# Patient Record
Sex: Male | Born: 1935 | Race: White | Hispanic: No | Marital: Single | Smoking: Former smoker
Health system: Southern US, Community
[De-identification: ages and names within clinical notes are randomized; demographics above are authoritative.]

## PROBLEM LIST (undated history)

## (undated) DIAGNOSIS — E785 Hyperlipidemia, unspecified: Secondary | ICD-10-CM

## (undated) DIAGNOSIS — I509 Heart failure, unspecified: Secondary | ICD-10-CM

## (undated) DIAGNOSIS — E669 Obesity, unspecified: Secondary | ICD-10-CM

## (undated) DIAGNOSIS — I10 Essential (primary) hypertension: Secondary | ICD-10-CM

## (undated) DIAGNOSIS — E079 Disorder of thyroid, unspecified: Secondary | ICD-10-CM

## (undated) DIAGNOSIS — K828 Other specified diseases of gallbladder: Secondary | ICD-10-CM

## (undated) DIAGNOSIS — I2510 Atherosclerotic heart disease of native coronary artery without angina pectoris: Secondary | ICD-10-CM

## (undated) DIAGNOSIS — K219 Gastro-esophageal reflux disease without esophagitis: Secondary | ICD-10-CM

## (undated) DIAGNOSIS — R911 Solitary pulmonary nodule: Secondary | ICD-10-CM

## (undated) HISTORY — DX: Atherosclerotic heart disease of native coronary artery without angina pectoris: I25.10

## (undated) HISTORY — DX: Gastro-esophageal reflux disease without esophagitis: K21.9

## (undated) HISTORY — DX: Disorder of thyroid, unspecified: E07.9

## (undated) HISTORY — DX: Other specified diseases of gallbladder: K82.8

## (undated) HISTORY — DX: Obesity, unspecified: E66.9

## (undated) HISTORY — DX: Heart failure, unspecified: I50.9

## (undated) HISTORY — DX: Solitary pulmonary nodule: R91.1

## (undated) HISTORY — DX: Hyperlipidemia, unspecified: E78.5

## (undated) HISTORY — DX: Essential (primary) hypertension: I10

---

## 1998-06-05 ENCOUNTER — Encounter: Payer: Self-pay | Admitting: Cardiology

## 1998-06-05 ENCOUNTER — Inpatient Hospital Stay (HOSPITAL_COMMUNITY): Admission: AD | Admit: 1998-06-05 | Discharge: 1998-06-14 | Payer: Self-pay | Admitting: Cardiology

## 1998-06-07 ENCOUNTER — Encounter: Payer: Self-pay | Admitting: Thoracic Surgery (Cardiothoracic Vascular Surgery)

## 1998-06-08 ENCOUNTER — Encounter: Payer: Self-pay | Admitting: Thoracic Surgery (Cardiothoracic Vascular Surgery)

## 1998-06-12 ENCOUNTER — Encounter: Payer: Self-pay | Admitting: Thoracic Surgery (Cardiothoracic Vascular Surgery)

## 1998-07-02 ENCOUNTER — Inpatient Hospital Stay (HOSPITAL_COMMUNITY): Admission: EM | Admit: 1998-07-02 | Discharge: 1998-07-05 | Payer: Self-pay | Admitting: Emergency Medicine

## 1998-07-02 ENCOUNTER — Encounter: Payer: Self-pay | Admitting: Emergency Medicine

## 1998-07-04 ENCOUNTER — Encounter: Payer: Self-pay | Admitting: Cardiology

## 2001-04-29 ENCOUNTER — Encounter: Payer: Self-pay | Admitting: Emergency Medicine

## 2001-04-29 ENCOUNTER — Emergency Department (HOSPITAL_COMMUNITY): Admission: EM | Admit: 2001-04-29 | Discharge: 2001-04-30 | Payer: Self-pay | Admitting: Emergency Medicine

## 2002-11-29 ENCOUNTER — Encounter: Payer: Self-pay | Admitting: Emergency Medicine

## 2002-11-29 ENCOUNTER — Inpatient Hospital Stay (HOSPITAL_COMMUNITY): Admission: EM | Admit: 2002-11-29 | Discharge: 2002-12-05 | Payer: Self-pay | Admitting: Emergency Medicine

## 2002-11-29 ENCOUNTER — Encounter: Payer: Self-pay | Admitting: Cardiology

## 2003-08-12 ENCOUNTER — Emergency Department (HOSPITAL_COMMUNITY): Admission: AD | Admit: 2003-08-12 | Discharge: 2003-08-13 | Payer: Self-pay | Admitting: Emergency Medicine

## 2004-06-26 ENCOUNTER — Inpatient Hospital Stay (HOSPITAL_BASED_OUTPATIENT_CLINIC_OR_DEPARTMENT_OTHER): Admission: RE | Admit: 2004-06-26 | Discharge: 2004-06-26 | Payer: Self-pay | Admitting: *Deleted

## 2004-09-22 ENCOUNTER — Ambulatory Visit: Payer: Self-pay | Admitting: Cardiology

## 2005-03-23 ENCOUNTER — Ambulatory Visit: Payer: Self-pay | Admitting: Cardiology

## 2005-04-02 ENCOUNTER — Ambulatory Visit: Payer: Self-pay

## 2006-04-30 ENCOUNTER — Ambulatory Visit: Payer: Self-pay | Admitting: Cardiology

## 2006-12-15 ENCOUNTER — Emergency Department (HOSPITAL_COMMUNITY): Admission: EM | Admit: 2006-12-15 | Discharge: 2006-12-16 | Payer: Self-pay | Admitting: Emergency Medicine

## 2007-01-05 ENCOUNTER — Ambulatory Visit: Payer: Self-pay | Admitting: Cardiology

## 2007-01-13 ENCOUNTER — Ambulatory Visit: Payer: Self-pay | Admitting: Critical Care Medicine

## 2007-01-17 ENCOUNTER — Ambulatory Visit: Payer: Self-pay | Admitting: Cardiology

## 2007-01-20 ENCOUNTER — Encounter: Payer: Self-pay | Admitting: Cardiology

## 2007-01-20 ENCOUNTER — Ambulatory Visit: Payer: Self-pay

## 2007-01-31 ENCOUNTER — Ambulatory Visit: Payer: Self-pay | Admitting: Gastroenterology

## 2007-02-01 ENCOUNTER — Ambulatory Visit: Payer: Self-pay | Admitting: Cardiology

## 2007-04-26 ENCOUNTER — Ambulatory Visit: Payer: Self-pay | Admitting: Cardiology

## 2007-09-05 DIAGNOSIS — I2510 Atherosclerotic heart disease of native coronary artery without angina pectoris: Secondary | ICD-10-CM

## 2007-09-05 HISTORY — PX: CORONARY ARTERY BYPASS GRAFT: SHX141

## 2007-09-05 HISTORY — DX: Atherosclerotic heart disease of native coronary artery without angina pectoris: I25.10

## 2007-09-08 ENCOUNTER — Ambulatory Visit: Payer: Self-pay | Admitting: Cardiovascular Disease

## 2007-09-08 LAB — CONVERTED CEMR LAB
Basophils Absolute: 0 10*3/uL (ref 0.0–0.1)
Eosinophils Absolute: 0.2 10*3/uL (ref 0.0–0.6)
Eosinophils Relative: 1.2 % (ref 0.0–5.0)
GFR calc non Af Amer: 79 mL/min
Glucose, Bld: 109 mg/dL — ABNORMAL HIGH (ref 70–99)
HCT: 43.2 % (ref 39.0–52.0)
Hemoglobin: 15 g/dL (ref 13.0–17.0)
Lymphocytes Relative: 12.7 % (ref 12.0–46.0)
MCHC: 34.8 g/dL (ref 30.0–36.0)
MCV: 89.3 fL (ref 78.0–100.0)
Monocytes Absolute: 0.5 10*3/uL (ref 0.2–0.7)
Neutrophils Relative %: 82.2 % — ABNORMAL HIGH (ref 43.0–77.0)
Potassium: 4.2 meq/L (ref 3.5–5.1)
Sodium: 137 meq/L (ref 135–145)
WBC: 12.7 10*3/uL — ABNORMAL HIGH (ref 4.5–10.5)
aPTT: 41.1 s — ABNORMAL HIGH (ref 21.7–29.8)

## 2007-09-09 ENCOUNTER — Inpatient Hospital Stay (HOSPITAL_COMMUNITY): Admission: RE | Admit: 2007-09-09 | Discharge: 2007-09-12 | Payer: Self-pay | Admitting: Cardiology

## 2007-09-09 ENCOUNTER — Ambulatory Visit: Payer: Self-pay | Admitting: Cardiology

## 2007-09-09 ENCOUNTER — Inpatient Hospital Stay (HOSPITAL_BASED_OUTPATIENT_CLINIC_OR_DEPARTMENT_OTHER): Admission: RE | Admit: 2007-09-09 | Discharge: 2007-09-09 | Payer: Self-pay | Admitting: Cardiology

## 2007-09-21 ENCOUNTER — Ambulatory Visit: Payer: Self-pay

## 2007-10-24 ENCOUNTER — Ambulatory Visit: Payer: Self-pay | Admitting: Cardiology

## 2007-10-24 LAB — CONVERTED CEMR LAB
BUN: 33 mg/dL — ABNORMAL HIGH (ref 6–23)
Basophils Absolute: 0 10*3/uL (ref 0.0–0.1)
Basophils Relative: 0.3 % (ref 0.0–1.0)
CO2: 23 meq/L (ref 19–32)
Calcium: 9.8 mg/dL (ref 8.4–10.5)
GFR calc Af Amer: 95 mL/min
Lymphocytes Relative: 14 % (ref 12.0–46.0)
MCHC: 35 g/dL (ref 30.0–36.0)
Monocytes Absolute: 0.5 10*3/uL (ref 0.2–0.7)
Monocytes Relative: 4.6 % (ref 3.0–11.0)
Neutro Abs: 8.6 10*3/uL — ABNORMAL HIGH (ref 1.4–7.7)
Platelets: 254 10*3/uL (ref 150–400)
Potassium: 4.8 meq/L (ref 3.5–5.1)
RDW: 12.7 % (ref 11.5–14.6)

## 2007-11-02 ENCOUNTER — Encounter: Payer: Self-pay | Admitting: Cardiology

## 2007-11-02 ENCOUNTER — Ambulatory Visit: Payer: Self-pay

## 2008-01-12 ENCOUNTER — Ambulatory Visit: Payer: Self-pay | Admitting: Cardiology

## 2008-05-04 ENCOUNTER — Ambulatory Visit: Payer: Self-pay | Admitting: Cardiology

## 2008-06-27 ENCOUNTER — Ambulatory Visit: Payer: Self-pay | Admitting: Cardiology

## 2008-06-27 LAB — CONVERTED CEMR LAB
ALT: 14 units/L (ref 0–53)
AST: 15 units/L (ref 0–37)
BUN: 28 mg/dL — ABNORMAL HIGH (ref 6–23)
Basophils Absolute: 0.1 10*3/uL (ref 0.0–0.1)
Basophils Relative: 0.6 % (ref 0.0–3.0)
CO2: 25 meq/L (ref 19–32)
Calcium: 9.9 mg/dL (ref 8.4–10.5)
Chloride: 108 meq/L (ref 96–112)
Creatinine, Ser: 1 mg/dL (ref 0.4–1.5)
Eosinophils Relative: 3.4 % (ref 0.0–5.0)
Glucose, Bld: 111 mg/dL — ABNORMAL HIGH (ref 70–99)
Hemoglobin: 15.3 g/dL (ref 13.0–17.0)
Lymphocytes Relative: 15.5 % (ref 12.0–46.0)
MCHC: 34.4 g/dL (ref 30.0–36.0)
Monocytes Relative: 4 % (ref 3.0–12.0)
Neutro Abs: 7.7 10*3/uL (ref 1.4–7.7)
Neutrophils Relative %: 76.5 % (ref 43.0–77.0)
RBC: 4.8 M/uL (ref 4.22–5.81)
TSH: 2.08 microintl units/mL (ref 0.35–5.50)
Total Bilirubin: 1 mg/dL (ref 0.3–1.2)
Total Protein: 7.6 g/dL (ref 6.0–8.3)
WBC: 10.1 10*3/uL (ref 4.5–10.5)

## 2008-07-06 ENCOUNTER — Ambulatory Visit: Payer: Self-pay | Admitting: Cardiology

## 2009-03-05 DIAGNOSIS — E785 Hyperlipidemia, unspecified: Secondary | ICD-10-CM

## 2009-03-05 DIAGNOSIS — I25810 Atherosclerosis of coronary artery bypass graft(s) without angina pectoris: Secondary | ICD-10-CM | POA: Insufficient documentation

## 2009-03-05 DIAGNOSIS — I5022 Chronic systolic (congestive) heart failure: Secondary | ICD-10-CM | POA: Insufficient documentation

## 2009-03-05 DIAGNOSIS — I10 Essential (primary) hypertension: Secondary | ICD-10-CM

## 2009-03-05 DIAGNOSIS — E669 Obesity, unspecified: Secondary | ICD-10-CM | POA: Insufficient documentation

## 2009-03-05 DIAGNOSIS — I2589 Other forms of chronic ischemic heart disease: Secondary | ICD-10-CM

## 2009-03-06 ENCOUNTER — Encounter: Payer: Self-pay | Admitting: Cardiology

## 2009-03-06 ENCOUNTER — Ambulatory Visit: Payer: Self-pay

## 2009-03-06 ENCOUNTER — Ambulatory Visit: Payer: Self-pay | Admitting: Cardiology

## 2009-03-07 ENCOUNTER — Encounter: Payer: Self-pay | Admitting: Cardiology

## 2009-03-08 ENCOUNTER — Telehealth: Payer: Self-pay | Admitting: Cardiology

## 2009-03-11 ENCOUNTER — Telehealth: Payer: Self-pay | Admitting: Cardiology

## 2009-03-12 ENCOUNTER — Ambulatory Visit: Payer: Self-pay | Admitting: Cardiology

## 2009-03-12 DIAGNOSIS — E875 Hyperkalemia: Secondary | ICD-10-CM

## 2009-03-12 LAB — CONVERTED CEMR LAB
CO2: 27 meq/L (ref 19–32)
Calcium: 9.8 mg/dL (ref 8.4–10.5)
Creatinine, Ser: 1.2 mg/dL (ref 0.4–1.5)
GFR calc non Af Amer: 63.19 mL/min (ref >60)
Sodium: 142 meq/L (ref 135–145)

## 2009-05-21 ENCOUNTER — Telehealth (INDEPENDENT_AMBULATORY_CARE_PROVIDER_SITE_OTHER): Payer: Self-pay | Admitting: *Deleted

## 2009-05-29 ENCOUNTER — Ambulatory Visit: Payer: Self-pay | Admitting: Internal Medicine

## 2009-05-29 ENCOUNTER — Inpatient Hospital Stay (HOSPITAL_COMMUNITY): Admission: EM | Admit: 2009-05-29 | Discharge: 2009-06-05 | Payer: Self-pay | Admitting: Emergency Medicine

## 2009-05-30 ENCOUNTER — Ambulatory Visit: Payer: Self-pay | Admitting: Cardiovascular Disease

## 2009-05-30 ENCOUNTER — Encounter: Payer: Self-pay | Admitting: Cardiology

## 2009-05-31 ENCOUNTER — Other Ambulatory Visit: Payer: Self-pay | Admitting: Cardiology

## 2009-06-01 ENCOUNTER — Other Ambulatory Visit: Payer: Self-pay | Admitting: Cardiology

## 2009-06-02 ENCOUNTER — Other Ambulatory Visit: Payer: Self-pay | Admitting: Cardiology

## 2009-06-03 ENCOUNTER — Other Ambulatory Visit: Payer: Self-pay | Admitting: Cardiology

## 2009-06-03 ENCOUNTER — Encounter: Payer: Self-pay | Admitting: Internal Medicine

## 2009-06-04 ENCOUNTER — Other Ambulatory Visit: Payer: Self-pay | Admitting: Cardiology

## 2009-06-05 ENCOUNTER — Other Ambulatory Visit: Payer: Self-pay | Admitting: Cardiology

## 2009-06-05 HISTORY — PX: CARDIAC CATHETERIZATION: SHX172

## 2009-06-12 ENCOUNTER — Telehealth: Payer: Self-pay | Admitting: Cardiology

## 2009-06-21 ENCOUNTER — Ambulatory Visit: Payer: Self-pay | Admitting: Cardiology

## 2009-06-21 ENCOUNTER — Encounter (INDEPENDENT_AMBULATORY_CARE_PROVIDER_SITE_OTHER): Payer: Self-pay | Admitting: *Deleted

## 2009-07-10 ENCOUNTER — Encounter: Payer: Self-pay | Admitting: Internal Medicine

## 2009-07-12 ENCOUNTER — Ambulatory Visit: Payer: Self-pay | Admitting: Internal Medicine

## 2009-07-12 DIAGNOSIS — I498 Other specified cardiac arrhythmias: Secondary | ICD-10-CM | POA: Insufficient documentation

## 2009-09-09 ENCOUNTER — Ambulatory Visit: Payer: Self-pay | Admitting: Cardiology

## 2010-01-14 ENCOUNTER — Encounter: Payer: Self-pay | Admitting: Cardiology

## 2010-01-20 ENCOUNTER — Ambulatory Visit: Payer: Self-pay | Admitting: Cardiology

## 2010-05-08 ENCOUNTER — Ambulatory Visit: Payer: Self-pay | Admitting: Cardiology

## 2010-09-08 ENCOUNTER — Encounter: Payer: Self-pay | Admitting: Cardiology

## 2010-09-08 ENCOUNTER — Ambulatory Visit: Payer: Self-pay | Admitting: Cardiology

## 2010-09-17 ENCOUNTER — Telehealth (INDEPENDENT_AMBULATORY_CARE_PROVIDER_SITE_OTHER): Payer: Self-pay | Admitting: *Deleted

## 2010-09-18 ENCOUNTER — Encounter: Payer: Self-pay | Admitting: Cardiovascular Disease

## 2010-09-18 ENCOUNTER — Ambulatory Visit: Payer: Self-pay

## 2010-09-18 ENCOUNTER — Encounter (HOSPITAL_COMMUNITY): Admission: RE | Admit: 2010-09-18 | Payer: Self-pay | Source: Home / Self Care | Admitting: Cardiology

## 2010-09-18 ENCOUNTER — Encounter: Payer: Self-pay | Admitting: Cardiology

## 2010-09-18 ENCOUNTER — Ambulatory Visit (HOSPITAL_COMMUNITY)
Admission: RE | Admit: 2010-09-18 | Discharge: 2010-09-18 | Payer: Self-pay | Source: Home / Self Care | Attending: Cardiology | Admitting: Cardiology

## 2010-09-18 ENCOUNTER — Encounter (HOSPITAL_COMMUNITY)
Admission: RE | Admit: 2010-09-18 | Discharge: 2010-11-04 | Payer: Self-pay | Source: Home / Self Care | Attending: Cardiology | Admitting: Cardiology

## 2010-09-18 ENCOUNTER — Ambulatory Visit: Payer: Self-pay | Admitting: Cardiology

## 2010-09-19 LAB — CONVERTED CEMR LAB
Basophils Absolute: 0.1 10*3/uL (ref 0.0–0.1)
Basophils Relative: 0.5 % (ref 0.0–3.0)
Calcium: 10.4 mg/dL (ref 8.4–10.5)
Eosinophils Absolute: 0.8 10*3/uL — ABNORMAL HIGH (ref 0.0–0.7)
GFR calc non Af Amer: 54.01 mL/min — ABNORMAL LOW (ref >60.00)
Glucose, Bld: 96 mg/dL (ref 70–99)
Lymphocytes Relative: 20.4 % (ref 12.0–46.0)
MCHC: 34.2 g/dL (ref 30.0–36.0)
Neutrophils Relative %: 65.8 % (ref 43.0–77.0)
RBC: 4.76 M/uL (ref 4.22–5.81)
RDW: 14 % (ref 11.5–14.6)
Sodium: 139 meq/L (ref 135–145)

## 2010-09-23 ENCOUNTER — Encounter: Payer: Self-pay | Admitting: Cardiology

## 2010-09-23 ENCOUNTER — Encounter (INDEPENDENT_AMBULATORY_CARE_PROVIDER_SITE_OTHER): Payer: Self-pay | Admitting: *Deleted

## 2010-09-23 ENCOUNTER — Telehealth: Payer: Self-pay | Admitting: Cardiology

## 2010-09-23 DIAGNOSIS — R9430 Abnormal result of cardiovascular function study, unspecified: Secondary | ICD-10-CM | POA: Insufficient documentation

## 2010-09-24 ENCOUNTER — Ambulatory Visit: Payer: Self-pay | Admitting: Cardiology

## 2010-09-24 ENCOUNTER — Ambulatory Visit (HOSPITAL_COMMUNITY)
Admission: RE | Admit: 2010-09-24 | Discharge: 2010-09-24 | Payer: Self-pay | Source: Home / Self Care | Attending: Cardiology | Admitting: Cardiology

## 2010-09-24 LAB — CONVERTED CEMR LAB
Basophils Relative: 0.5 % (ref 0.0–3.0)
Chloride: 101 meq/L (ref 96–112)
Eosinophils Relative: 6.6 % — ABNORMAL HIGH (ref 0.0–5.0)
HCT: 40.8 % (ref 39.0–52.0)
Hemoglobin: 13.9 g/dL (ref 13.0–17.0)
Lymphs Abs: 2 10*3/uL (ref 0.7–4.0)
MCV: 89.5 fL (ref 78.0–100.0)
Monocytes Absolute: 0.4 10*3/uL (ref 0.1–1.0)
Potassium: 4.7 meq/L (ref 3.5–5.1)
RBC: 4.56 M/uL (ref 4.22–5.81)
Sodium: 137 meq/L (ref 135–145)
WBC: 8.6 10*3/uL (ref 4.5–10.5)

## 2010-09-26 ENCOUNTER — Ambulatory Visit
Admission: RE | Admit: 2010-09-26 | Payer: Self-pay | Source: Home / Self Care | Attending: Cardiology | Admitting: Cardiology

## 2010-09-30 ENCOUNTER — Encounter: Payer: Self-pay | Admitting: Cardiology

## 2010-09-30 ENCOUNTER — Encounter (INDEPENDENT_AMBULATORY_CARE_PROVIDER_SITE_OTHER): Payer: Self-pay | Admitting: *Deleted

## 2010-09-30 ENCOUNTER — Ambulatory Visit: Payer: Self-pay | Admitting: Cardiology

## 2010-10-26 ENCOUNTER — Encounter: Payer: Self-pay | Admitting: Critical Care Medicine

## 2010-10-28 ENCOUNTER — Ambulatory Visit: Admission: RE | Admit: 2010-10-28 | Discharge: 2010-10-28 | Payer: Self-pay | Source: Home / Self Care

## 2010-10-28 DIAGNOSIS — I447 Left bundle-branch block, unspecified: Secondary | ICD-10-CM | POA: Insufficient documentation

## 2010-11-02 LAB — CONVERTED CEMR LAB
BUN: 58 mg/dL — ABNORMAL HIGH (ref 6–23)
BUN: 61 mg/dL — ABNORMAL HIGH (ref 6–23)
Basophils Absolute: 0.1 10*3/uL (ref 0.0–0.1)
Basophils Relative: 0.8 % (ref 0.0–3.0)
CO2: 27 meq/L (ref 19–32)
Chloride: 102 meq/L (ref 96–112)
Creatinine, Ser: 1.5 mg/dL (ref 0.4–1.5)
Creatinine, Ser: 1.6 mg/dL — ABNORMAL HIGH (ref 0.4–1.5)
Eosinophils Absolute: 1 10*3/uL — ABNORMAL HIGH (ref 0.0–0.7)
Eosinophils Relative: 9.6 % — ABNORMAL HIGH (ref 0.0–5.0)
GFR calc non Af Amer: 45.31 mL/min (ref >60)
Glucose, Bld: 95 mg/dL (ref 70–99)
HCT: 40 % (ref 39.0–52.0)
HCT: 41.9 % (ref 39.0–52.0)
Hemoglobin: 13.5 g/dL (ref 13.0–17.0)
Lymphs Abs: 1.9 10*3/uL (ref 0.7–4.0)
MCHC: 33.7 g/dL (ref 30.0–36.0)
MCV: 92.3 fL (ref 78.0–100.0)
Monocytes Absolute: 0.6 10*3/uL (ref 0.1–1.0)
Monocytes Relative: 5.3 % (ref 3.0–12.0)
Monocytes Relative: 6.4 % (ref 3.0–12.0)
Neutro Abs: 7.2 10*3/uL (ref 1.4–7.7)
Neutrophils Relative %: 58.4 % (ref 43.0–77.0)
Platelets: 197 10*3/uL (ref 150.0–400.0)
Potassium: 4.7 meq/L (ref 3.5–5.1)
Potassium: 4.9 meq/L (ref 3.5–5.1)
RBC: 4.67 M/uL (ref 4.22–5.81)
RDW: 13.6 % (ref 11.5–14.6)
Sodium: 138 meq/L (ref 135–145)
WBC: 7.4 10*3/uL (ref 4.5–10.5)

## 2010-11-04 NOTE — Assessment & Plan Note (Signed)
Summary: f20m   Visit Type:  Follow-up Primary Provider:  Butler,MD  CC:  no complaints.  History of Present Illness: The patient is 76 years old and return for management of CAD and CHF he had remote bypass surgery and in 2008 had stenting of the saphenous vein graft to the right coronary artery. His ejection fraction improved following revascularization but in August of 2010 he developed recurrent CHF and his ejection fraction was back down to 20%. At that time he had 3 of 4 grafts patent.  He has been doing well recently over the last several months. He said no chest pain shortness of breath or fluid accumulation.  He did in the care that he had an episode of gout. His other problems include hyperlipidemia, GERD, and previous intolerance to beta blockers to slow heart rate.  Current Medications (verified): 1)  Furosemide 40 Mg Tabs (Furosemide) .... Take One Tablet By Mouth Daily. 2)  Adult Aspirin Low Strength 81 Mg Tbdp (Aspirin) .... Take One Tablet Once Daily 3)  Pravastatin Sodium 40 Mg Tabs (Pravastatin Sodium) .... Take One Half Tablet At Bedtime 4)  Plavix 75 Mg Tabs (Clopidogrel Bisulfate) .... Take One Tablet Once Daily 5)  Levothyroxine Sodium 112 Mcg Tabs (Levothyroxine Sodium) .... Take One Tablet Daily 6)  Lisinopril 20 Mg Tabs (Lisinopril) .... Take One Tablet Once Daily 7)  Gemfibrozil 600 Mg Tabs (Gemfibrozil) .... Take One Tablet Once Daily 8)  Omeprazole 20 Mg Cpdr (Omeprazole) .... Take One Tablet By Mouth Once Daily. 9)  Tylenol Extra Strength 500 Mg Tabs (Acetaminophen) .... As Needed 10)  Carvedilol 3.125 Mg Tabs (Carvedilol) .... Take One Tablet By Mouth Twice A Day 11)  Nitrostat 0.4 Mg Subl (Nitroglycerin) .Marland Kitchen.. 1 Tablet Under Tongue At Onset of Chest Pain; You May Repeat Every 5 Minutes For Up To 3 Doses.  Allergies (verified): 1)  ! Codeine 2)  ! Celebrex 3)  ! * Ivp Dye  Past History:  Past Medical History: Reviewed history from 06/20/2009 and no  changes required.  PAST MEDICAL HISTORY:    1. GERD 2. Coronary artery disease status post remote coronary bypass surgery     and status post placement of a drug-eluting stent to the saphenous     vein graft to the right coronary artery in December 2008 with 3/4 grafts patent at cath 06/2009. 3. Systolic heart failure Adx 06/2009 4. Ejection fraction of 20% by cath 06/2009 5. Hypertension. 6. Hyperlipidemia. 7. Gastroesophageal reflux disease. 8. Intolerance to beta-blockers due to bradycardia.   7. Hypothyroidism.   8. Obesity.   9. Right lung nodule.   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   Vital Signs:  Patient profile:   76 year old male Height:      69 inches Weight:      203 pounds BMI:     30.09 Pulse rate:   49 / minute BP sitting:   124 / 59  (left arm) Cuff size:   regular  Vitals Entered By: Burnett Kanaris, CNA (January 20, 2010 8:45 AM)  Physical Exam  Additional Exam:  Gen. Well-nourished, in no distress   Neck: No JVD, thyroid not enlarged, no carotid bruits Lungs: No tachypnea, clear without rales, rhonchi or wheezes Cardiovascular: Rhythm regular, PMI not displaced,  heart sounds  normal, no murmurs or gallops, no peripheral edema,  pulses normal in all 4 extremities. Abdomen: BS normal, abdomen soft and non-tender without masses or organomegaly, no hepatosplenomegaly. MS: No deformities, no cyanosis or clubbing   Neuro:  No focal sns   Skin:  no lesions    Impression & Recommendations:  Problem # 1:  CAD, AUTOLOGOUS BYPASS GRAFT (ICD-414.02) He has had previous bypass surgery and previous PCI as described in the history of present illness. He has had no chest pain this problem appears stable.  His heart rate was 48 today we will decrease his carvedilol from 3.125 mg b.i.d. to q.d.  His updated medication list for this problem includes:     Adult Aspirin Low Strength 81 Mg Tbdp (Aspirin) .Marland Kitchen... Take one tablet once daily    Plavix 75 Mg Tabs (Clopidogrel bisulfate) .Marland Kitchen... Take one tablet once daily    Lisinopril 20 Mg Tabs (Lisinopril) .Marland Kitchen... Take one tablet once daily    Carvedilol 3.125 Mg Tabs (Carvedilol) .Marland Kitchen... Take one tablet by mouth once daily    Nitrostat 0.4 Mg Subl (Nitroglycerin) .Marland Kitchen... 1 tablet under tongue at onset of chest pain; you may repeat every 5 minutes for up to 3 doses.  His updated medication list for this problem includes:    Adult Aspirin Low Strength 81 Mg Tbdp (Aspirin) .Marland Kitchen... Take one tablet once daily    Plavix 75 Mg Tabs (Clopidogrel bisulfate) .Marland Kitchen... Take one tablet once daily    Lisinopril 20 Mg Tabs (Lisinopril) .Marland Kitchen... Take one tablet once daily    Carvedilol 3.125 Mg Tabs (Carvedilol) .Marland Kitchen... Take one tablet by mouth twice a day    Nitrostat 0.4 Mg Subl (Nitroglycerin) .Marland Kitchen... 1 tablet under tongue at onset of chest pain; you may repeat every 5 minutes for up to 3 doses.  Orders: EKG w/ Interpretation (93000)  Problem # 2:  SYSTOLIC HEART FAILURE, CHRONIC (ICD-428.22) He has a history of systolic CHF with an ejection fraction of 20%. He appears euvolemic today. We will continue current therapy. His updated medication list for this problem includes:    Furosemide 40 Mg Tabs (Furosemide) .Marland Kitchen... Take one tablet by mouth daily.    Adult Aspirin Low Strength 81 Mg Tbdp (Aspirin) .Marland Kitchen... Take one tablet once daily    Plavix 75 Mg Tabs (Clopidogrel bisulfate) .Marland Kitchen... Take one tablet once daily    Lisinopril 20 Mg Tabs (Lisinopril) .Marland Kitchen... Take one tablet once daily    Carvedilol 3.125 Mg Tabs (Carvedilol) .Marland Kitchen... Take one tablet by mouth once daily    Nitrostat 0.4 Mg Subl (Nitroglycerin) .Marland Kitchen... 1 tablet under tongue at onset of chest pain; you may repeat every 5 minutes for up to 3 doses.  Problem # 3:  HYPERLIPIDEMIA (ICD-272.4) He had recent laboratory studies with Dr. Charm Barges and we will obtain those results. His  updated medication list for this problem includes:    Pravastatin Sodium 40 Mg Tabs (Pravastatin sodium) .Marland Kitchen... Take one half tablet at bedtime    Gemfibrozil 600 Mg Tabs (Gemfibrozil) .Marland Kitchen... Take one tablet once daily  His updated medication list for this problem includes:    Pravastatin Sodium 40 Mg Tabs (Pravastatin sodium) .Marland Kitchen... Take one half tablet at bedtime    Gemfibrozil 600 Mg Tabs (Gemfibrozil) .Marland Kitchen... Take one tablet once daily  Patient Instructions: 1)  Your physician recommends that you schedule a follow-up appointment in: 4 months. 2)  Your physician has recommended you make the following change in your medication: 1) decrease carvedilol to 3.125mg  once daily

## 2010-11-04 NOTE — Assessment & Plan Note (Signed)
Summary: James [name]   Visit Type:  Follow-up Primary Basil Buffin:  Butler,MD  CC:  none.  History of Present Illness: The patient is 76 years old and return for management of CAD and CHF he had remote bypass surgery and in 2008 had stenting of the saphenous vein graft to the right coronary artery. His ejection fraction improved following revascularization but in August of 2010 he developed recurrent CHF and his ejection fraction was back down to 20%. At that time he had 3 of 4 grafts patent.  He has been doing well recently over the last several months. He said no chest pain shortness of breath or fluid accumulation.  He did in the care that he had an episode of gout. His other problems include hyperlipidemia, GERD, and previous intolerance to beta blockers to slow heart rate.  we cut his Coreg to 3.125 mg b.i.d. last visit because of a slow heart rate.  He lives with one of his daughters in [place]. Some other family members see Dr. Andee Lineman and they would like to see him in followup next year.  Current Medications (verified): 1)  Furosemide 40 Mg Tabs (Furosemide) .... Take One Tablet By Mouth Daily. 2)  Adult Aspirin Low Strength 81 Mg Tbdp (Aspirin) .... Take One Tablet Once Daily 3)  Pravastatin Sodium 40 Mg Tabs (Pravastatin Sodium) .... Take One Half Tablet At Bedtime 4)  Plavix 75 Mg Tabs (Clopidogrel Bisulfate) .... Take One Tablet Once Daily 5)  Levothyroxine Sodium 112 Mcg Tabs (Levothyroxine Sodium) .... Take One Tablet Daily 6)  Lisinopril 20 Mg Tabs (Lisinopril) .... Take One Tablet Once Daily 7)  Gemfibrozil 600 Mg Tabs (Gemfibrozil) .... Take One Tablet Once Daily 8)  Omeprazole 20 Mg Cpdr (Omeprazole) .... Take One Tablet By Mouth Once Daily. 9)  Tylenol Extra Strength 500 Mg Tabs (Acetaminophen) .... As Needed (Arthritis) 10)  Carvedilol 3.125 Mg Tabs (Carvedilol) .... Take 1/2 Tablet By Mouth Once Daily 11)  Nitrostat 0.4 Mg Subl (Nitroglycerin) .Marland Kitchen.. 1 Tablet Under Tongue At  Onset of Chest Pain; You May Repeat Every 5 Minutes For Up To 3 Doses.  Allergies (verified): 1)  ! Codeine 2)  ! Celebrex 3)  ! * Ivp Dye  Past History:  Past Medical History: Reviewed history from 06/20/2009 and no changes required.  PAST MEDICAL HISTORY:    1. GERD 2. Coronary artery disease status post remote coronary bypass surgery     and status post placement of a drug-eluting stent to the saphenous     vein graft to the right coronary artery in December 2008 with 3/4 grafts patent at cath 06/2009. 3. Systolic heart failure Adx 06/2009 4. Ejection fraction of 20% by cath 06/2009 5. Hypertension. 6. Hyperlipidemia. 7. Gastroesophageal reflux disease. 8. Intolerance to beta-blockers due to bradycardia.   7. Hypothyroidism.   8. Obesity.   9. Right lung nodule.   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   Vital Signs:  Patient profile:   76 year old male Height:      69 inches Weight:      196 pounds BMI:     29.05 Pulse rate:   48 / minute BP sitting:   122 / 61  (left arm)  Vitals Entered By: Burnett Kanaris, CNA (May 08, 2010 8:47 AM)  Physical Exam  Additional Exam:  Gen. Well-nourished, in  no distress   Neck: No JVD, thyroid not enlarged, no carotid bruits Lungs: No tachypnea, clear without rales, rhonchi or wheezes Cardiovascular: Rhythm regular, PMI not displaced,  heart sounds  normal, no murmurs or gallops, no peripheral edema, pulses normal in all 4 extremities. Abdomen: BS normal, abdomen soft and non-tender without masses or organomegaly, no hepatosplenomegaly. MS: No deformities, no cyanosis or clubbing   Neuro:  No focal sns   Skin:  no lesions    Impression & Recommendations:  Problem # 1:  SYSTOLIC HEART FAILURE, CHRONIC (ICD-428.22) He appears euvolemic today. He has had no increased shortness of breath. This prompted stable. His  updated medication list for this problem includes:    Furosemide 40 Mg Tabs (Furosemide) .Marland Kitchen... Take one tablet by mouth daily.    Adult Aspirin Low Strength 81 Mg Tbdp (Aspirin) .Marland Kitchen... Take one tablet once daily    Plavix 75 Mg Tabs (Clopidogrel bisulfate) .Marland Kitchen... Take one tablet once daily    Lisinopril 20 Mg Tabs (Lisinopril) .Marland Kitchen... Take one tablet once daily    Carvedilol 3.125 Mg Tabs (Carvedilol) .Marland Kitchen... Take 1/2 tablet by mouth once daily    Nitrostat 0.4 Mg Subl (Nitroglycerin) .Marland Kitchen... 1 tablet under tongue at onset of chest pain; you may repeat every 5 minutes for up to 3 doses.  Orders: EKG w/ Interpretation (93000) TLB-BMP (Basic Metabolic Panel-BMET) (80048-METABOL)  Problem # 2:  CAD, AUTOLOGOUS BYPASS GRAFT (ICD-414.02) He has had previous bypass surgery and previous PCI as described in the history of present illness. He's had no recent chest pain is from her stable. His updated medication list for this problem includes:    Adult Aspirin Low Strength 81 Mg Tbdp (Aspirin) .Marland Kitchen... Take one tablet once daily    Plavix 75 Mg Tabs (Clopidogrel bisulfate) .Marland Kitchen... Take one tablet once daily    Lisinopril 20 Mg Tabs (Lisinopril) .Marland Kitchen... Take one tablet once daily    Carvedilol 3.125 Mg Tabs (Carvedilol) .Marland Kitchen... Take 1/2 tablet by mouth once daily    Nitrostat 0.4 Mg Subl (Nitroglycerin) .Marland Kitchen... 1 tablet under tongue at onset of chest pain; you may repeat every 5 minutes for up to 3 doses.  Orders: EKG w/ Interpretation (93000) TLB-BMP (Basic Metabolic Panel-BMET) (80048-METABOL)  Problem # 3:  BRADYCARDIA (ICD-427.89) His heart rate is still slow but he said no symptoms related to this. We will continue his current dose of His updated medication list for this problem includes:    Adult Aspirin Low Strength 81 Mg Tbdp (Aspirin) .Marland Kitchen... Take one tablet once daily    Plavix 75 Mg Tabs (Clopidogrel bisulfate) .Marland Kitchen... Take one tablet once daily    Lisinopril 20 Mg Tabs (Lisinopril) .Marland Kitchen... Take one tablet once  daily    Carvedilol 3.125 Mg Tabs (Carvedilol) .Marland Kitchen... Take 1/2 tablet by mouth once daily    Nitrostat 0.4 Mg Subl (Nitroglycerin) .Marland Kitchen... 1 tablet under tongue at onset of chest pain; you may repeat every 5 minutes for up to 3 doses.  Patient Instructions: 1)  Your physician recommends that you schedule a follow-up appointment in: 4 months. 2)  Your physician recommends that you have lab work today: bmet (428.22;414.01) 3)  Your physician recommends that you continue on your current medications as directed. Please refer to the Current Medication list given to you today.

## 2010-11-04 NOTE — Assessment & Plan Note (Signed)
Summary: 4 month rov   Visit Type:  Follow-up Primary Provider:  Butler,MD  CC:  4 month ROV.  History of Present Illness: The patient is 76 years old and return for management of CAD and CHF he had remote bypass surgery and in 2008 had stenting of the saphenous vein graft to the right coronary artery. His ejection fraction improved following revascularization but in August of 2010 he developed recurrent CHF and his ejection fraction was back down to 20%. At that time he had 3 of 4 grafts patent.  He had been doing well since his last visit until about 2 weeks ago when he had a profound weak spell. His daughter checked his blood pressure and it was low and she checked an oxygen saturation and it was 88%. He related this to beginning colchicine for gout. He has a history of every time a new medicine is started he had some slurred reaction. He's had similar such reactions in the past but none recently.  There has been some discrepancy in his ejection fraction. In August 2000 having catheterization it was 20%.  Prior to that in June of 2010 he had an ejection fraction here in the office that was 40-45%.  He lives with one of his daughters in [place]. Some other family members see Dr. Andee Lineman and they would like to see him in followup next year.  Current Medications (verified): 1)  Furosemide 40 Mg Tabs (Furosemide) .... Take One Tablet By Mouth Daily. 2)  Adult Aspirin Low Strength 81 Mg Tbdp (Aspirin) .... Take One Tablet Once Daily 3)  Pravastatin Sodium 40 Mg Tabs (Pravastatin Sodium) .... Take One Tablet At Bedtime 4)  Plavix 75 Mg Tabs (Clopidogrel Bisulfate) .... Take One Tablet Once Daily 5)  Levothyroxine Sodium 112 Mcg Tabs (Levothyroxine Sodium) .... Take One Tablet Daily 6)  Lisinopril 40 Mg Tabs (Lisinopril) .... Take One Half Tablet By Mouth Daily 7)  Gemfibrozil 600 Mg Tabs (Gemfibrozil) .... Take One Tablet Once Daily 8)  Omeprazole 20 Mg Cpdr (Omeprazole) .... Take One Tablet  By Mouth Once Daily. 9)  Tylenol Extra Strength 500 Mg Tabs (Acetaminophen) .... As Needed (Arthritis) 10)  Carvedilol 6.25 Mg Tabs (Carvedilol) .... Take One Half Tablet By Mouth Twice A Day 11)  Nitrostat 0.4 Mg Subl (Nitroglycerin) .Marland Kitchen.. 1 Tablet Under Tongue At Onset of Chest Pain; You May Repeat Every 5 Minutes For Up To 3 Doses. 12)  Colchicine-Probenecid 0.5-500 Mg Tabs (Colchicine-Probenecid) .... Take One Half Tablet By Mouth Two Times A Day  Allergies: 1)  ! Codeine 2)  ! Celebrex 3)  ! * Ivp Dye  Past History:  Family History: Last updated: March 31, 2009 His mother died, possibly of a stroke, at age 94.  Father died at age 83 of unknown causes.   He thinks his maternal grandfather died of a stroke in his 72s or 77s.  102 year old sister with hyperlipidemia.      Social History: Last updated: 03-31-2009 Single -lives with sister Tobacco Use - Former.  Alcohol Use - no Regular Exercise - no Drug Use - no  Risk Factors: Exercise: no (Mar 31, 2009)  Risk Factors: Smoking Status: quit (2009/03/31)  Past Medical History: Reviewed history from 06/20/2009 and no changes required.  PAST MEDICAL HISTORY:    1. GERD 2. Coronary artery disease status post remote coronary bypass surgery     and status post placement of a drug-eluting stent to the saphenous     vein graft to the right coronary artery  in December 2008 with 3/4 grafts patent at cath 06/2009. 3. Systolic heart failure Adx 06/2009 4. Ejection fraction of 20% by cath 06/2009 5. Hypertension. 6. Hyperlipidemia. 7. Gastroesophageal reflux disease. 8. Intolerance to beta-blockers due to bradycardia.   7. Hypothyroidism.   8. Obesity.   9. Right lung nodule.   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   Vital Signs:  Patient profile:   76 year old male Height:      68 inches Weight:      200  pounds BMI:     30.52 Pulse rate:   48 / minute Pulse rhythm:   regular BP sitting:   120 / 60  (left arm) Cuff size:   regular  Vitals Entered By: Stanton Kidney, EMT-P (September 08, 2010 9:25 AM)  Physical Exam  Additional Exam:  Gen. Well-nourished, in no distress   Neck: No JVD, thyroid not enlarged, no carotid bruits Lungs: No tachypnea, clear without rales, rhonchi or wheezes Cardiovascular: Rhythm regular, PMI not displaced,  heart sounds  normal, no murmurs or gallops, no peripheral edema, pulses normal in all 4 extremities. Abdomen: BS normal, abdomen soft and non-tender without masses or organomegaly, no hepatosplenomegaly. MS: No deformities, no cyanosis or clubbing   Neuro:  No focal sns   Skin:  no lesions    Impression & Recommendations:  Problem # 1:  CAD, AUTOLOGOUS BYPASS GRAFT (ICD-414.02)  He has had remote bypass surgery and prior PCI procedures. His last catheterization in August of 2010 showed 3 of 4 grafts patent and ejection fraction of 20% with elevated filling pressures. He had a recent episode of profound weakness hypotension and hypoxia. Am concerned this could have been related to myocardial ischemia. We will plan further evaluation with a Myoview scan and an echocardiogram as well as laboratory studies today. The following medications were removed from the medication list:    Carvedilol 6.25 Mg Tabs (Carvedilol) .Marland Kitchen... Take one half tablet by mouth twice a day His updated medication list for this problem includes:    Adult Aspirin Low Strength 81 Mg Tbdp (Aspirin) .Marland Kitchen... Take one tablet once daily    Plavix 75 Mg Tabs (Clopidogrel bisulfate) .Marland Kitchen... Take one tablet once daily    Lisinopril 40 Mg Tabs (Lisinopril) .Marland Kitchen... Take one half tablet by mouth daily    Nitrostat 0.4 Mg Subl (Nitroglycerin) .Marland Kitchen... 1 tablet under tongue at onset of chest pain; you may repeat every 5 minutes for up to 3 doses.  His updated medication list for this problem includes:    Adult  Aspirin Low Strength 81 Mg Tbdp (Aspirin) .Marland Kitchen... Take one tablet once daily    Plavix 75 Mg Tabs (Clopidogrel bisulfate) .Marland Kitchen... Take one tablet once daily    Lisinopril 40 Mg Tabs (Lisinopril) .Marland Kitchen... Take one half tablet by mouth daily    Carvedilol 6.25 Mg Tabs (Carvedilol) .Marland Kitchen... Take one half tablet by mouth twice a day    Nitrostat 0.4 Mg Subl (Nitroglycerin) .Marland Kitchen... 1 tablet under tongue at onset of chest pain; you may repeat every 5 minutes for up to 3 doses.  Orders: EKG w/ Interpretation (93000) Echocardiogram (Echo) Nuclear Stress Test (Nuc Stress Test)  Problem # 2:  SYSTOLIC HEART FAILURE, CHRONIC (ICD-428.22)  He appears euvolemic today. It is probably stable. His pulses are low so will have to stop the carvedilol.  The following medications were removed from the medication list:    Carvedilol 6.25 Mg Tabs (Carvedilol) .Marland Kitchen... Take one half tablet by mouth twice a day His updated medication list for this problem includes:    Furosemide 40 Mg Tabs (Furosemide) .Marland Kitchen... Take one tablet by mouth daily.    Adult Aspirin Low Strength 81 Mg Tbdp (Aspirin) .Marland Kitchen... Take one tablet once daily    Plavix 75 Mg Tabs (Clopidogrel bisulfate) .Marland Kitchen... Take one tablet once daily    Lisinopril 40 Mg Tabs (Lisinopril) .Marland Kitchen... Take one half tablet by mouth daily    Nitrostat 0.4 Mg Subl (Nitroglycerin) .Marland Kitchen... 1 tablet under tongue at onset of chest pain; you may repeat every 5 minutes for up to 3 doses.  Orders: Echocardiogram (Echo) Nuclear Stress Test (Nuc Stress Test)  Problem # 3:  HYPERLIPIDEMIA (ICD-272.4) Stable His updated medication list for this problem includes:    Pravastatin Sodium 40 Mg Tabs (Pravastatin sodium) .Marland Kitchen... Take one tablet at bedtime    Gemfibrozil 600 Mg Tabs (Gemfibrozil) .Marland Kitchen... Take one tablet once daily  His updated medication list for this problem includes:    Pravastatin Sodium 40 Mg Tabs (Pravastatin sodium) .Marland Kitchen... Take one tablet at bedtime    Gemfibrozil 600 Mg Tabs  (Gemfibrozil) .Marland Kitchen... Take one tablet once daily  Patient Instructions: 1)  Labwork today: bmet/cbc/bnp (428.22;414.01). 2)  Stop Coreg (Carvedilol). 3)  Your physician has requested that you have an echocardiogram.  Echocardiography is a painless test that uses sound waves to create images of your heart. It provides your doctor with information about the size and shape of your heart and how well your heart's chambers and valves are working.  This procedure takes approximately one hour. There are no restrictions for this procedure. 4)  Your physician has requested that you have a Lexiscan myoview.  For further information please visit [email or web address]  Please follow instruction sheet, as given. 5)  Your physician recommends that you schedule a follow-up appointment in: 3 months with Dr. Lewayne Bunting in our [place] office.

## 2010-11-06 NOTE — Assessment & Plan Note (Signed)
Summary: eph   Visit Type:  Follow-up Primary Provider:  Butler,MD   History of Present Illness: The patient is 76 years old and return for management of CAD and CHF after his recent catheterization. He had remote bypass surgery and in 2008 had stenting of the saphenous vein graft to the right coronary artery. His ejection fraction improved following revascularization but in August of 2010 he developed recurrent CHF and his ejection fraction was back down to 20%. At that time he had 3 of 4 grafts patent.  He had been doing well since his last visit until about 2 weeks ago when he had a profound weak spell. His daughter checked his blood pressure and it was low and she checked an oxygen saturation and it was 88%. He related this to beginning colchicine for gout. He has a history of every time a new medicine is started he had some slurred reaction. He's had similar such reactions in the past but none recently.  We brought him to the hospital for catheterization and found 3 of 4 grafts to be open and an ejection fraction of 30% and a pulmonary wedge pressure of 9.  There has been some discrepancy in his ejection fraction. In August 2010 having catheterization it was 20%.  in December of 2010 he had an ejection fraction here in the office that was 40-45%.  his recent Myoview ejection fraction was 30% and a catheterization last week it was 30%.  He lives with one of his daughters in [place]. Some other family members see Dr. Andee Lineman and they would like to see him in followup next year.  Current Medications (verified): 1)  Furosemide 40 Mg Tabs (Furosemide) .... Take One Tablet By Mouth Daily. 2)  Adult Aspirin Low Strength 81 Mg Tbdp (Aspirin) .... Take One Tablet Once Daily 3)  Pravastatin Sodium 40 Mg Tabs (Pravastatin Sodium) .... Take One Tablet At Bedtime 4)  Plavix 75 Mg Tabs (Clopidogrel Bisulfate) .... Take One Tablet Once Daily 5)  Levothyroxine Sodium 112 Mcg Tabs (Levothyroxine Sodium)  .... Take One Tablet Daily 6)  Lisinopril 40 Mg Tabs (Lisinopril) .... Take 1/4  Tablet By Mouth Daily 7)  Gemfibrozil 600 Mg Tabs (Gemfibrozil) .... Take One Tablet Once Daily 8)  Omeprazole 20 Mg Cpdr (Omeprazole) .... Take One Tablet By Mouth Once Daily. 9)  Tylenol Extra Strength 500 Mg Tabs (Acetaminophen) .... As Needed (Arthritis) 10)  Nitrostat 0.4 Mg Subl (Nitroglycerin) .Marland Kitchen.. 1 Tablet Under Tongue At Onset of Chest Pain; You May Repeat Every 5 Minutes For Up To 3 Doses. 11)  Colchicine-Probenecid 0.5-500 Mg Tabs (Colchicine-Probenecid) .... Take One Half Tablet By Mouth Two Times A Day  Allergies: 1)  ! Codeine 2)  ! Celebrex 3)  ! * Ivp Dye  Past History:  Past Medical History: Reviewed history from 06/20/2009 and no changes required.  PAST MEDICAL HISTORY:    1. GERD 2. Coronary artery disease status post remote coronary bypass surgery     and status post placement of a drug-eluting stent to the saphenous     vein graft to the right coronary artery in December 2008 with 3/4 grafts patent at cath 06/2009. 3. Systolic heart failure Adx 06/2009 4. Ejection fraction of 20% by cath 06/2009 5. Hypertension. 6. Hyperlipidemia. 7. Gastroesophageal reflux disease. 8. Intolerance to beta-blockers due to bradycardia.   7. Hypothyroidism.   8. Obesity.   9. Right lung nodule.   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   Vital Signs:  Patient profile:   76 year old male Height:      68 inches Weight:      201 pounds BMI:     30.67 Pulse rate:   55 / minute BP sitting:   110 / 60  (left arm)  Vitals Entered By: Laurance Flatten CMA (September 30, 2010 4:31 PM)  Physical Exam  Additional Exam:  Gen. Well-nourished, in no distress   Neck: No JVD, thyroid not enlarged, no carotid bruits Lungs: No tachypnea, clear without rales, rhonchi or wheezes Cardiovascular:  Rhythm regular, PMI not displaced,  heart sounds  normal, no murmurs or gallops, no peripheral edema, pulses normal in all 4 extremities. Abdomen: BS normal, abdomen soft and non-tender without masses or organomegaly, no hepatosplenomegaly. MS: No deformities, no cyanosis or clubbing   Neuro:  No focal sns   Skin:  no lesions  The right groin site was well healed   Impression & Recommendations:  Problem # 1:  CAD, AUTOLOGOUS BYPASS GRAFT (ICD-414.02) He has had previous bypass surgery and previous PCI procedures. His anatomy at recent catheterization was stable and he said no chest pain. This problem appears stable. His updated medication list for this problem includes:    Adult Aspirin Low Strength 81 Mg Tbdp (Aspirin) .Marland Kitchen... Take one tablet once daily    Plavix 75 Mg Tabs (Clopidogrel bisulfate) .Marland Kitchen... Take one tablet once daily    Lisinopril 40 Mg Tabs (Lisinopril) .Marland Kitchen... Take 1/4  tablet by mouth daily    Nitrostat 0.4 Mg Subl (Nitroglycerin) .Marland Kitchen... 1 tablet under tongue at onset of chest pain; you may repeat every 5 minutes for up to 3 doses.  Problem # 2:  SYSTOLIC HEART FAILURE, CHRONIC (ICD-428.22)  He has an ischemic cardiomyopathy with an ejection fraction of 30% and chronic systolic CHF. He is usually making this appears compensated. He has had episodic weak spells. I think it is possible this could be due to ventricular arrhythmias. Regardless, he has an ejection fraction of 30% and he has a wide QRS of 146 ms. I think he may be a candidate for an ICD and biventricular pacemaker. We'll arrange for EP consultation. His updated medication list for this problem includes:    Furosemide 40 Mg Tabs (Furosemide) .Marland Kitchen... Take one tablet by mouth daily.    Adult Aspirin Low Strength 81 Mg Tbdp (Aspirin) .Marland Kitchen... Take one tablet once daily    Plavix 75 Mg Tabs (Clopidogrel bisulfate) .Marland Kitchen... Take one tablet once daily    Lisinopril 40 Mg Tabs (Lisinopril) .Marland Kitchen... Take 1/4  tablet by mouth daily     Nitrostat 0.4 Mg Subl (Nitroglycerin) .Marland Kitchen... 1 tablet under tongue at onset of chest pain; you may repeat every 5 minutes for up to 3 doses.  Orders: EP Referral (Cardiology EP Ref )  Problem # 3:  HYPERLIPIDEMIA (ICD-272.4) This is managed with the medications below. His updated medication list for this problem includes:    Pravastatin Sodium 40 Mg Tabs (Pravastatin sodium) .Marland Kitchen... Take one tablet at bedtime    Gemfibrozil 600 Mg Tabs (Gemfibrozil) .Marland Kitchen... Take one tablet once daily  Problem # 4:  ESSENTIAL HYPERTENSION, BENIGN (ICD-401.1) This is under good control with current medications. His updated medication list for this problem includes:    Furosemide 40 Mg Tabs (Furosemide) .Marland Kitchen... Take one tablet by mouth daily.    Adult  Aspirin Low Strength 81 Mg Tbdp (Aspirin) .Marland Kitchen... Take one tablet once daily    Lisinopril 40 Mg Tabs (Lisinopril) .Marland Kitchen... Take 1/4  tablet by mouth daily  Patient Instructions: 1)  We will refer you to Dr. Graciela Husbands for evaluation of a defibrillator. 2)  Your physician recommends that you schedule a follow-up appointment in: 3 months with Dr. Andee Lineman in our [place] office. 3)  Your physician recommends that you continue on your current medications as directed. Please refer to the Current Medication list given to you today.

## 2010-11-06 NOTE — Letter (Signed)
Summary: Pre-Cath Orders  Pre-Cath Orders   Imported By: Marylou Mccoy 10/14/2010 10:22:55  _____________________________________________________________________  External Attachment:    Type:   Image     Comment:   External Document

## 2010-11-06 NOTE — Letter (Signed)
Summary: Cardiac Catheterization Instructions- Main Lab  [facility], Main Office  [address]   [place], [place] [address]   Phone: [phone]  Fax: [phone]     09/30/2010 MRN: [id number]  James [name] [address] [place], [place]  [address]  Dear James [name],   You are scheduled for Cardiac Catheterization on  Tues. 10/07/10             with Dr. Excell Seltzer.  Please arrive at the [facility] at 8:30am      a.m. on the day of your procedure.  1. DIET     _x___ Nothing to eat or drink after midnight except your medications with a sip of water.  2. Come to the [place] office on   (09/30/10)          for lab work.  The lab at [facility] is open from 8:30 a.m. to 1:30 p.m. and 2:30 p.m. to 5:00 p.m.  The lab at [facility] is open from 7:30 a.m. to 5:30 p.m.  You do not have to be fasting.  3. MAKE SURE YOU TAKE YOUR ASPIRIN.  4. _____ DO NOT TAKE these medications before your procedure:         ________________________________________________________________________________      __x__ YOU MAY TAKE ALL of your remaining medications with a small amount of water.      ____ START NEW medications:     ________________________________________________________________________________      ____ Eilene Ghazi instructions:     ________________________________________________________________________________  5. Plan for one night stay - bring personal belongings (i.e. toothpaste, toothbrush, etc.)  6. Bring a current list of your medications and current insurance cards.  7. Must have a responsible person to drive you home.   8. Someone must be with you for the first 24 hours after you arrive home.  9. Please wear clothes that are easy to get on and off and wear slip-on shoes.  *Special note: Every effort is made to have your procedure done on time.  Occasionally there are emergencies that present themselves at the  hospital that may cause delays.  Please be patient if a delay does occur.  If you have any questions after you get home, please call the office at the number listed above.  Sherri Rad, RN, BSN  Appended Document: Cardiac Catheterization Instructions- Main Lab This was transcribed into this pt's chart in error. Cath instructions were for another pt.

## 2010-11-06 NOTE — Progress Notes (Signed)
Summary: cath instructions  Phone Note Outgoing Call   Call placed by: Sherri Rad, RN, BSN,  September 23, 2010 11:22 AM Call placed to: Patient Summary of Call: I left a message for the pt to call regarding his cath instructions.  Sherri Rad, RN, BSN  September 23, 2010 11:23 AM  I spoke with the pt. He will come tomorrow for labwork and pick up his cath instructions. He will have his chest x-ray done at [facility] on 12/21. I have left an order in the envelope with his pt. instructions. All of these have been placed at the front desk. Initial call taken by: Sherri Rad, RN, BSN,  September 23, 2010 1:16 PM  New Problems: NONSPECIFIC ABNORMAL UNSPEC CV FUNCTION STUDY (ICD-794.30)   New Problems: NONSPECIFIC ABNORMAL UNSPEC CV FUNCTION STUDY (ICD-794.30)  Appended Document: cath instructions    Clinical Lists Changes  Medications: Added new medication of PREDNISONE 20 MG TABS (PREDNISONE) take 3 tablets at 6:30pm the night prior to your procedure and take 3 tablets on arrival to the hospital. - Signed Rx of PREDNISONE 20 MG TABS (PREDNISONE) take 3 tablets at 6:30pm the night prior to your procedure and take 3 tablets on arrival to the hospital.;  #6 x 0;  Signed;  Entered by: Sherri Rad, RN, BSN;  Authorized by: Lenoria Farrier, MD, [facility];  Method used: Faxed to [name], [phone], [place], [place], [place]  [address], Ph: [phone] or [phone], Fax: [phone]    Prescriptions: PREDNISONE 20 MG TABS (PREDNISONE) take 3 tablets at 6:30pm the night prior to your procedure and take 3 tablets on arrival to the hospital.  #6 x 0   Entered by:   Sherri Rad, RN, BSN   Authorized by:   Lenoria Farrier, MD, [facility]   Signed by:   Sherri Rad, RN, BSN on 09/23/2010   Method used:   Faxed to ...       Hospital doctor (retail)       [address]       [place], [place]  [address]       Ph:  [phone] or [phone]       Fax: [phone]   RxID:   [id number]

## 2010-11-06 NOTE — Letter (Signed)
Summary: Cardiac Catheterization Instructions- JV Lab  [facility], Main Office  [address]   [place], [place] [address]   Phone: [phone]  Fax: [phone]     09/23/2010 MRN: [id number]  ROCK SOBOL [address] [place], [place]  [address]  Dear Mr. SUTTER, AHLGREN are scheduled for a Cardiac Catheterization on Friday 09/26/10 with Dr. Juanda Chance.  Please arrive to the 1st floor of the Heart and Vascular Center at [facility] at 9:30 am on the day of your procedure. Please do not arrive before 6:30 a.m. Call the Heart and Vascular Center at [phone] if you are unable to make your appointmnet. The Code to get into the parking garage under the building is 0003. Take the elevators to the 1st floor. You must have someone to drive you home. Someone must be with you for the first 24 hours after you arrive home. Please wear clothes that are easy to get on and off and wear slip-on shoes. Do not eat or drink after midnight except water with your medications that morning. Bring all your medications and current insurance cards with you.  _x__ DO NOT take these medications before your procedure: - hold furosemide the morning of your procedure.   _x__ Make sure you take your aspirin.  _x__ You may take ALL of your other medications with water that morning. ________________________________________________________________________________________________________________________________    x__ Eilene Ghazi instructions:  1) take prednisone 20mg  three tablets at 6:30pm the night prior to your procedure & take prednisone 20mg  three tablets on arrival to the hospital. 2) take pepcid 20mg  at 6:30pm the night prior to your procedure. 3) take benadryl 25mg  at 6:30pm the night prior to your procedure.   The usual length of stay after your procedure is 2 to 3 hours. This can vary.  If you have any questions, please call the office at the number listed above.   Sherri Rad, RN, BSN

## 2010-11-06 NOTE — Assessment & Plan Note (Signed)
Summary: Cardiology Nuclear Testing  Nuclear Med Background Indications for Stress Test: Evaluation for Ischemia, Graft Patency, Stent Patency   History: CABG, Echo, Heart Catheterization, Myocardial Infarction, Myocardial Perfusion Study, Stents  History Comments: '99 CABG; '04 [id number] apical infarct with P.I. ischemia in the infero-apical wall, EF=37%;  '08 Stent-SVG>RCA; '09 Echo:EF=45-50%, mild MR  Symptoms: DOE, Fatigue  Symptoms Comments: c/o profound weakness with hypotensive response and O2 Sat of 88% after starting new Rx   Nuclear Pre-Procedure Cardiac Risk Factors: History of Smoking, Hypertension, Lipids Caffeine/Decaff Intake: 6:00 pm  NPO After: 11:30 PM Lungs: Clear.  O2 Sat 97% on RA. IV 0.9% NS with Angio Cath: 20g     IV Site: R Antecubital IV Started by: Irean Hong, RN Chest Size (in) 46     Height (in): 68 Weight (lb): 196 BMI: 29.91  Nuclear Med Study 1 or 2 day study:  1 day     Stress Test Type:  Adenosine Reading MD:  Kristeen Miss, MD     Referring MD:  Charlies Constable, MD Resting Radionuclide:  Technetium 49m Tetrofosmin     Resting Radionuclide Dose:  10.9 mCi  Stress Radionuclide:  Technetium 37m Tetrofosmin     Stress Radionuclide Dose:  33.0 mCi   Stress Protocol  Dose of Adenosine:  49.9 mg    Stress Test Technologist:  Rea [employer], CMA-N     Nuclear Technologist:  Domenic Polite, CNMT  Rest Procedure  Myocardial perfusion imaging was performed at rest 45 minutes following the intravenous administration of Technetium 47m Tetrofosmin.  Stress Procedure  The patient received IV adenosine at 140 mcg/kg/min for 4 minutes. There were no significant changes with infusion. Technetium 64m Tetrofosmin was injected at the 2 minute mark and quantitative spect images were obtained after a 45 minute delay.  QPS Raw Data Images:  Normal; no motion artifact; normal heart/lung ratio. Stress Images:  There is a severe, large apical defect and a  moderate-severe inferior wall defect Rest Images:  There is a severe, large apical defect and a moderate-severe inferior wall defect Subtraction (SDS):  There is a large fixed apical and inferior scar. Transient Ischemic Dilatation:  1.03  (Normal <1.22)  Lung/Heart Ratio:  .40  (Normal <0.45)  Quantitative Gated Spect Images QGS EDV:  208 ml QGS ESV:  146 ml QGS EF:  30 % QGS cine images:  The LV is severely dilated.  The LV function is moderately-severly depressed.  There is apical akinesis and inferior hypokinesis.  Findings High risk nuclear study      Overall Impression  Exercise Capacity: Adenosine with no exercise. BP Response: The patient had hypotension at the end of the adenosine infusion. Clinical Symptoms: No chest pain ECG Impression: No significant ST segment change suggestive of ischemia. Overall Impression: No evidence of ischemia.  There is evidence of a previous apical MI and a possible inferior wall MI. Overall Impression Comments: Abnormal adenosine myoview.  There is no evidence of ischemia.  There is evidence of a previous apical MI and a probable inferior wall MI   Appended Document: Cardiology Nuclear Testing Dr. Juanda Chance called and spoke with the pt. He wants to set the pt up for a JV cath for next week. I will call the pt and his family tomorrow to schedule this.

## 2010-11-06 NOTE — Progress Notes (Signed)
Summary: Nuclear pre procedure  Phone Note Outgoing Call Call back at [facility] Phone [phone]   Call placed by: Rea College, CMA,  September 17, 2010 2:38 PM Call placed to: Patient Summary of Call: Reviewed information on Myoview Information Sheet (see scanned document for further details).  Spoke with patient.      Nuclear Med Background Indications for Stress Test: Evaluation for Ischemia, Graft Patency, Stent Patency   History: CABG, Echo, Heart Catheterization, Myocardial Infarction, Myocardial Perfusion Study, Stents  History Comments: '99 CABG; '04 [id number]:FAOZH apical infarct with P.I. ischemia in the infero-apical wall, EF=37%;  '08 Stent-SVG>RCA; '09 Echo:EF=45-50%, mild MR  Symptoms: Fatigue  Symptoms Comments: c/o profound weakness with hypotensive response and O2 Sat of 88% after starting new Rx   Nuclear Pre-Procedure Cardiac Risk Factors: History of Smoking, Hypertension, Lipids Height (in): 68

## 2010-11-06 NOTE — Assessment & Plan Note (Signed)
Summary: eval for defibrillator/sl   Visit Type:  eval for def Primary Provider:  Butler,MD  CC:  no complaints.  History of Present Illness: James [name] is 76 years old and is seen at the request of Dr. Juanda Chance for consideration of ICD/CRT implantation.  He has a history of coronary artery diseasea and is status post remote bypass surgery and in 2008 had stenting of the saphenous vein graft to the right coronary artery. His ejection fraction improved following revascularization but in August of 2010 he developed recurrent CHF and his ejection fraction was back down to 20%. At that time he had 3 of 4 grafts patent. he was recathed about a month ago after an episode of weakness hypotension and low oxygen saturation. Again he was found that three out of four grafts are patent his ejection fraction was 30% his wedge pressure was 9   the patient's lifestyle is very limited. He walks around his house goes out to get the paper twice a day. He is otherwise not active. He feel like he is limited primarily by his knees. He denies chest pain or shortness of breath    He lives with one of his daughters in [place]. Some other family members see Dr. Andee Lineman and they would like to see him in followup next year.  Problems Prior to Update: 1)  Nonspecific Abnormal Unspec Cv Function Study  (ICD-794.30) 2)  Bradycardia  (ICD-427.89) 3)  Hyperkalemia  (ICD-276.7) 4)  Cad, Autologous Bypass Graft  (ICD-414.02) 5)  Systolic Heart Failure, Chronic  (ICD-428.22) 6)  Obesity  (ICD-278.00) 7)  Cardiomyopathy, Ischemic  (ICD-414.8) 8)  Essential Hypertension, Benign  (ICD-401.1) 9)  Hyperlipidemia  (ICD-272.4)  Current Medications (verified): 1)  Furosemide 40 Mg Tabs (Furosemide) .... Take One Tablet By Mouth Daily. 2)  Adult Aspirin Low Strength 81 Mg Tbdp (Aspirin) .... Take One Tablet Once Daily 3)  Pravastatin Sodium 40 Mg Tabs (Pravastatin Sodium) .... Take One Tablet At Bedtime 4)  Plavix 75 Mg  Tabs (Clopidogrel Bisulfate) .... Take One Tablet Once Daily 5)  Levothyroxine Sodium 112 Mcg Tabs (Levothyroxine Sodium) .... Take One Tablet Daily 6)  Lisinopril 40 Mg Tabs (Lisinopril) .... Take 1/4  Tablet By Mouth Daily 7)  Gemfibrozil 600 Mg Tabs (Gemfibrozil) .... Take One Tablet Once Daily 8)  Omeprazole 20 Mg Cpdr (Omeprazole) .... Take One Tablet By Mouth Once Daily. 9)  Tylenol Extra Strength 500 Mg Tabs (Acetaminophen) .... As Needed (Arthritis) 10)  Nitrostat 0.4 Mg Subl (Nitroglycerin) .Marland Kitchen.. 1 Tablet Under Tongue At Onset of Chest Pain; You May Repeat Every 5 Minutes For Up To 3 Doses. 11)  Colchicine-Probenecid 0.5-500 Mg Tabs (Colchicine-Probenecid) .... Take One Half Tablet By Mouth Two Times A Day  Allergies (verified): 1)  ! Codeine 2)  ! Celebrex 3)  ! * Ivp Dye  Past History:  Past Medical History: Last updated: 06/20/2009  PAST MEDICAL HISTORY:    1. GERD 2. Coronary artery disease status post remote coronary bypass surgery     and status post placement of a drug-eluting stent to the saphenous     vein graft to the right coronary artery in December 2008 with 3/4 grafts patent at cath 06/2009. 3. Systolic heart failure Adx 06/2009 4. Ejection fraction of 20% by cath 06/2009 5. Hypertension. 6. Hyperlipidemia. 7. Gastroesophageal reflux disease. 8. Intolerance to beta-blockers due to bradycardia.   7. Hypothyroidism.   8. Obesity.   9. Right lung nodule.   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  Father died at age 46 of unknown causes.   He thinks his maternal grandfather died of a stroke in his 17s or 74s.  16 year old sister with hyperlipidemia.      Social History: Last updated: 03-26-09 Single -lives with sister Tobacco Use - Former.  Alcohol Use - no Regular Exercise - no Drug Use - no  Risk  Factors: Exercise: no (Mar 26, 2009)  Risk Factors: Smoking Status: quit (03-26-09)  Past Surgical History: bypass surgery  Review of Systems       full review of systems was negative apart from a history of present illness and past medical history.   Vital Signs:  Patient profile:   76 year old male Height:      68 inches Weight:      204.25 pounds BMI:     31.17 Pulse rate:   56 / minute BP sitting:   124 / 68  (left arm) Cuff size:   regular  Vitals Entered By: Caralee Ates CMA (October 28, 2010 2:45 PM)  Physical Exam  General:  Alert and oriented in no acute distress. HEENT  demonstrates a malformed right ear her heels has a skin tag on his chin  Neck veins were flat; carotids brisk and full without bruits. No lymphadenopathy. Back without kyphosis. Lungs clear. Heart sounds regular without murmurs or gallops. PMI nondisplaced. Abdomen soft with active bowel sounds without midline pulsation or hepatomegaly. Femoral pulses and distal pulses intact. Extremities were without clubbing cyanosis or edemaSkin warm and dry. Neurological exam grossly normal affect is unusual   Impression & Recommendations:  Problem # 1:  CARDIOMYOPATHY, ISCHEMIC (ICD-414.8) the patient has ischemic myopathy with an ejection fraction of 30% despite optimal medical therapy. He is bradycardic and this limits the use of beta blockers. He is an appropriate candidate for consideration of ICD implantation. we discussed the potential benefits as well as potential risks including but not limited to infection lead dislodgment inappropriate shocks. We also discussed the mitigating effects of age although I don't think that that is preclusive. In addition we discussed the presence of his left bundle branch block and the fact that with his limitations I would pursue CRT implantation which might help prevent the development of recurrent congestive heart. JHe would like to consider this. We will get back to him in  next couple of weeks. His updated medication list for this problem includes:    Furosemide 40 Mg Tabs (Furosemide) .Marland Kitchen... Take one tablet by mouth daily.    Adult Aspirin Low Strength 81 Mg Tbdp (Aspirin) .Marland Kitchen... Take one tablet once daily    Plavix 75 Mg Tabs (Clopidogrel bisulfate) .Marland Kitchen... Take one tablet once daily    Lisinopril 40 Mg Tabs (Lisinopril) .Marland Kitchen... Take 1/4  tablet by mouth daily    Nitrostat 0.4 Mg Subl (Nitroglycerin) .Marland Kitchen... 1 tablet under tongue at onset of chest pain; you may repeat every 5 minutes for up to 3 doses.  Problem # 2:  BRADYCARDIA (ICD-427.89) he had that we will pursue device implantation dual-chamber device would be appropriate given his bradycardia which allows the free of use beta blockers    Adult Aspirin Low Strength 81 Mg Tbdp (Aspirin) .Marland Kitchen... Take one tablet once daily    Plavix 75 Mg Tabs (Clopidogrel bisulfate) .Marland Kitchen... Take one tablet once daily    Lisinopril 40 Mg Tabs (Lisinopril) .Marland KitchenMarland KitchenMarland KitchenMarland Kitchen  Take 1/4  tablet by mouth daily    Nitrostat 0.4 Mg Subl (Nitroglycerin) .Marland Kitchen... 1 tablet under tongue at onset of chest pain; you may repeat every 5 minutes for up to 3 doses.  Problem # 3:  CAD, AUTOLOGOUS BYPASS GRAFT (ICD-414.02) as above His updated medication list for this problem includes:    Adult Aspirin Low Strength 81 Mg Tbdp (Aspirin) .Marland Kitchen... Take one tablet once daily    Plavix 75 Mg Tabs (Clopidogrel bisulfate) .Marland Kitchen... Take one tablet once daily    Lisinopril 40 Mg Tabs (Lisinopril) .Marland Kitchen... Take 1/4  tablet by mouth daily    Nitrostat 0.4 Mg Subl (Nitroglycerin) .Marland Kitchen... 1 tablet under tongue at onset of chest pain; you may repeat every 5 minutes for up to 3 doses.  Problem # 4:  LBBB (ICD-426.3) see above His updated medication list for this problem includes:    Adult Aspirin Low Strength 81 Mg Tbdp (Aspirin) .Marland Kitchen... Take one tablet once daily    Plavix 75 Mg Tabs (Clopidogrel bisulfate) .Marland Kitchen... Take one tablet once daily    Lisinopril 40 Mg Tabs (Lisinopril) .Marland Kitchen... Take 1/4   tablet by mouth daily    Nitrostat 0.4 Mg Subl (Nitroglycerin) .Marland Kitchen... 1 tablet under tongue at onset of chest pain; you may repeat every 5 minutes for up to 3 doses.  Patient Instructions: 1)  Your physician recommends that you continue on your current medications as directed. Please refer to the Current Medication list given to you today.

## 2010-11-07 ENCOUNTER — Encounter: Payer: Self-pay | Admitting: *Deleted

## 2010-12-15 LAB — POCT I-STAT 3, VENOUS BLOOD GAS (G3P V)
Acid-base deficit: 4 mmol/L — ABNORMAL HIGH (ref 0.0–2.0)
pCO2, Ven: 42.5 mmHg — ABNORMAL LOW (ref 45.0–50.0)
pO2, Ven: 34 mmHg (ref 30.0–45.0)

## 2010-12-15 LAB — POCT I-STAT 3, ART BLOOD GAS (G3+)
Bicarbonate: 20.3 mEq/L (ref 20.0–24.0)
TCO2: 21 mmol/L (ref 0–100)
pCO2 arterial: 35.6 mmHg (ref 35.0–45.0)
pH, Arterial: 7.364 (ref 7.350–7.450)

## 2010-12-23 ENCOUNTER — Ambulatory Visit (INDEPENDENT_AMBULATORY_CARE_PROVIDER_SITE_OTHER): Payer: MEDICARE | Admitting: Cardiology

## 2010-12-23 ENCOUNTER — Encounter: Payer: Self-pay | Admitting: Cardiology

## 2010-12-23 VITALS — BP 142/80 | HR 51 | Ht 69.0 in | Wt 203.0 lb

## 2010-12-23 DIAGNOSIS — I2589 Other forms of chronic ischemic heart disease: Secondary | ICD-10-CM

## 2010-12-23 DIAGNOSIS — E875 Hyperkalemia: Secondary | ICD-10-CM

## 2010-12-23 DIAGNOSIS — I25810 Atherosclerosis of coronary artery bypass graft(s) without angina pectoris: Secondary | ICD-10-CM

## 2010-12-23 DIAGNOSIS — I498 Other specified cardiac arrhythmias: Secondary | ICD-10-CM

## 2010-12-23 DIAGNOSIS — I5022 Chronic systolic (congestive) heart failure: Secondary | ICD-10-CM

## 2010-12-23 NOTE — Assessment & Plan Note (Signed)
Will not challenged the patient with  Aldactone given his prior History of hyperkalemia.

## 2010-12-23 NOTE — Assessment & Plan Note (Signed)
Patient declines CRT-D as outlined above

## 2010-12-23 NOTE — Assessment & Plan Note (Signed)
Intolerance to beta-blocker secondary to bradycardia which cannot be added to his heart failure regimen.

## 2010-12-23 NOTE — Assessment & Plan Note (Signed)
Patient has an ischemic cardiomyopathy.  His ejection fraction is 30%.  He hemodynamically well compensated.  The patient declines CRT-D at this point in time.  He has discussed this with Dr. Graciela Husbands.

## 2010-12-23 NOTE — Progress Notes (Signed)
HPI The patient is a 76 year old male with a history of coronary artery disease and heart failure.  He had a catheterization several months ago.  He has remote bypass surgery in 2008 with stenting of the saphenous vein graft to the right coronary artery.  In August of 2010 he developed recurrent heart failure and his ejection fraction was down to 20%.  At that time he had 3 out of 4 grafts patent. The patient was brought in for catheterization in December of 2011 and again 3 out of 4 grafts are patent and his ejection fraction was 30%.  Pulmonary capillary wedge pressure was 9.  There have been some discrepancies in his ejection fraction was comparing echo, nuclear and cardiac catheterization.  It appears that his ejection fraction is anywhere around 30 to 40%. The patient lives in [place] and has decided to follow-up in our office. Dr. Dickie La also referred the patient for ICD he has a wide QRS hundred and 46 ms and an ejection fraction of 30%.  EP consultation was arranged the patient has a very limited lifestyle.  It is not perfectly very active.  He feels like is limited primarily by his knees. He denied previously any chest pain or shortness of breath. After seeing Dr. Graciela Husbands the decision was made possibly pursue CRT-D implantation.  The patient is also limited by bradycardia in regards to the use of beta-blockers. The patient has no decided that he will not proceed with CRT-D.  He feels that his lifestyle is to limit it.  He really doesn't get around very much.  He pretty much lives in his house and doesn't come out very much. Is also a good heart failure regimen that he cannot tolerate a beta-blocker secondary to bradycardia.  Allergies  Allergen Reactions  . Celecoxib   . Codeine   . Iohexol     Current Outpatient Prescriptions on File Prior to Visit  Medication Sig Dispense Refill  . aspirin 81 MG tablet Take one by mouth daily       . clopidogrel (PLAVIX) 75 MG tablet Take 75 mg by  mouth daily.        . colchicine-probenecid 0.5-500 MG per tablet Take 1/2 by mouth twice daily       . furosemide (LASIX) 40 MG tablet Take 40 mg by mouth daily.        Marland Kitchen gemfibrozil (LOPID) 600 MG tablet Take one by mouth daily       . levothyroxine (SYNTHROID, LEVOTHROID) 112 MCG tablet Take 112 mcg by mouth daily.        Marland Kitchen lisinopril (PRINIVIL,ZESTRIL) 40 MG tablet Take 1/2 by mouth daily      . nitroGLYCERIN (NITROSTAT) 0.4 MG SL tablet Place 0.4 mg under the tongue every 5 (five) minutes as needed.        Marland Kitchen omeprazole (PRILOSEC) 20 MG capsule Take 20 mg by mouth daily.        . pravastatin (PRAVACHOL) 40 MG tablet Take 40 mg by mouth daily.        Marland Kitchen DISCONTD: acetaminophen (TYLENOL) 500 MG tablet Take 500 mg by mouth every 6 (six) hours as needed.          Past Medical History  Diagnosis Date  . Coronary artery disease 09/2007    s/p CABG and s/p placement of drug-eluting stent tothe saphenous vein graft to right coronary arter   . CHF (congestive heart failure)     which is felt to be compensated/Adx 06/2009  .  GERD (gastroesophageal reflux disease)   . Hypertension   . Hyperlipidemia   . Thyroid disease   . Obesity   . Nodule of right lung   . Porcelain gallbladder     Past Surgical History  Procedure Date  . Cardiac catheterization 06/2009  . Coronary artery bypass graft 09/2007    Family History  Problem Relation Age of Onset  . Hyperlipidemia Sister     History   Social History  . Marital Status: Single    Spouse Name: N/A    Number of Children: N/A  . Years of Education: N/A   Occupational History  . Not on file.   Social History Main Topics  . Smoking status: Former Smoker    Quit date: 06/05/1998  . Smokeless tobacco: Not on file  . Alcohol Use: No  . Drug Use: No  . Sexually Active: Not on file     lives with sister/no exercise   Other Topics Concern  . Not on file   Social History Narrative  . No narrative on file   The patient denies  fatigue, malaise, fever, weight gain/loss, vision loss, decreased hearing, hoarseness, chest pain, palpitations, shortness of breath, prolonged cough, wheezing, sleep apnea, coughing up blood, abdominal pain, blood in stool, nausea, vomiting, diarrhea, heartburn, incontinence, blood in urine, muscle weakness, joint pain, leg swelling, rash, skin lesions, headache, fainting, dizziness, depression, anxiety, enlarged lymph nodes, easy bruising or bleeding, and environmental allergies.    PHYSICAL EXAM BP 142/80  Pulse 51  Ht 5\' 9"  (1.753 m)  Wt 203 lb (92.08 kg)  BMI 29.98 kg/m2 General: Well-developed, well-nourished in no distress, pale-appearing Head: Normocephalic and atraumatic Eyes:PERRLA/EOMI intact, conjunctiva and lids normal Ears: No deformity or lesions Mouth:normal dentition, normal posterior pharynx Neck: Supple, no JVD.  No masses, thyromegaly or abnormal cervical nodes Lungs: Normal breath sounds bilaterally without wheezing.  Normal percussion Cardiac: regular rate and rhythm with normal S1 and S2, no S3 or S4.  PMI is normal.  No pathological murmurs Abdomen: Normal bowel sounds, abdomen is soft and nontender without masses, organomegaly or hernias noted.  No hepatosplenomegaly MSK: Back normal, normal gait muscle strength and tone normal, significant joint deformities in both knees. Vascular: Pulse is normal in all 4 extremities Extremities: No peripheral pitting edema Neurologic: Alert and oriented x 3 Skin: Intact without lesions or rashes Lymphatics: No significant adenopathyPsychologic: Normal affect   ECG:  ASSESSMENT AND PLAN

## 2010-12-23 NOTE — Patient Instructions (Signed)
The current medical regimen is effective;  continue present plan and medications.  Follow up in 6 months

## 2010-12-23 NOTE — Assessment & Plan Note (Signed)
coronary artery disease status post bypass grafting 3 out of 4 grafts patent by catheterization in December 2011 ejection fraction 30%

## 2011-01-09 LAB — CBC
HCT: 42.2 % (ref 39.0–52.0)
MCHC: 33.8 g/dL (ref 30.0–36.0)
MCV: 93.2 fL (ref 78.0–100.0)
RBC: 4.52 MIL/uL (ref 4.22–5.81)

## 2011-01-09 LAB — BASIC METABOLIC PANEL
CO2: 30 mEq/L (ref 19–32)
Chloride: 99 mEq/L (ref 96–112)
Creatinine, Ser: 1.53 mg/dL — ABNORMAL HIGH (ref 0.4–1.5)
GFR calc Af Amer: 54 mL/min — ABNORMAL LOW (ref >60)
Potassium: 3.7 mEq/L (ref 3.5–5.1)

## 2011-01-10 LAB — CBC
HCT: 38.9 % — ABNORMAL LOW (ref 39.0–52.0)
MCHC: 33.8 g/dL (ref 30.0–36.0)
MCHC: 34 g/dL (ref 30.0–36.0)
MCV: 92.1 fL (ref 78.0–100.0)
MCV: 92.9 fL (ref 78.0–100.0)
Platelets: 172 10*3/uL (ref 150–400)
RBC: 4.23 MIL/uL (ref 4.22–5.81)
RDW: 13.8 % (ref 11.5–15.5)
RDW: 14.3 % (ref 11.5–15.5)
WBC: 10 10*3/uL (ref 4.0–10.5)
WBC: 9.5 10*3/uL (ref 4.0–10.5)

## 2011-01-10 LAB — POCT CARDIAC MARKERS
CKMB, poc: 1.8 ng/mL (ref 1.0–8.0)
Myoglobin, poc: 111 ng/mL (ref 12–200)
Myoglobin, poc: 119 ng/mL (ref 12–200)
Troponin i, poc: <0.05 ng/mL (ref 0.00–0.09)

## 2011-01-10 LAB — URINALYSIS, ROUTINE W REFLEX MICROSCOPIC
Glucose, UA: NEGATIVE mg/dL
Ketones, ur: NEGATIVE mg/dL
Protein, ur: NEGATIVE mg/dL
Urobilinogen, UA: 0.2 mg/dL (ref 0.0–1.0)

## 2011-01-10 LAB — BASIC METABOLIC PANEL
BUN: 37 mg/dL — ABNORMAL HIGH (ref 6–23)
BUN: 25 mg/dL — ABNORMAL HIGH (ref 6–23)
CO2: 30 mEq/L (ref 19–32)
CO2: 34 mEq/L — ABNORMAL HIGH (ref 19–32)
Calcium: 9.1 mg/dL (ref 8.4–10.5)
Chloride: 101 mEq/L (ref 96–112)
Chloride: 93 mEq/L — ABNORMAL LOW (ref 96–112)
Chloride: 94 mEq/L — ABNORMAL LOW (ref 96–112)
Creatinine, Ser: 1.83 mg/dL — ABNORMAL HIGH (ref 0.4–1.5)
GFR calc Af Amer: 44 mL/min — ABNORMAL LOW (ref >60)
GFR calc Af Amer: >60 mL/min (ref >60)
GFR calc non Af Amer: 49 mL/min — ABNORMAL LOW (ref >60)
GFR calc non Af Amer: >60 mL/min (ref >60)
Glucose, Bld: 139 mg/dL — ABNORMAL HIGH (ref 70–99)
Glucose, Bld: 96 mg/dL (ref 70–99)
Glucose, Bld: 113 mg/dL — ABNORMAL HIGH (ref 70–99)
Potassium: 3.2 mEq/L — ABNORMAL LOW (ref 3.5–5.1)
Potassium: 3.9 mEq/L (ref 3.5–5.1)
Potassium: 3.9 mEq/L (ref 3.5–5.1)
Potassium: 4 mEq/L (ref 3.5–5.1)
Sodium: 139 mEq/L (ref 135–145)

## 2011-01-10 LAB — COMPREHENSIVE METABOLIC PANEL
ALT: 15 U/L (ref 0–53)
AST: 20 U/L (ref 0–37)
Albumin: 4.2 g/dL (ref 3.5–5.2)
Calcium: 9.4 mg/dL (ref 8.4–10.5)
GFR calc Af Amer: 58 mL/min — ABNORMAL LOW (ref >60)
Potassium: 4.5 mEq/L (ref 3.5–5.1)
Sodium: 138 mEq/L (ref 135–145)
Total Protein: 6.8 g/dL (ref 6.0–8.3)

## 2011-01-10 LAB — LIPID PANEL
Cholesterol: 126 mg/dL (ref 0–200)
LDL Cholesterol: 70 mg/dL (ref 0–99)

## 2011-01-10 LAB — CARDIAC PANEL(CRET KIN+CKTOT+MB+TROPI)
CK, MB: 4.9 ng/mL — ABNORMAL HIGH (ref 0.3–4.0)
CK, MB: 5.3 ng/mL — ABNORMAL HIGH (ref 0.3–4.0)
Total CK: 78 U/L (ref 7–232)
Total CK: 95 U/L (ref 7–232)
Total CK: 96 U/L (ref 7–232)
Troponin I: 0.59 ng/mL (ref 0.00–0.06)

## 2011-01-10 LAB — DIFFERENTIAL
Eosinophils Absolute: 0.2 10*3/uL (ref 0.0–0.7)
Eosinophils Relative: 1 % (ref 0–5)
Lymphs Abs: 1.9 10*3/uL (ref 0.7–4.0)
Monocytes Absolute: 0.9 10*3/uL (ref 0.1–1.0)
Monocytes Relative: 6 % (ref 3–12)

## 2011-01-10 LAB — TSH: TSH: 4.027 u[IU]/mL (ref 0.350–4.500)

## 2011-01-10 LAB — POCT I-STAT 3, VENOUS BLOOD GAS (G3P V)
Acid-base deficit: 2 mmol/L (ref 0.0–2.0)
O2 Saturation: 54 %
pCO2, Ven: 42.7 mmHg — ABNORMAL LOW (ref 45.0–50.0)

## 2011-01-10 LAB — POCT I-STAT 3, ART BLOOD GAS (G3+): pH, Arterial: 7.371 (ref 7.350–7.450)

## 2011-01-10 LAB — APTT: aPTT: 33 seconds (ref 24–37)

## 2011-02-17 NOTE — Assessment & Plan Note (Signed)
[facility]                            CARDIOLOGY OFFICE NOTE   ERVIE, MCCARD                     MRN:          [id number]  DATE:10/24/2007                            DOB:          [birth year]    PRIMARY CARE PHYSICIAN:  Dr. Samuel Jester.   PAST MEDICAL HISTORY:  Mr. Palecek is 76 years old and returns for  management of his coronary heart disease and congestive heart failure.  In December he developed unstable angina, underwent catheterization in  the outpatient laboratory and was found to have a high-grade lesion in  the vein graft to the right coronary artery with severe depression of  his LV function with an ejection fraction of 15-20%.  His pulmonary  wedge pressure, he was in congestive heart failure was diuresed, and by  the time we put a Swan-Ganz catheter in, in the upstairs laboratory,  pulmonary wedge pressure was 16.  He has done quite well since that  time.  His shortness of breath is greatly improved and he had no  recurrent chest pain.  He was seen by Herma Carson on December 17 was  felt to be compensated.   PAST MEDICAL HISTORY:  Is significant for hyperlipidemia, hypertension,  GERD, and right lung nodule.  He had a follow-up CT for that today.  He  also has had bradycardia and has been unable to a beta blockers.   CURRENT MEDICATIONS:  The levothyroxine, furosemide, aspirin, statin,  Plavix, gemfibrozil, Prilosec, lisinopril and K-Dur.   EXAMINATION:  Blood pressures 125/66 and pulse 65 and regular.  There is no venous distension.  Pulses were full without bruits.  CHEST:  Clear.  HEART:  Rhythm is regular.  No murmurs or gallops.  ABDOMEN:  Soft with normal bowel sounds.  Good pulses.  No peripheral edema.   IMPRESSION:  1. Coronary artery disease status post prior coronary bypass graft      surgery and status post recent placement of two drug-eluting stents      and the saphenous vein graft to the right coronary  following an out-      of-hospital myocardial infarction.  2. Systolic congestive heart failure now improved and appears      euvolemic, class II to III.  3. Ejection fraction 15-20%.  4. Hypertension.  5. Hyperlipidemia.  6. Gastroesophageal reflux disease.  7. Intolerance to beta blockers to bradycardia.  8. Right lung nodule.   RECOMMENDATIONS:  Mr. Mells has made an amazing improvement and looks  quite well.  Will plan to get a BMP and CBC on him today.  He will get a  follow-up 2-D echo to see if his LV function remains depressed.  If so,  then I think we should consider him for an ICD and BIV  pacer.  His QRS duration is 132 milliseconds.  Will plan on 3 months  follow-up and will monitor that depending on results with an echo and a  decision regarding that.     Bruce Elvera Lennox Juanda Chance, MD, [facility]  Electronically Signed    BRB/MedQ  DD: 10/24/2007  DT: 10/24/2007  Job #: [id number]

## 2011-02-17 NOTE — Cardiovascular Report (Signed)
NAME:  ESKER, DEVER              ACCOUNT NO.:  [id number]   MEDICAL RECORD NO.:  [id number]          PATIENT TYPE:  OIB   LOCATION:  1967                         FACILITY:  [facility]   PHYSICIAN:  Everardo Beals. Juanda Chance, MD, FACCDATE OF BIRTH:  [birth year]   DATE OF PROCEDURE:  09/09/2007  DATE OF DISCHARGE:                            CARDIAC CATHETERIZATION   CLINICAL HISTORY:  Mr. Uzzle is 76 years old and has had previous  bypass surgery and previous percutaneous coronary interventions.  He  lives with his sister.  He was last studied in 2005 at which time his  grafts were patent.  Over the last couple of weeks he has developed  symptoms of indigestion, belching and burping.  He had one bad episode 2  weeks ago where he called EMS and they came to his house but did not  bring him to the hospital.  We saw him yesterday in the clinic with  persistent symptoms.  His ECG suggested a possible new inferolateral  infarction and we brought him in today for a catheterization the [facility] lab.   PROCEDURE:  The procedure was performed by the right femoral artery  using an arterial sheath and 4-French preformed coronary catheters.  A  front wall arterial puncture was performed and Omnipaque contrast was  used.  We used a left bypass graft catheter for injection of the vein  graft to the circumflex artery and an AL-1 for injection of the vein  graft to diagonal branch of the LAD, and a multipurpose catheter for  injection of the vein graft to the right coronary.  The patient was  belching and burping throughout much of the procedure and his blood  pressure was about 90 systolic during the procedure.   RESULTS:  The aortic pressure was 92/49.  The left ventricular pressure  was 92/37.   The left main coronary artery was free of significant disease.   The left anterior descending artery gave rise to septal perforators and  a diagonal branch.  The LAD was completely occluded at the diagonal  branch.   There was a 90% stenosis in the ostium of the diagonal branch.   The circumflex artery gave rise to a ramus branch, an atrial branch, a  marginal branch and then was completely occluded in its mid to distal  portion.  There was a 90% stenosis before the complete occlusion.   The right coronary artery was a moderate-sized vessel, gave rise to a  conus branch and two right ventricular branches, and then was completely  occluded in its mid portion.   The saphenous vein graft to the posterior descending branch of the right  coronary artery was patent but there was an 80% mid to distal stenosis  and a 95% distal stenosis with TIMI 2 flow distally.  Distal vessel  consisted of a posterior descending and a large and small posterolateral  branch.  There was also a filling defect in the mid portion of the vein  graft which probably represent thrombus.   The saphenous vein graft to the posterolateral branch of circumflex  artery was patent.  There was 80% systolic compression in the proximal  portion of this vein graft.   The vein graft to the diagonal branch LAD was completely occluded at its  origin.   The LIMA graft to the LAD was patent.  The distal LAD had a 50%  narrowing in its distal portion.   The left ventriculogram performed in the RAO projection showed  calcification of the apical wall and akinesis of the apical wall.  The  anterolateral wall moved fairly well.  The inferior wall was hypokinetic  and this was worse than on the previous study.  The estimated ejection  fraction was 15-20%.   CONCLUSION:  1. Coronary artery status post remote coronary bypass graft surgery.  2. Severe native vessel disease with total occlusion of the left      anterior descending artery, total occlusion of the right coronary      artery, and total occlusion of the mid to distal circumflex artery.  3. Patent vein graft to the posterolateral branch of the circumflex      artery with 80% systolic  compression in the proximal portion,      patent left internal mammary artery graft to the left anterior      descending artery with 50% narrowing in the distal left anterior      descending artery, occluded vein graft to diagonal branch of the      left anterior descending artery (new since last study but probably      not recent), and 80% mid to distal and 95% distal stenosis in the      vein graft to the posterior descending branch of the right coronary      artery.  4. Apical wall akinesis and inferior wall hypokinesis with an      estimated ejection fraction of 15-20%.  There is also 2+ mitral      regurgitation.   RECOMMENDATIONS:  The patient is seriously and critically ill.  He has  occlusion of the vein graft to the diagonal branch of the LAD and new  severe critical disease in the vein graft to the right coronary with  TIMI 2 flow.  He is having symptoms of belching and burping which may  represent an ischemic equivalent.  Will plan to take him upstairs  urgently for intervention of the vein graft to the right coronary  artery.  Given the severity of the LV dysfunction and his decompensated  state with hypotension and markedly elevated filling pressures, his  outlook remains guarded.  Will plan to put a Swan-Ganz catheter when he  gets upstairs to the laboratory.      Bruce Elvera Lennox Juanda Chance, MD, [facility]  Electronically Signed     BRB/MEDQ  D:  09/09/2007  T:  09/09/2007  Job:  [id number]   cc:   Cardiopulmonary Lab  Samuel Jester

## 2011-02-17 NOTE — Assessment & Plan Note (Signed)
[facility]                            CARDIOLOGY OFFICE NOTE   James [name], James [name]                       MRN:          [id number]  DATE:09/08/2007                            DOB:          1935-07-09    PRIMARY CARDIOLOGIST:  Everardo Beals. James Chance, MD, F.A.C.C.   PRIMARY CARE Ora Bollig:  Dr. Samuel Jester   PATIENT PROFILE:  [age]-year-old Caucasian male, prior history of CAD  and ischemic cardiomyopathy, who presents with a two-week history of  reflux type symptoms and belching.   PROBLEM LIST:  1. Coronary artery disease.      a.     Status post CABG times four in 1999, with a LIMA to the LAD,       vein graft to the diagonal, vein graft to the PDA, vein graft to       the posterolateral branch of the circumflex.      b.     June 26, 2004, cardiac catheterization showing severe       native vessel disease with occlusion of the circumflex, right       coronary artery and LAD, along with patent grafts.  Ejection       fraction was 25%.  2. Ischemic cardiomyopathy.      a.     January 20, 2007, 2D echocardiogram showing significant       dilatation of the left ventricle with relatively good movement at       the base and mid-ventricle; however, potentially no movement at       the apex.  Poor visualization.  Possible overall LV function not       significantly reduced.  Mild MR.  3. Presyncope in March of 2008.      a.     CardioNet monitoring in April of 2008, showing occasional       asymptomatic drops in heart rate in the 40s during periods of       sleep.  Beta blocker dose was discontinued.  4. Hypertension.  5. Hyperlipidemia.  6. Sinus bradycardia.  7. GERD.  8. Treated hypothyroidism.  9. Obesity.  10.Abnormal chest CT with 3 mm right-lung nodule.  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  12.Chronic systolic congestive heart failure, which is well-      compensated.   HISTORY OF PRESENT ILLNESS:   [age]-year-old Caucasian male with the  above problem list.  He was last seen in clinic in July of 2008.  Mr.  [name] reports that, approximately two weeks ago, about an hour after  dinner, he noted epigastric burning with frequent belching, along with  increased work of breathing, mild diaphoresis.  He denies any chest  pain.  His prior anginal equivalent is described as bilateral scapular  discomfort and shoulder pain and this was not present either.  His  sister felt as though he looked poorly and she called EMS.  They  apparently came out to the house and it was decided that he was doing  somewhat better, or did not require hospitalization, and they left.  He  took some Rolaids, Pepto Bismol and a teaspoon of vinegar and, after  approximately seven hours of these symptoms, he felt somewhat better.  He did note, however, that any time he would lie flat on his bed, he  would have return of this epigastric burning and belching.  He saw his  primary care [name] on November 25 and had some blood work drawn.  Of  note, his myoglobin was mildly elevated at 105 with the upper limit of  normal range being 72.  His potassium was also elevated at 5.5.  He was  told to follow up with cardiology.  Since that episode, approximately  two weeks ago, he says every time he lies back flat, he has returns of  epigastric burning and belching without any other associated symptoms.  He also says that he has been feeling fatigued throughout the day, but  denies any chest pain or more shortness of breath than usual.   He lives a fairly sedentary lifestyle and does not do a lot during the  day.  Because of the epigastric symptoms and belching that occur when he  lies flat, he has been sleeping in a recliner at night.  He says he has  not been sleeping well in the recliner.  He has also noted some lower  extremity edema, although his weight is down about 6-7 pounds.  He  denies PND or orthopnea.  (He has  no problem breathing when he is lying  flat, just reflux symptoms.)  he further denies any dizziness, syncope.  He has had a very poor appetite over the past two weeks.   ALLERGIES:  IV DYE.   HOME MEDICATIONS:  1. Levothyroxine 137 micrograms daily.  2. Aspirin 81 mg daily.  3. Lisinopril 20 mg a half tablet daily.  4. Omeprazole 20 mg daily.  5. K-Dur 10 mEq a half tablet daily.  6. Gemfibrozil 600 mg daily.  7. Pravastatin 20 mg daily.  8. Lasix 20 mg daily.  9. Nitroglycerin p.r.n.   FAMILY HISTORY:  His mother died, possibly of a stroke, at age 56.  Father died at age 46 of unknown causes.  He thinks his maternal  grandfather died of a stroke in his 70s or 75s.  He has a 74 year old  sister with hyperlipidemia.   SOCIAL HISTORY:  He lives in [place] with his sister.  He denies any  tobacco, alcohol or drug use.  He does not work.  He does not routinely  exercise.  He drinks two to three cups of coffee and two to three diet  sodas a day.   REVIEW OF SYSTEMS:  He has had some constipation and has recently been  started on p.r.n. stool softener by Dr. Charm Barges.  He has had epigastric  burning with belching as outlined in the HPI.  Also, he has chronic  shortness of breath and inactivity.   PHYSICAL EXAM:  Blood pressure 117/70, heart rate 86, respirations 16,  afebrile, weight is 220 pounds, down 6 pounds since the last visit.  Pleasant white male, in no acute distress, awake, alert and oriented  times three.  NECK:  No bruits or JVD.  LUNGS:  Respirations regular, unlabored.  He has fine crackles in the  bases.  CARDIAC:  Regular, S1, S2, no S3, S4 or murmurs.  ABDOMEN:  Obese, soft, nontender, nondistended.  Bowel sounds present  times four.  EXTREMITIES:  Warm, dry, pink, with trace right lower extremity edema.  Dorsalis pedis and  posterior tibial pulses 2+ and equal bilaterally.  No  femoral bruits noted.   ACCESSORY CLINICAL FINDINGS:  His EKG shows sinus rhythm  with IVCD.  Compared to his last EKG in July of this year, he does have changes with  apparent Q-waves in inferior leads, along with V6.  CBC, BMET, PT, PTT  are pending.  Chest x-ray pending.   ASSESSMENT AND PLAN:  1. Epigastric discomfort.  Patient has a history of CAD, with prior      anginal equivalent being scapula and shoulder discomfort.  He has      not had any recurrence of that, but has had this what almost sounds      like reflux type symptoms, occurring almost exclusively when he is      lying flat, associated with belching.  He had an episode, two      Sundays ago, where he had these symptoms in combination with      significant shortness of breath and some mild diaphoresis,      persisting for about seven hours.  He does have some ECG changes      that were not present on the last ECG.  I have reviewed his case      with Dr. Brodie and, given his history with ECG changes and mild      elevation in myoglobin, we will plan on left heart catheterization      with grafts tomorrow morning in the outpatient lab with Dr. Brodie.      We will obtain blood work today.  As he does have a dye allergy, we      will provide him prescriptions for prednisone 30 mg b.i.d., along      with Cimetidine 300 mg q. 6 hours.  He will also receive Benadryl      IV prior to his procedure.  He will otherwise remain on his home      medications, which include aspirin, ACE inhibitor, and statin      therapy.  Of note:  Beta blocker was discontinued in the past,      secondary to bradycardia.  2. Coronary artery disease.  See #1.  3. Ischemic cardiomyopathy.  Last documented EF about 25%, and this      may have improved by echo of April 2008.  He does have some mild      lower extremity edema, more notable on the right, which is where he      had his vein graft taken from.  I suspect some of his edema is      dependent in nature, as he is sedentary throughout the day, or      sitting, watching TV,  and then he has been sleeping in a recliner      at night, without getting his legs adequately elevated.  We will      make no change to his Lasix.  4. Hyperkalemia.  Patient had blood work with Dr. Butler on November      25 , and his potassium was elevated at 5.5.  He does take 10 mEq of      potassium a day, as well as an ACE inhibitor.  We are rechecking      blood work today and I have told him not to take his potassium      until further notice.  5. Hypertension, stable.  6. Hyperlipidemia.  He is on statin and fibrate therapy.  7. History of bradycardia  and presyncope.  His beta blocker was      discontinued.   DISPOSITION:  Patient will undergo catheterization tomorrow and follow  up in approximately two weeks post-cath.      Nicolasa Ducking, ANP  Electronically Signed      Everardo Beals. James Chance, MD, [facility]  Electronically Signed   CB/MedQ  DD: 09/08/2007  DT: 09/08/2007  Job #: [id number]   cc:   Samuel Jester

## 2011-02-17 NOTE — Assessment & Plan Note (Signed)
[facility]                            CARDIOLOGY OFFICE NOTE   NAME:JOHNSONJas, Betten                     MRN:          [id number]  DATE:01/12/2008                            DOB:          02-Sep-1935    PRIMARY CARE PHYSICIAN:  Dr. Samuel Jester.   CLINICAL HISTORY:  Mr. Hunsucker is 76 years old and returns for  management of his coronary heart disease and congestive heart failure.  He has had a previous bypass surgery and was hospitalized in December  with unstable angina.  He was initially studied in the outpatient  laboratory, but developed symptoms between his office visit and the  outpatient procedure and was found to have a high-grade lesion in the  vein graft to the right coronary with severe reduction of his LV  function with an ejection fraction of 15-20%, and congestive heart  failure.  We brought him upstairs and did an urgent dilatation of his  graft and diuresed and, and he improved dramatically.  We did a follow-  up echocardiogram in January, that showed his ejection fraction had  improved from 15-20% to 40-45%.  He says he has done quite well since  that time.  He has had no recent chest pain, shortness breath or  palpitations.   PAST MEDICAL HISTORY:  Significant for hypertension, hyperlipidemia and  GERD.  He had a previous right lung nodule that we evaluated with CT.  I  will need to check on follow-up on that.  He also has hyperthyroidism  and on Synthroid and recently had a TSH, which was low at I believe  0.16, and I think they inadvertently increased his Synthroid rather than  decrease it at the [facility].   CURRENT MEDICATIONS:  1. Levothyroxine 0.137 mg daily.  2. Furosemide 20 mg daily.  3. Aspirin.  4. Pravastatin 40 mg 1/2 tablet nightly.  5. Plavix.  6. Gemfibrozil 600 mg daily.  7. Prilosec 20 mg daily.  8. Lisinopril 20 mg 1-1/2 tablets daily.  9. K-Dur 20 mEq 1/2 tablet daily.   PHYSICAL EXAMINATION:  VITAL  SIGNS:  Blood pressure is 114/72, pulse 56  and regular.  NECK:  There was no venous distention.  The carotid pulses were full  without bruits.  CHEST:  Clear.  HEART:  Heart rhythm is regular.  No murmurs or gallops.  ABDOMEN:  The abdomen was soft.  No organomegaly.  EXTREMITIES:  Peripheral pulses were full and there is no peripheral  edema.   Electrocardiogram showed sinus bradycardia with an intraventricular  conduction delay and an old diaphragmatic and lateral wall infarction.   IMPRESSION:  1. Coronary artery disease, status post prior coronary bypass graft.      Status post recent placement of drug-eluting stents in the      saphenous vein graft to the right coronary artery following an out      of hospital myocardial infarction.  2. Systolic heart failure, now improved and euvolemic, class II.  3. Ejection fraction initially 15-20% during hospitalization in      December, now 40-45%.  4. Hypertension.  5. Hyperlipidemia.  6. Gastroesophageal reflux disease.  7. Intolerance to beta-blockers due to bradycardia.  8. Right lung nodule.   PLAN:  I think Mr. Thrall is doing quite well.  He recently had  laboratory work done at the [facility] and I asked him to get that for  [place].  I also wrote a note about adjustment of his thyroid medications.  His TSH was low, so this will need to be decreased.  We will see Mr.  Heitzenrater back in 4 months.     Bruce Elvera Lennox Juanda Chance, MD, [facility]  Electronically Signed    BRB/MedQ  DD: 01/12/2008  DT: 01/12/2008  Job #: [id number]

## 2011-02-17 NOTE — Assessment & Plan Note (Signed)
[facility]                            CARDIOLOGY OFFICE NOTE   NAME:JOHNSONKymere, Fullington                     MRN:          [id number]  DATE:07/06/2008                            DOB:          02/14/1935    PRIMARY CARE PHYSICIAN:  Dr. Samuel Jester.   CLINICAL HISTORY:  Mr. Deschene returned for a followup visit after he  was seen recently by Dr. Myrtis Ser with symptoms of profound fatigue.  He had  bypass surgery in the remote past and had a drug-eluting stent to  saphenous vein graft to the right coronary artery in December 2008.  He  was quite sick at that time and his ejection fraction was 15-20%, but it  subsequently improved to 40-45%.  He has had systolic congestive heart  failure that has been compensated recently.   A week ago, he saw Dr. Myrtis Ser with symptoms of profound fatigue.  He was  seen in Dr. Silvana Newness office and then transferred down.  Dr. Myrtis Ser could  not identify any specific cause and got blood tests, all of which have  returned normal.  His symptoms improved over the next 24 hours.   His past medical history is significant for:  1. Hypertension.  2. Hyperlipidemia.  3. GERD.   His current medications include furosemide, aspirin, pravastatin,  Plavix, Prilosec, K-Dur, levothyroxine, lisinopril, and gemfibrozil.   On examination, blood pressure 140/70.  There is no venous distension.  Carotid pulses were full without bruits.  Chest is clear.  Heart rhythm  is regular.  There are no murmurs or gallops.  Abdomen is soft with  normal bowel sounds.  There is no hepatosplenomegaly.  Distal pulses are  full with no peripheral edema.   IMPRESSION:  1. Recent episode of fatigue of uncertain etiology, resolved.  2. Coronary artery disease status post remote coronary bypass surgery      and status post placement of a drug-eluting stent to the saphenous      vein graft to the right coronary artery in December 2008.  3. Systolic heart failure,  now compensated and euvolemic.  4. Ejection fraction of 15-20%, improved to 40-45% following      percutaneous coronary intervention.  5. Hypertension.  6. Hyperlipidemia.  7. Gastroesophageal reflux disease.  8. Intolerance to beta-blockers due to bradycardia.   RECOMMENDATIONS:  I think Mr. Higashi is doing well.  I am not certain  regarding the etiology with recent episode.  He may have had a 24-hour  flu syndrome.  We would not make any changes in medication.  We will  plan to see him back in followup in 6 months.      Bruce Elvera Lennox Juanda Chance, MD, [facility]  Electronically Signed    BRB/MedQ  DD: 07/06/2008  DT: 07/07/2008  Job #: [id number]

## 2011-02-17 NOTE — Cardiovascular Report (Signed)
NAME:  KARON, HECKENDORN              ACCOUNT NO.:  [id number]   MEDICAL RECORD NO.:  [id number]          PATIENT TYPE:  INP   LOCATION:  2910                         FACILITY:  [facility]   PHYSICIAN:  Everardo Beals. Juanda Chance, MD, FACCDATE OF BIRTH:  1935-03-10   DATE OF PROCEDURE:  09/09/2007  DATE OF DISCHARGE:                            CARDIAC CATHETERIZATION   CLINICAL HISTORY:  Mr. Soderman is 76 years old and has had prior bypass  surgery and has ischemic cardiomyopathy with ejection fraction of 25%.  He has developed increased symptoms over the last 2 weeks of indigestion  and his electrocardiogram suggested a possible internal diaphragmatic  wall infarction.  We saw him in the clinic yesterday and brought him in  for a catheterization today which was done in the JV lab.  JV  catheterization showed a 95% and 80% stenosis in the distal portion of  the vein graft to the right coronary artery and a new occlusion of the  vein graft to diagonal branch of LAD.  He also had an LVEDP of 36 and  was relatively hypotensive with blood pressures 85-90.  We brought him  up to the catheterization lab urgently for intervention on the lesion in  the vein graft to the right coronary artery.   PROCEDURE:  We first passed a Swan-Ganz catheter.  This was performed  via right femoral vein using a venous sheath.  The Swan-Ganz catheter  was passed through pulmonary artery.  The PCI procedure was performed  via the right femoral artery using arterial sheath and 6-French  preformed coronary catheters.  A front wall arterial puncture was  performed and Omnipaque contrast was used.  The patient was given  antiemetics bolus and infusion that had been loaded with Plavix and  chewable aspirin.  We used a 6-French, JR-4 guiding catheter with side  holes.  We passed the Prowater wire down across the lesion in the distal  portion of the graft and into the native vessel.  We then passed a 4.0  spider filter housed within  the sheath to the distal aspect of the vein  graft.  We removed the Prowater wire and then advanced the spider filter  to the end of the sheath and then removed the sheath deploying the  filter.  We predilated lesion with a 2.5 x 20-mm, Maverick performing  one inflation up to 8 atmospheres x20 seconds.  We then deployed a 2.75  x 23-mm, Promus balloon deploying this with one inflation of 15  atmospheres x20 seconds.  We then deployed a second 2.75 x 18-mm, Promus  stent overlapping and proximal to the previous placed stent.  We  deployed this with one inflation of 15 atmospheres  x30 seconds.  We  then dilated in the middle of both stents with the same balloon up to 16  atmospheres x20 seconds.  The filter was then removed with the retrieval  sheath.  Final diagnostic studies were then performed through the  guiding catheter.  The patient tolerated the procedure well and left the  laboratory in satisfactory condition.   RESULTS:  Initially there were  stenoses in the mid-to-distal and distal  portion of the vein graft to the right coronary estimated at 80% and  95%.  Following placement of overlapping Promus drug-eluting stents, the  stenoses improved from 80% and 95% to 0%.  The flow was TIMI-2 initially  and was TIMI-3 at the end of the procedure.  There was some residual  filling defect proximal to the stents which we elected not to treat.   HEMODYNAMIC DATA:  The right atrial pressure was 3 mean.  The pulmonary  artery pressure was 43/18.  The pulmonary wedge pressure was 16 mean.  Aortic pressure was 102/58 with mean of 75.   CONCLUSION:  Successful PCI of tandem lesions in the mid to distal and  distal portion of the vein graft to the right coronary using  overlapping, Promus drug-eluting stents with improvement in the stenoses  from 80% and 95% to 0% and improving the flow from TIMI-2 to TIMI-3  flow.   DISPOSITION:  The patient was returned for further observation.       Bruce Elvera Lennox Juanda Chance, MD, [facility]  Electronically Signed     BRB/MEDQ  D:  09/09/2007  T:  09/10/2007  Job:  [id number]   cc:   Cardiopulmonary Lab

## 2011-02-17 NOTE — Consult Note (Signed)
NAME:  DONYELL, DING NO.:  [id number]   MEDICAL RECORD NO.:  [id number]          PATIENT TYPE:  INP   LOCATION:  3706                         FACILITY:  [facility]   PHYSICIAN:  Doylene Canning. Ladona Ridgel, MD    DATE OF BIRTH:  [birth year]   DATE OF CONSULTATION:  06/03/2009  DATE OF DISCHARGE:                                 CONSULTATION   REQUESTING PHYSICIAN:  Everardo Beals. Juanda Chance, MD, [facility]   INDICATIONS FOR CONSULTATION:  Evaluation of sinus bradycardia and  ischemic cardiomyopathy, congestive heart failure in the setting of  frequent PVCs and left bundle-branch block.   HISTORY OF PRESENT ILLNESS:  The patient is a 76 year old man who is  status post myocardial infarction several years ago.  He was admitted to  hospital with acute on chronic congestive heart failure symptoms at  rest.  The patient noted that he had been more fatigued and weak over  the last 3 days along with experiencing some peripheral edema.  He  denied dietary indiscretion or medical noncompliance.  He was  hospitalized but he ruled out for MI.  His EF initially had been 15% but  then a year ago on echo that was thought to be improved to 45%.  However, he subsequently underwent catheterization during this  hospitalization.  This demonstrated severe LV dysfunction with an EF of  20%, low cardiac output, and elevated filling pressures.  The patient  has been diuresed and has gradually improved.  He has never had syncope.  He denies much in the way of palpitations, though he does experience  them occasionally.   PAST MEDICAL HISTORY:  Notable for might myocardial infarction status  post bypass surgery in the past, also status post stenting in 2009.  He  has a history of chronic systolic heart failure which previously had  been class II and now has become class III.  He has a history of  gastroesophageal artery reflux disease.  He has a history of  dyslipidemia and a history of hypertension in the past.   SOCIAL HISTORY:  The patient lives with his sister.  He is retired  having previously worked in [employer] for [employer].  He  denies tobacco or ethanol use.   FAMILY HISTORY:  Noncontributory and negative for premature coronary  artery disease.   REVIEW OF SYSTEMS:  As noted in the HPI.  He has mild arthritic  complaints, of course he has class II anginal symptoms.  His all other  systems were reviewed and negative except as noted above.  His family,  however, does note some occasional confusion.   PHYSICAL EXAMINATION:  GENERAL:  He is a pleasant 76 year old man in no  acute distress.  VITAL SIGNS:  Blood pressure was 100/60, the pulse was 60 and irregular,  the respirations were 18, and temperature is 98.  HEENT:  Normocephalic and atraumatic.  Pupils are equal and round.  Oropharynx is moist.  Sclerae are anicteric.  NECK:  No jugular venous distention.  No thyromegaly.  Trachea is  midline.  The carotid are 2+ and symmetric.  LUNGS:  Clear  bilaterally to auscultation.  No wheezes, rales, or  rhonchi are present.  No increased work of breathing.  CARDIOVASCULAR:  Irregular rhythm with normal S1-S2.  There is a soft  systolic murmur at left lower sternal border.  ABDOMEN:  Soft, nontender, and nondistended.  There is no organomegaly.  Bowel sounds are present with no rebound or guarding.  EXTREMITIES:  Demonstrate no cyanosis, clubbing, or edema.  The pulses  are 2+ and symmetric.  NEUROLOGIC:  The patient is alert and oriented x3.  Cranial nerves II-  XII were intact.  Strength was 5/5 and symmetric.   IMAGING:  The EKG demonstrates sinus rhythm with frequent PVCs and a  left bundle-branch block configuration/IVCD.  QRS duration was 130  milliseconds.   IMPRESSION:  1. Ischemic cardiomyopathy with chronic ischemic cardiomyopathy.  2. Congestive heart failure, currently class III.  3. Ventricular bigeminy.  4. Hypokalemia.   DISCUSSION:  I have discussed the  treatment options with the patient.  I  think he would be a satisfactory candidate for BiV ICD implantation with  his congestive heart failure, severe LV dysfunction, and prolonged QRS.  He may also require antiarrhythmic drug therapy in the future with his  ventricular ectopy which I am certain is reducing his cardiac output and  making him more symptomatic.  My recommendation would be that he will be  allowed to be discharged as he is very close to being ready for this  anyway and we will schedule him for outpatient return for BiV ICD  insertion.      Doylene Canning. Ladona Ridgel, MD  Electronically Signed     GWT/MEDQ  D:  06/03/2009  T:  06/04/2009  Job:  [id number]   cc:   Samuel Jester

## 2011-02-17 NOTE — Consult Note (Signed)
NAME:  James [name] NO.:  [id number]   MEDICAL RECORD NO.:  [id number]          PATIENT TYPE:  INP   LOCATION:  1412                         FACILITY:  [facility]   PHYSICIAN:  Duke Salvia, MD, FACCDATE OF BIRTH:  [birth year]   DATE OF CONSULTATION:  05/29/2009  DATE OF DISCHARGE:                                 CONSULTATION   Thank you very much for asking [place] to see James [name] in  consultation because of positive cardiac troponin in the setting of  ischemic heart disease.   James [name] is a 75 year old gentleman with a history of bypass surgery  x4 in 1999.  His ejection fraction in 2005 at catheterization was 25%.  In 2009, he underwent repeat catheterization after he presented with  symptoms of fatigue and weakness, and was found to have high-grade  disease in his vein graft to his right coronary artery and this was  stented with a drug-eluting stent.  At that time, his natives were  totaled.  His LIMA to his lad was patent and his vein graft to his  posterolateral branch of his circumflex was patent, and his vein graft  to his diagonal was totaled.  Ejection fraction as noted was 45%.   In the fall last year, he developed profound fatigue, was admitted and  discharged without anything being found.  He was last seen by Dr. Juanda Chance  in the office about a year ago.  He comes in today because of symptoms  of weakness and fatigue, and just giving out.  He denies specifically  chest pain.  He further denies changes in his chronic dyspnea.  He does  have peripheral edema bilaterally, which is more over the last couple of  days.   His related cardiac history is notable for bradycardia in the past that  has precluded a beta blocker therapy.   PAST MEDICAL HISTORY:  1. In addition, he has a history of hypertension.  2. Dyslipidemia.  3. Gastroesophageal reflux disease.  4. Treated hypothyroidism.   MEDICATIONS AT HOME:  1. Gemfibrozil 600.  2. aspirin  81.  3. Omeprazole 20.  4. Plavix 75.  5. Lisinopril 20.  6. Synthroid 112.  7. Lasix 20.   ALLERGIES:  He denies drug allergies.   SOCIAL HISTORY:  He lives with his sister.  He used to work for  [employer].  He denies use of alcohol, cigarettes, or  recreational drugs.   FAMILY HISTORY:  Noncontributory.   REVIEW OF SYSTEMS:  Negative, except as mentioned above across multiple  organ systems.   EXAMINATION:  CONSTITUTIONAL:  He is an elderly Caucasian male appearing  somewhat older than his stated age of 52.  VITAL SIGNS:  Blood pressure is 127/69, heart rate is 104.  His  respirations are 16 and unlabored.  He is in no acute distress.  HEENT:  Demonstrated no icterus or xanthomata.  Dentition was poor.  NECK: Veins were 10-12 cm.  His carotids were brisk and full bilaterally  without bruits.  BACK:  Without kyphosis or scoliosis.  LUNGS:  Clear.  HEART:  Sounds were regular and distant.  No murmurs, rubs, or gallops  were appreciated.  ABDOMEN:  Soft with active bowel sounds without midline pulsation or  hepatomegaly.  Femoral pulses 2+.  EXTREMITIES:  Distal pulses intact.  There is no cyanosis, clubbing.  There is 2+ peripheral edema bilaterally.  NEUROLOGIC:  Exam is grossly normal.  SKIN:  Warm and dry.   DIAGNOSTIC STUDIES:  Electrocardiogram dated today demonstrated sinus  rhythm at 96 with intervals of 0.16/0.13/0.35 with a QTc of 0.44.  The  axis was leftward at -40.  P wave morphologies were consistent with  atypical sinus node location being biphasic in lead V1 and upright in  lead 2.  There is evidence of a prior anterolateral myocardial  infarction.  Old electrocardiogram is not available for review at this  time, although I am trying to get it.   LABORATORY:  Notable for a troponin that was initially normal,  increasing to 0.64.  MB was also normal, increasing to 5.3.  Basic  metabolic panel was notable for a BUN 29 with creatinine 1.45.  His  CBC  had a white count of 15.2 with a normal hemoglobin.   IMPRESSION:  1. Cardiac enzymes consistent with an non-ST-elevation myocardial      infarction.  2. Unexplained sinus tachycardia with evidence of new edema and      elevated right-sided filling pressures.  3. Ischemic cardiomyopathy.      a.     Status post coronary artery bypass grafting.      b.     Ejection fraction previously at 25% and most recently at 45%       in 2009.      c.     Drug-eluting stent x2 to the right coronary artery vein       graft in 2009 with total native circulation, total vein graft to       the diagonal patent vein graft to the posterolateral, patent left       internal mammary artery to the left anterior descending.  4. History of bradycardia, precluding beta blocker therapy.   MEDICAL DECISION MAKING:  James [name] presents with sinus tachycardia  that is unexplained, evidence of right-sided heart failure, and positive  troponin.  It is possible that this is a left-sided event manifesting  coronary occlusive disease, and he will need to undergo catheterization.  However, I am also bothered by the tachycardia, which would not  necessarily be explained by that.  We will need to look to see what his  thyroid status is, but also undertake CT angiogram to exclude pulmonary  embolism.   We will then begin with heparinization, CT angiogram tonight,  catheterization tomorrow, check his TSH.   Thank you for the consultation.      Duke Salvia, MD, [facility]  Electronically Signed     SCK/MEDQ  D:  05/29/2009  T:  05/29/2009  Job:  [id number]   cc:   Samuel Jester  Fax: [phone]

## 2011-02-17 NOTE — Assessment & Plan Note (Signed)
[facility]                            CARDIOLOGY OFFICE NOTE   BURR, SOFFER                     MRN:          [id number]  DATE:05/04/2008                            DOB:          Feb 15, 1935    PRIMARY CARE PHYSICIAN:  Samuel Jester.   CLINICAL HISTORY:  James [name] is 76 years old and returns for  management of his coronary heart disease.  He was hospitalized last  December with unstable angina, congestive heart failure and underwent  stenting of vein graft to the right coronary artery.  His ejection  fraction at that time was 15-20%, but it subsequently improved to 40-  45%.  He has been doing quite well with no recent chest pain, shortness  breath or palpitations.   PAST MEDICAL HISTORY:  Significant for hypertension, hyperlipidemia and  GERD.  He has a previous lung nodule evaluated by CT and which was  thought not to have done any serious problem.   CURRENT MEDICATION:  Furosemide, aspirin, pravastatin, Plavix,  gemfibrozil, Prilosec, lisinopril, K-Dur and levothyroxine which dose  was recently adjusted.   PHYSICAL EXAMINATION:  VITAL SIGNS:  Blood pressure is 127/60 and pulse  58 and regular.  GENERAL:  There was no venous distension.  The carotid pulses were full  without bruits.  CHEST:  Clear.  CARDIAC:  Rhythm was regular.  There were no murmurs or gallops.  ABDOMEN:  Soft with normal bowel sounds.  EXTREMITIES:  Peripheral pulses were full with no peripheral edema.   IMPRESSION:  1. Coronary artery disease, status post remote coronary bypass surgery      and status post placement of a drug-eluting stents and saphenous      vein graft to the right coronary artery, December 2008.  2. Systolic congestive heart failure, now improved and appears      euvolemic.  3. Ejection fraction 15-20% improved to 40-45%.  4. Hypertension.  5. Hyperlipidemia.  6. Gastroesophageal reflux disease.  7. Intolerance to BETA-BLOCKERS due to  bradycardia.   RECOMMENDATIONS:  I think James [name] is doing quite well.  We will make  any changes in his medications today.  We will get some laboratory work  including BMP and CBC.  I will plan to see him back in 6 months.     Bruce Elvera Lennox Juanda Chance, MD, [facility]  Electronically Signed    BRB/MedQ  DD: 05/04/2008  DT: 05/05/2008  Job #: [id number]

## 2011-02-17 NOTE — H&P (Signed)
NAME:  James [name], James [name] NO.:  [id number]   MEDICAL RECORD NO.:  [id number]          PATIENT TYPE:  EMS   LOCATION:  ED                           FACILITY:  [facility]   PHYSICIAN:  James [name], M.D. DATE OF BIRTH:  1935/01/12   DATE OF ADMISSION:  05/29/2009  DATE OF DISCHARGE:                              HISTORY & PHYSICAL   PRIMARY CARE PHYSICIAN:  Dr. Samuel [name] in [place].   CARDIOLOGIST:  Dr. Charlies [name].   CHIEF COMPLAINT:  Generalized weakness and mild dyspnea on exertion.   HISTORY OF PRESENT ILLNESS:  James [name] is a very pleasant 76 year old  obese and elderly Caucasian gentleman who presents to the hospital with  about 3 days of increased weakness and fatigue over his baseline.  He  has also been describing a little bit more dyspnea upon exertion than  his baseline.  He denies any chest pain.  He denies any orthopnea or  PND.  He sleeps on 2 pillows as he has done all his life.  He do not  raise the head of his bed.  He denies any fever, chills, cough or  dysuria.  No abdominal pain or diarrhea.  Because of these symptoms, his  sister was concerned and decided to call EMS to bring him into the  hospital for further evaluation and management.   ALLERGIES:  He has no known drug allergies.   PAST MEDICAL HISTORY:  Significant for:  1. Coronary artery disease status post CABG.  2. Systolic congestive heart failure with an ejection fraction of 10-      15% that later improved to 40 45%.  3. History of hypertension.  4. Hyperlipidemia.  5. GERD.  6. Hypothyroidism.   HOME MEDICATIONS:  1. Gemfibrozil 600 mg daily.  2. Aspirin 81 mg daily.  3. Omeprazole 20 mg daily.  4. Plavix 75 mg daily.  5. Nitroglycerin 0.4 mg sublingually every 5 minutes x3 as needed for      chest pain.  6. Lisinopril 30 mg daily.  7. Synthroid 112 mcg daily.  8. Lasix 20 mg daily.   SOCIAL HISTORY:  He denies any alcohol, tobacco or illicit drug use.  Lives by  his own in [place].  He has a sister nearby that frequently  assists with taking care of him.   FAMILY HISTORY:  Noncontributory.   REVIEW OF SYSTEMS:  Negative except as already mentioned in HPI.   PHYSICAL EXAMINATION:  VITAL SIGNS:  Upon admission blood pressure  127/69, heart rate 104, respirations 16, O2 saturations 94% on 2 liters  with a temperature of 97.4.  GENERAL:  He is alert, awake, oriented x3, does not appear to be in any  acute distress.  HEENT:  Normocephalic, atraumatic.  Pupils are equal, reactive to light  and accommodation with intact extraocular movements.  NECK:  Supple.  I do not note any JVD, no lymphadenopathy, no bruits.  CHEST:  His lungs are clear to auscultation bilaterally.  HEART:  Regular rate and rhythm.  I cannot auscultate any murmurs, rubs  or gallops.  ABDOMEN:  Soft, nontender, nondistended  with positive bowel sounds.  Obese.  EXTREMITIES:  He has about 2+ pitting edema up to his ankles  bilaterally.  The right is a little more swollen than the left.  NEUROLOGIC:  Exam appears to be grossly intact and nonfocal other than  some bilateral effort dependent weakness that seems to be symmetric.   LABS UPON ADMISSION:  Sodium 138, potassium 4.5, chloride 110, bicarb  23, BUN 29, creatinine 1.45 with a glucose of 116.  All of his liver  function tests are within normal limits.  WBCs 15.2 with an ANC of 12.2,  hemoglobin of 14.3 and a platelet count of 226.  A urinalysis is  negative.  Two sets of point of care markers are negative.  A chest x-  ray shows cardiomegaly, status post CABG with mild interstitial  prominence bilaterally suspicious for early edema.   EKG shows normal sinus rhythm with left axis deviation, a rate of 96 and  questionable PACs with aberrant conduction.   ASSESSMENT AND PLAN:  1. Fatigue and weakness.  Most likely differential diagnoses at this      point remain an early congestive heart failure exacerbation versus       hypothyroidism.  For now will check a TSH.  Will admit him to      telemetry to monitor his heart rhythm.  Will check daily weights as      well as keep him on strict in's and out's to monitor fluid balance.      We will place him on 20 mg twice daily of Lasix, which is double      his normal dose with careful attention to his renal function.  Will      check a BNP.  We will also get physical and occupational therapy      consultations to see if he needs any further assistance at home.  2. For his possible mild systolic CHF exacerbation as above, daily I's      and O's and weights,  Lasix 20 b.i.d. careful with renal function      and adjust Lasix dose as needed.  Also check a BNP.  3. For his hypothyroidism we will check a TSH and continue on his home      dose of Synthroid.  4. For his hyperlipidemia, he is on both a statin as well as a fibrin      and this increases the risk for rhabdomyolysis and myalgias      however, his CPK level is not elevated at this time.  Hence, will      continue both his statin and fibrin will check a fasting lipid      profile in the morning.  5. For his hypertension will continue his home medications.  6. For his gastroesophageal reflux disease, will continue Protonix      while in the hospital.  7. Leukocytosis with an elevated ANC,  source unknown at this time. He      is not taking any steroids.  Chest x-ray does not show any evidence      for pneumonia.  He is afebrile.  Urinalysis does not show any      evidence for UTI.  At this point will continue to monitor his white      count.  I do not see any clinical indication for antibiotics at      this point.  8. For prophylaxis while in the hospital he will be on Protonix for GI  prophylaxis and Lovenox for DVT prophylaxis.      James [name], M.D.  Electronically Signed     EH/MEDQ  D:  05/29/2009  T:  05/29/2009  Job:  [id number]   cc:   James [name]. James Chance, MD, [facility]  [address]  [place], [place] [address]   James [name]  Fax: [phone]

## 2011-02-17 NOTE — Discharge Summary (Signed)
NAME:  James [name], James [name]              ACCOUNT NO.:  [id number]   MEDICAL RECORD NO.:  [id number]          PATIENT TYPE:  INP   LOCATION:  3706                         FACILITY:  [facility]   PHYSICIAN:  Everardo Beals. Juanda Chance, MD, FACCDATE OF BIRTH:  Jul 21, 1935   DATE OF ADMISSION:  05/30/2009  DATE OF DISCHARGE:  06/05/2009                               DISCHARGE SUMMARY   PRIMARY CARE Lorrie Strauch:  Dr. Samuel Jester   CARDIOLOGIST:  Everardo Beals. Juanda Chance, MD, [facility]   ELECTROPHYSIOLOGIST:  Doylene Canning. Ladona Ridgel, MD   DISCHARGE DIAGNOSIS:  Non-ST-segment elevation myocardial infarction.   SECONDARY DIAGNOSES:  1. Coronary artery disease status post previous coronary artery bypass      grafting with 3/4 patent grafts by catheterization this admission      and the patient was medically managed.  2. Acute-on-chronic systolic congestive heart failure/ischemic      cardiomyopathy with ejection fraction of 20% by left      ventriculography this admission.  3. Hypertension.  4. Hyperlipidemia.  5. Gastroesophageal reflux disease.  6. Hypothyroidism.  7. Hypotension felt to be secondary to overdiuresis requiring      readjustment in medication.   ALLERGIES:  No known drug allergies.   PROCEDURE:  Right and left heart cardiac catheterization revealing 3/4  patent grafts, and otherwise, unchanged coronary anatomy.  The patient  was medically managed.  Right heart catheterization revealed a pulmonary  artery pressure of 62/11 with a mean of 35, pulmonary capillary wedge  pressure of 29, right atrium of 13, right ventricle 59/8 with a mean of  22.  Cardiac output 4.16 and cardiac index 1.99.   HISTORY OF PRESENT ILLNESS:  A 76 year old Caucasian male with prior  history of CAD status post CABG along with ischemic cardiomyopathy who  was in his usual state of health until several days prior to admission  when began to experience progressive dyspnea on exertion as well as 2-  pillow orthopnea.  The patient became  concerned and called EMS on May 29, 2009, and was taken to [facility] ED.  There, chest x-ray was  suggestive of early edema.  ECG showed no acute changes.  He was  admitted for further evaluation.   HOSPITAL COURSE:  The patient was placed on IV diuresis by the Internal  Medicine Service and then we were subsequently consulted when his  enzymes returned elevated with a peak CK of 96, a peak MB of 6.3, and a  peak troponin-I of 0.82.  His BNP on admission was 788.0.  Given his  prior history of coronary artery disease with now non-ST-elevation MI,  decision was made to pursue right and left heart cardiac  catheterization.  Of note, the patient had not had any chest pain.  The  patient underwent catheterization on May 31, 2009, revealing 3/4  patent grafts with a known occlusion of the vein graft to the diagonal.  The patient has had multivessel native coronary artery disease with  stable anatomy.  It was felt he could be medically managed.  He was also  found to have elevated right heart pressures  with pulmonary capillary  wedge pressure of 29.  He was aggressively diuresed for a net negative  of approximately 4 liters for the admission with reduction of weight  from 96.3 kg on admission to 91.2 kg at the time of discharge.   James [name] has been seen by Dr. Ladona Ridgel with Electrophysiology and given  his left bundle-branch block and severe ischemic cardiomyopathy, it was  felt that he would benefit from biventricular ICD.  This is going to be  arranged in the outpatient setting.   James [name] was initially ready for discharge on June 04, 2009;  however, he experienced hypotension in the setting of presumed  overdiuresis.  We did adjust his medications at that point and he is now  tolerating than better.  He will be discharged home today in good  condition.   DISCHARGE LABORATORY DATA:  Hemoglobin 14.2, hematocrit 42.2, WBC 9.9,  platelets 171.  Sodium 137, potassium 3.7,  chloride 99, CO2 of 30, BUN  37, creatinine 1.53, glucose 92, total bilirubin 0.8, alkaline  phosphatase 44, AST 20, ALT 15, total protein 6.8, albumin 4.2, calcium  8.9, phosphorus 3.5, magnesium 1.7.  CK 78, MB 4.9, troponin-I of 0.59.  BNP 89.  Total cholesterol 126, triglycerides 90, HDL 36, LDL 70.  TSH  4.027.   DISPOSITION:  The patient will be discharged home today in good  condition.   FOLLOWUP PLANS AND APPOINTMENTS:  We have arranged for followup with Dr.  Juanda Chance on June 21, 2009, at 2:00 p.m.  He will follow up with Dr.  Charm Barges as previously scheduled.  Our office will arrange for him to come  back for biventricular ICD placement.   DISCHARGE MEDICATIONS:  1. Carvedilol 3.125 mg b.i.d.  2. Lasix 40 mg daily.  3. Aspirin 81 mg daily.  4. Colchicine 0.6 mg daily p.r.n.  5. Gemfibrozil 600 mg daily.  6. Lisinopril 30 mg daily.  7. Nitroglycerin 0.4 mg sublingual p.r.n. chest pain.  8. Omeprazole 20 mg daily.  9. Plavix 75 mg daily.  10.Pravachol 40 mg daily.  11.Synthroid 112 mcg daily.  12.Tylenol 500 mg 1 tablet q.6 h. P.r.n.   OUTSTANDING LABORATORY DATA AND STUDIES:  None.   DURATION OF DISCHARGE ENCOUNTER:  40 minutes including physician time.      Nicolasa Ducking, ANP      Bruce R. Juanda Chance, MD, [facility]  Electronically Signed    CB/MEDQ  D:  06/05/2009  T:  06/06/2009  Job:  [id number]   cc:   Samuel Jester

## 2011-02-17 NOTE — Assessment & Plan Note (Signed)
[facility]                            CARDIOLOGY OFFICE NOTE   ADD, DINAPOLI                     MRN:          [id number]  DATE:09/21/2007                            DOB:          09/20/1935    DATE OF VISIT:  September 21, 2007.   This is a 76 year old, white male patient of Dr. Charlies Constable, who  presented to the hospital with an out-of-hospital MI.  He was taken to  the cardiac cath lab on September 09, 2007, in which he became seriously  and critically with ongoing ischemia.  He also had severe LV dysfunction  and ejection fraction of 15% and became hypotensive with a markedly  elevated filling pressure.  He was taken to the main lab where he  underwent successful PCI of tandem lesions in the mid to distal and  distal portion of the vein graft to the RCA using overlapping Promise  drug-eluting stents with improvement from 80 and 95% down to zero.  The  patient recovered well, he was not able to tolerate beta blockers due to  bradycardia, and his heart failure resolved.  He is brought to the  office today by both his sisters.  He is doing quite well.  They are  doing their very best to buy low sodium products and avoid it at all  cost.  The patient denies any chest pain, he does have chronic shortness  of breath that has not changed.  He denies any orthopnea, paroxysmal  nocturnal dyspnea, or worsening dyspnea on exertion, and he denies any  edema, palpitations, dizziness, or presyncope.   CURRENT MEDICATIONS:  1. Levothyroxine 0.137 mg a day.  2. Aspirin 81 mg a day.  3. Lisinopril 20 mg one-and-a-half daily.  4. Omeprazole 20 mg daily.  5. K-Dur 20 mEq one-half daily.  6. Gemfibrozil 600 mg daily.  7. Pravastatin 40 mg daily.  8. Furosemide 20 mg daily.  9. Plavix 75 mg daily.   PHYSICAL EXAMINATION:  GENERAL:  This is a pleasant, 76 year old, white  male in no acute distress.  VITAL SIGNS:  Blood pressure 127/74, pulse 61, weight  215.  NECK:  Without JVD, a sharp bruit, or thyroid enlargement.  LUNGS:  Clear anterior, posterior, and lateral.  HEART:  Regular rate and rhythm at 60 beats per minute, normal S1 and  S2, no significant murmur, rub, bruit, thrill, or heave noted.  ABDOMEN:  Soft without organomegaly, masses, lesions, or abnormal  tenderness.  The right groin is without hematoma or hemorrhage.  EXTREMITIES:  Without cyanosis, clubbing, or edema; he has good distal  pulses.   IMPRESSION:  1. Recent out-of-hospital myocardial infarction treated with drug-      eluting stent x2 to the saphenous vein graft to the right coronary      artery on September 09, 2007, with residual patent saphenous vein      graft to the posterolateral artery of the circumflex with 80%      systolic compression in the proximal portion, patent left internal      mammary artery to the left anterior descending  with 50% narrowing      in the distal left anterior descending, occluded saphenous vein      graft to the diagonal branch of the left anterior descending new      since last study, apical wall hypokinesis, inferior wall      hypokinesis, ejection fraction 15 to 20%, and 2+ mitral      regurgitation.  2. Acute on chronic systolic heart failure, resolved.  3. Hypertension.  4. Bradycardia, need to avoid beta blockers.  5. Hyperlipidemia.  6. Status post coronary artery bypass grafting in 1999 with a left      internal mammary artery to the left anterior descending, saphenous      vein graft to the diagonal, saphenous vein graft to the posterior      descending artery, and saphenous vein graft to the posterolateral      artery of the circumflex.  7. Gastroesophageal reflux disease.  8. Hypothyroidism.  9. Obesity.  10.Right lung nodule.  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   PLAN AT THIS TIME:  The patient looks quite well today, there is no  evidence of heart failure, and he is doing well.  At some point, we may  have to consider  a defibrillator given the severe LV dysfunction, and  Dr. Juanda Chance will discuss that with him when he sees him on January 19th.      Jacolyn Reedy, PA-C  Electronically Signed      Noralyn Pick. Eden Emms, MD, [facility]  Electronically Signed   ML/MedQ  DD: 09/21/2007  DT: 09/21/2007  Job #: [id number]

## 2011-02-17 NOTE — Discharge Summary (Signed)
NAME:  James [name], James [name]              ACCOUNT NO.:  [id number]   MEDICAL RECORD NO.:  [id number]          PATIENT TYPE:  INP   LOCATION:  2009                         FACILITY:  [facility]   PHYSICIAN:  Everardo Beals. Juanda Chance, MD, FACCDATE OF BIRTH:  28-Nov-1934   DATE OF ADMISSION:  09/09/2007  DATE OF DISCHARGE:  09/12/2007                         DISCHARGE SUMMARY - REFERRING   DISCHARGE DIAGNOSES:  1. Atypical chest discomfort with probable out of hospital myocardial      infarction.  2. Progressive coronary artery disease.  3. Status post drug-eluting stent x2 to the saphenous vein graft to      the RCA.  Will continue medical treatment for residual disease.  4. Acute on chronic systolic congestive heart failure.  5. Hypertension.  6. Bradycardia, avoid beta blockers.  7. Hyperlipidemia.  8. History as noted below.   PROCEDURES PERFORMED:  1. Cardiac catheterization with 2 drug-eluting stents performed to the      saphenous vein graft to the RCA by Dr. Charlies Constable on September 09, 2007.   ADMITTING PHYSICIAN:  Everardo Beals. Juanda Chance, MD.   DISCHARGING PHYSICIAN:  Everardo Beals. Juanda Chance, MD   PRIMARY CARE PHYSICIAN:  Samuel Jester   SUMMARY OF HISTORY:  James [name] is a 76 year old white male who  presented to the office on September 08, 2007 with epigastric burning and  frequent belching with associated shortness of breath and mild  diaphoresis.  He denied any chest discomfort, and he describes his  anginal equivalent as bilateral scapular discomfort and shoulder  discomfort which he was not experiencing.  He continued to feel and look  poorly; thus, his sister called EMS.  On their arrival the patient  stated he felt better, and did not want to go the hospital; thus they  left.  After taking Rolaids, Pepto-Bismol, and vinegar he did feel  slightly better.  However, he continued to have feelings described  above.  He feels that the symptoms are worse when he is lying flat, and  notes that he  has been sleeping in a recliner at night, although his  weight has been down 6-7 pounds; and he denies any orthopnea or PND.   ALLERGIES:  IV DYE allergy.   PAST MEDICAL HISTORY:  1. Ischemic cardiomyopathy with a history of bypass surgery in 1999      with a LIMA to the LAD, saphenous vein graft to the diagonal,      saphenous vein graft to the PDA, saphenous vein graft to the      posterolateral branch of the circumflex.  2. Last catheterization September 2005 showed three-vessel coronary      artery disease, patent grafts in the EF at 25%.  3. He has occasional bradycardia that has been associated with a beta      blocker as evidenced by Card[employer]Net monitoring January 07, 2007.  Beta-      blocker was discontinued.  4. He also has a history of hypertension.  5. Hyperlipidemia.  6. GERD.  7. Hypothyroidism.  8. Obesity.  9. Right lung nodule.  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   LABORATORY DATA:  On admission H&H was 15.0 and 43.2, normal indices,  platelets 279, WBC 12.7.  PTT 41.1,  PT 12.3.  Sodium 137, potassium  4.2, BUN 18, creatinine 1.0, glucose 109.  On the 5th H&H was 12.7 of  37.7, normal indices, platelets 268, WBC 17.2.  On the 6th LFTs were  within normal limits.  At the time of discharge on the 8th H&H was 13.1  and 38.0, normal indices, platelets 258, WBCs 10.3, sodium 136,  potassium 3.6, BUN 23, creatinine 0.99.  BNP on the 6th was 43, and on  the 7th 362.  Fasting lipids on the 6th showed a total cholesterol of  140, triglycerides 159, HDL 30, LDL 78.  TSH 0.4620.  Cortisol level on  the 8th with 13.  Urinalysis on the 6th was notable for a small amount  of leukocytes and urine culture was unremarkable.  EKGs showed normal  sinus rhythm, left axis deviation, interventricular conduction delay.  Several EKGs have shown frequent PVCs.  Chest x-ray on the 7th showed  left basilar atelectasis versus scar, left  ventricular calcifications  were stable.  No new oval density at the left apex.  Pulmonary nodule  was not excluded.  Consider CT scanning.   HOSPITAL COURSE:  The patient was admitted to the hospital and underwent  cardiac catheterization by Dr. Juanda Chance on December 5.  He had native 3-  vessel coronary artery disease.  The LIMA to the LAD was patent.  There  was a systolic compromise in the saphenous vein graft to the circumflex.  The saphenous vein graft to the RCA lesion had thrombus, a mid 80% and  distal 90% lesion, EF was 15-20% with multiple wall motion  abnormalities.  After review, Dr. Juanda Chance performed stenting to the  saphenous vein graft to the RCA reducing the 80% and 95% lesions to 0%  and restoring TIMI 2 flow.  Dr. Juanda Chance suspected that he has had a  recent myocardial infarction.  By the 5th he was no longer having any  further epigastric discomfort.  Blood pressure remained in the upper  80s.  The patient stated that he felt much better.  Cardiac rehab began  education, and began assisting with ambulation.  Over the weekend of the  6th he did well, and activity was increased.  Urine culture and  urinalysis was obtained, and did not show any evidence of infection.  Dopamine was weaned, and by the 8th he was on 2000.  He was ambulating  with assistance.  Dr. Juanda Chance, after review, felt that the patient could  be discharged home.  However, prior to discharge, Dr. Juanda Chance ordered a  troponin as the enzymes were not ordered on admission.  He also asked  Kennon Rounds to review for a possible adept trial.  Kennon Rounds, with the research  team, was planning to draw blood work prior to his discharge.  Dr.  Juanda Chance felt that he did not have to remain in the hospital to await  blood results from the troponins.   DISPOSITION:  The patient is discharged home to the care of his family.  He is asked to maintain low-sodium heart-healthy diet.  Wound care per  supplemental sheet.  He is asked to bring  all medications to all  appointments.   MEDICATIONS INCLUDE:  1. New Meds:  Plavix 75 mg daily.  Otherwise he was asked to continue his home medications which include:  1. Levothyroxine 137 mcg daily.  2. Aspirin 81 daily.  3. Lisinopril 20 mg half a tablet daily.  4. Omeprazole 20 mg daily.  5. K-Dur 10 mEq half tablet daily.  6. Gemfibrozil 600 mg daily.  7. Pravastatin 20 mg nightly.  8. Lasix 20 mg daily.  9. Nitroglycerin 0.4 as needed.   He is asked to bring all medications to all appointments.  He initially  had an appointment with Dr. Regino Schultze PA on September 21, 2007 at 8:45;  however, this is moved to 11:45 so that Dr. Juanda Chance may also have a  chance to see him as well.   DICTATING DISCHARGE TIME:  40 minutes.      Joellyn Rued, PA-C      Bruce R. Juanda Chance, MD, [facility]  Electronically Signed    EW/MEDQ  D:  09/12/2007  T:  09/12/2007  Job:  [id number]   cc:   Samuel Jester

## 2011-02-17 NOTE — Assessment & Plan Note (Signed)
[facility]                            CARDIOLOGY OFFICE NOTE   James [name], James [name]                       MRN:          [id number]  DATE:04/26/2007                            DOB:          March 08, 1935    PRIMARY CARE PHYSICIAN:  Dr. Samuel Jester.   CLINICAL HISTORY:  James [name] is 76 years old and returned for followup  management of his coronary heart disease.  He had bypass surgery in 1999  and was last studied in September of 2007 at which time all his grafts  were patent and he had an ejection fraction of 25%.  He was hospitalized  in March of 2008 with dizziness and subsequently had documented heart  rate in the 40's and his Coreg was decreased and then discontinued.   He says he has done fine since then.  He has had no chest pain,  shortness of breath or palpitations.   PAST MEDICAL HISTORY:  Significant for hypertension, hyperlipidemia, and  an abnormal chest CT which showed a 3 mm right lung nodule which is  being followed by Dr.  Delford Field.  He also has a porcelain gallbladder that was found on  incidental CT scan and he saw Dr. Claudette Head in consultation who  recommended that he consider surgical removal but James [name] has been  disinclined to do this.   CURRENT MEDICATIONS:  Levothyroxine, aspirin, Lisinopril, Prilosec, K-  Dur, gemfibrozil, Pravastatin and furosemide. He is not on Coreg due to  bradycardia.   PHYSICAL EXAMINATION:  VITAL SIGNS:  Blood pressure 134/68, pulse 56 and  regular.  NECK:  There is no venous distention. The carotid pulses are full  without bruits.  CHEST:  Clear.  HEART:  Cardiac rhythm was regular.  I could hear no murmurs or gallops.  ABDOMEN:  Soft, normal bowel sounds.  There is no hepatosplenomegaly.  Peripheral pulses are full and there is no peripheral edema.   An electrocardiogram showed an interventricular conduction delay and an  anterolateral wall infarction.   IMPRESSION:  1. Coronary  artery disease status post coronary artery bypass graft      surgery in 1999 with recent documentation of patent grafts in      September, 2007.  2. Ischemic cardiomyopathy with an ejection fraction of 25%.  3. Class II congestive heart failure now euvolemic and compensated.  4. History of presyncope and bradycardia in March, now resolved off      beta blockers.  5. Hypertension.  6. Hyperlipidemia.  7. Right lung nodule.  8. Porcelain gallbladder.   RECOMMENDATIONS:  James [name] is doing fairly well at present.  He is  still disinclined to have gallbladder surgery and with his cardiac risks  I think that is not unreasonable with him being asymptomatic.  Will plan  to continue his current medications and I will see him back in 6 months.     Bruce Elvera Lennox Juanda Chance, MD, [facility]  Electronically Signed    BRB/MedQ  DD: 04/26/2007  DT: 04/26/2007  Job #: [id number]   cc:   Samuel Jester

## 2011-02-17 NOTE — Assessment & Plan Note (Signed)
[facility]                            CARDIOLOGY OFFICE NOTE   NAME:James [name], James [name]                     MRN:          [id number]  DATE:06/27/2008                            DOB:          09-13-1935    [name] is followed by Dr. Juanda Chance.  He has coronary disease, and he  is post CABG in the remote past.  He had a drug-eluting stent a  saphenous vein graft of the right coronary artery in December of 2008.  He had had severe left ventricular dysfunction, but it improved, and his  ejection fraction improved to 40-45%.  Dr. Juanda Chance saw him last on May 04, 2008.  He has done relatively well since that time.  In the past 24-  48 hours, he has felt poorly.  It is very difficult to describe  exactly how he feels.  He is mildly weak.  He is here with his family.  His daughter describes possible cycles of him looking slightly pale and  then flushed.  He also has some tremors of his hands.  This does not  appear to be reproducible.  It is not related to exertion.  There is  question that he had slight increased shortness of breath when lying  down last night.   This morning, he took his medicines around 5:30 a.m.  His blood pressure  seemed to vary in the range of 130/70 with a pulse of 70 at 6:30 a.m.  followed by 125/73 with a pulse of 58 at 7:00 a.m. followed by 119/43  with a pulse as low as 40 at 7:25 a.m., and then his blood pressure was  146/69 with a pulse of 62 at 7:55 p.m.  Some of this data may suggest  that he has some bradycardia from his medications; however, he is not on  a beta-blocker.   PAST MEDICAL HISTORY:   ALLERGIES:  1. CODEINE.  2. CELEBREX.   MEDICATIONS:  1. Omeprazole 20.  2. Pravachol 60.  3. Levoxyl 0.112.  4. Gemfibrozil 600 b.i.d.  5. Plavix 75.  6. Aspirin 81.   OTHER MEDICAL PROBLEMS:  See the list below.   REVIEW OF SYSTEMS:  Other than the description in the HPI, his review of  systems is negative.   PHYSICAL EXAMINATION:  VITAL SIGNS:  Blood pressure is 124/66 with a  pulse of 54.  GENERAL:  The patient is oriented to person, time and place.  Affect is  normal.  HEENT:  Reveals no xanthelasma.  He has normal extraocular motion.  He  has an unusual shape to his right ear lobe.  There are no carotid  bruits.  There is no jugular venous distention.  LUNGS:  Clear.  Respiratory effort is not labored.  CARDIAC:  An S1 with an S2.  There are no clicks or significant murmurs.  ABDOMEN:  His abdomen is obese but soft.  He has trace peripheral edema.   ELECTROCARDIOGRAM:  EKG sent to US shows that he has an incomplete right  bundle branch block.  There is sinus rhythm with scattered PVCs.  PROBLEMS:  1. History of coronary disease post CABG in the remote past and a drug-      eluting stent to the SVG to the right coronary artery in December      of 2008.  2. History of systolic congestive heart failure.  At this time, I      believe his volume is stable.  He has trace edema.  I have chosen      not to change his medicines.  3. History of ejection fraction as low as 15-20% but then improved to      40-45%.  4. Hypertension, stable.  5. Hyperlipidemia, treated.  6. History of GERD, treated.  7. Intolerance to beta blockers with bradycardia.  8. Question of gout.  9. PVCs noted on his EKG today.  10.* Overall feeling of feeling poorly.  The exact etiology is not      clear to me.  I do not believe that this represents significant      cardiac decompensation.  We will check CBC, a BMET, liver function      studies and a TSH.  He will then see Dr. Juanda Chance for early follow      up.  Otherwise, he will go about normal activity.     Luis Abed, MD, [facility]  Electronically Signed    JDK/MedQ  DD: 06/27/2008  DT: 06/27/2008  Job #: [id number]   cc:   Prudy Feeler, N.P.

## 2011-02-20 NOTE — Discharge Summary (Signed)
   NAME:  James [name], James [name]                      ACCOUNT NO.:  [id number]   MEDICAL RECORD NO.:  [id number]                   PATIENT TYPE:  INP   LOCATION:  3741                                 FACILITY:  [facility]   PHYSICIAN:  Joellyn Rued, P.A. [facility]              DATE OF BIRTH:  12/08/1934   DATE OF ADMISSION:  11/28/2002  DATE OF DISCHARGE:  12/04/2002                           DISCHARGE SUMMARY - REFERRING   HISTORY OF PRESENT ILLNESS:  Dictation ended here.                                               Joellyn Rued, P.A. [facility]    EW/MEDQ  D:  12/04/2002  T:  12/04/2002  Job:  [id number]   cc:   Charlies Constable, M.D. [facility]   Dr. Earnstine Regal, [facility]   [place]

## 2011-02-20 NOTE — Discharge Summary (Signed)
NAME:  James [name], James [name]                      ACCOUNT NO.:  [id number]   MEDICAL RECORD NO.:  [id number]                   PATIENT TYPE:  INP   LOCATION:  3741                                 FACILITY:  [facility]   PHYSICIAN:  Ermalene Searing. Leander Rams, M.D.                DATE OF BIRTH:  [birth year]   DATE OF ADMISSION:  11/28/2002  DATE OF DISCHARGE:  12/05/2002                                 DISCHARGE SUMMARY   PRIMARY CARE PHYSICIAN:  Dr. Charm Barges in [place].  Patient is also followed  by the [place] in [place].  The patient's cardiologist is Charlies Constable, M.D. [facility], [place]   DISCHARGE DIAGNOSES:  1. Coronary artery disease status post myocardial infarction in February     1999 and status post stent in February 1999.  Status post coronary artery     bypass grafting September 1999.  2. Hyperlipidemia.  3. Gastroesophageal reflux disease.  4. Hypertension.  5. Cardiomyopathy with ejection fraction 35%.  6. Hypothyroidism.   DISCHARGE MEDICATIONS:  1. Levofloxacin eye drops each eye every four hours for one week.  2. Lopressor 25 mg t.i.d.  3. Zocor 20 mg at bedtime.  4. Levothyroxine 0.137 mg daily.  5. Lisinopril 20 mg daily.  6. Aspirin 325 mg daily.  7. Zantac 150 mg p.o. b.i.d.  8. Nitroglycerin 0.4 mg sublingual PRN.   ALLERGIES:  No known drug allergies.   PROCEDURE:  Cardiac catheterization performed on December 04, 2002 by Dr.  Antoine Poche.  Findings include left main coronary arteries normal, left  anterior descending occluded in the mid segment after second septal  perforator, distal vessel was diffusely diseased and was seen to fill via  the left internal mammary artery.  This was a small vessel.  Circumflex was  occluded with a mid segment of AV groove.  A moderate sized mid obtuse  marginal was occluded at the ostium with long 70 to 80% proximal stenosis.  It was seen to fill via  vein graft.  The right coronary artery was occluded  after an acute marginal branch.  The distal vessel was  seen to fill via vein  graft.  The vein graft was sewn to a posterior descending artery and back-  filled a large posterior lateral.  The mid and distal posterior descending  artery was small and diffusely diseased.  Assessment of the grafts included  left internal mammary artery to the left anterior descending patent  perfusing a small diffusely diseased mid and apical left anterior  descending, a saphenous vein graft to the posterior descending artery was  patent, back-filling a large posterior lateral,  saphenous vein graft to the  diagonal was patent.  This is a small vein graft in a small branch.  There  was proximal 25% stenosis.  The saphenous vein graft to the mid obtuse  marginal was patent.  Conclusions included severe three vessel coronary  artery disease with patent grafts, small vessel  coronary artery disease and  ischemic cardiomyopathy.   HISTORY OF PRESENT ILLNESS:  The patient is a 76 year old man who presented  to the emergency department reporting that approximately 9 PM on the  night  of presentation while sitting on the couch he noted onset of diaphoresis and  felt bad.  His sister transported him to the emergency room.  He had a  similar episode last year and was not admitted.  He denies recent history of  orthopnea, dyspnea on exertion, syncope, chest pain, nausea and vomiting.  He was seen in the emergency department by Dr. Dietrich Pates who recommended  admission by internal medicine.  He had been in the emergency room over six  hours at the time of evaluation by Dr. Sherin Quarry.  By that time the  patient had two sets of cardiac enzymes which included a troponin of 0.04  with CK of 351 and MB of 2.9 and troponin of 0.06 with CK 973 and MB of 6.0.   HOSPITAL COURSE:  The patient was admitted to telemetry unit and placed on  rule out myocardial infarction protocol.  The patient was maintained on his  outpatient medications.  The third set of the patient's cardiac  enzymes  included a total CK of 1825 with MB 7.6 and troponin of 0.06.  Total  cholesterol was 151, triglycerides 313, HDL 29, LDL 59.  D-dimer was within  normal limits.  TSH was within normal limits.  Urinalysis was negative.  Chest x-ray showed low lung volumes with patchy bibasilar opacities  compatible with atelectasis, no edema or local infiltrate was demonstrated.   The patient was placed on Lovenox for acute coronary syndrome.  Dr.  Marvel Plan team was consulted for cardiology recommendations.  They  suggested that after the patient's cardiac catheterization the patient be  medically managed including risk reduction, most especially smoking  cessation and weight loss.   The patient remained asymptomatic after his admission, did not have any  further diaphoresis, chest pain, shortness of breath or discomfort.  Telemetry monitoring revealed sinus rhythm to sinus bradycardia.  There were  no acute electrocardiogram changes during this stay.  Cardiac  catheterization results are as noted.  The patient's vital signs remained  stable throughout his stay as well.  Vital signs at the time of discharge  are temperature 97.6, blood pressure 138/72, heart rate 65, respirations 18,  room air saturations 96%.  The patient is to follow up as listed below and  is discharged on the above noted medications.   CONSULTATIONS:  Dr. Dietrich Pates, [facility].   LABORATORY DATA:  On discharge the sodium was 142, potassium 3.4, BUN 10,  creatinine 0.8, serum glucose 101, blood cultures are negative X2.  White  blood cell count 7.9, hemoglobin 12.4, hematocrit 35.2, platelet count  183,000.  Urinalysis was negative for infection,  protein, or ketones.   DISPOSITION:  The patient is discharged to home.   FOLLOW UP:  The patient is to see his primary care physician within one to  two weeks and is to call Dr. Jenene Slicker office to set up further follow up for his cardiology concerns.     Ellender Hose. Sharol Given. Leander Rams, M.D.    SMD/MEDQ  D:  12/05/2002  T:  12/05/2002  Job:  [id number]   cc:   [place] Bing, M.D. [facility],  M.D. [facility]   Dr. Earnstine Regal, [place]

## 2011-02-20 NOTE — Assessment & Plan Note (Signed)
[facility]                             PULMONARY OFFICE NOTE   WOODFIN, KISS                       MRN:          [id number]  DATE:01/13/2007                            DOB:          1934-11-13    CHIEF COMPLAINT:  Evaluate lung nodule.   HISTORY OF PRESENT ILLNESS:  This is a 76 year old male who is referred  for evaluation of lung nodule. He had an abnormal CT scan of the chest  upon evaluation of his cardiac syndromes. He denies any chest pain. He  is short of breath with exertion ever since his bypass surgery in 1999.  He is an ex-smoker since 1999, having smoked 2 packs a day for 45 years.  He denies any current mucus production. No chest pain. Has occasional  reflux, on medication. Has minimal nasal congestion and drainage. Denies  any hemoptysis or chest discomfort. Does have minimal acid reflux  symptoms, some nasal congestion noted. No abdominal pains, difficulty  swallowing, sore throat, tooth or dental problems. The patient had a  recent CT scan obtained and it showed a nodule in the right lung. We are  asked to evaluate the same.   PAST MEDICAL HISTORY:   MEDICAL:  1. History of hypertension.  2. Heart disease with coronary artery disease with previous bypass      surgery in 1999.  3. History of hypercholesterolemia.  4. History of sleep apnea, not treated.   MEDICATION ALLERGIES:  IV CONTRAST DYE.   CURRENT MEDICATIONS:  1. Levothyroxine 0.137 mg daily.  2. Aspirin 81 mg daily.  3. Lisinopril 30 mg daily.  4. Omeprazole 20 mg daily.  5. Potassium 10 mEq daily.  6. Gemfibrozil 600 mg daily.  7. Pravastatin 20 mg daily.  8. Coreg 3.125 mg b.i.d.  9. Lasix 20 mg daily.   SOCIAL HISTORY:  Smoking history as noted above. The patient is retired  and lives with his sister. Is single.   FAMILY HISTORY:  Cancer. Mother died of ovarian cancer.   REVIEW OF SYSTEMS:  Otherwise, is noncontributory.   PHYSICAL EXAMINATION:   Temperature 98.0, blood pressure 120/70, pulse  57, saturation is 97% on room air. Weight is 220 pounds.  CHEST: Showed distant breath sounds. No evidence of wheeze, rales or  rhonchi.  CARDIAC: Showed a regular rate and rhythm without S3. Normal S1, S2.  ABDOMEN: Soft, nontender.  EXTREMITIES: Showed no edema or clubbing.  SKIN: Was clear.  NEUROLOGIC: Was intact.  HEENT: Showed no jugular venous distention. No lymphadenopathy.  Oropharynx was clear.  NECK: Supple.   LABORATORY DATA:  CT scan of the chest was obtained and reviewed and  revealed a right middle lobe nodule which is only a half a millimeter in  diameter and is really not malignant in appearance and likely benign. No  other abnormalities seen.   IMPRESSION:  Lung nodule, likely benign in nature.   RECOMMENDATIONS:  Followup CT scan in 6 months time. The presence of  this nodule should not dissuade any aggressive cardiac therapy that is  necessary per cardiology.  Charlcie Cradle Delford Field, MD, [facility]  Electronically Signed    PEW/MedQ  DD: 01/13/2007  DT: 01/13/2007  Job #: [id number]   cc:   Everardo Beals. Juanda Chance, MD, [facility]  [name], MD, [facility]

## 2011-02-20 NOTE — Assessment & Plan Note (Signed)
[facility]                            CARDIOLOGY OFFICE NOTE   James [name], James [name]                       MRN:          [id number]  DATE:02/01/2007                            DOB:          1935-02-21    PATIENT PROFILE:  A 76 year old Caucasian male with prior history of  coronary artery disease and ischemic cardiomyopathy who was recently  seen secondary to presyncope presents for follow up.   PROBLEM LIST:  1. Coronary artery disease.      a.     Status post coronary artery bypass graft surgery (CABG) x4       in 1999 with a left internal mammary artery (LIMA) to the left       anterior descending (LAD) vein graft to the diagonal, vein graft       to the PDA and vein graft to the posterolateral branch of the       circumflex.      b.     June 27, 2007 cardiac catheterization: Severe native       vessel disease with occlusion of the circumflex, right coronary       artery and left anterior descending and patent grafts.  Ejection       fraction at that time was 25%.  2. Ischemic cardiomyopathy.      a.     April 02, 2005, a 2-D echocardiogram:  Left ventricular       function appeared relatively good and perhaps mildly depressed,       poorly visualized.      b.     January 20, 2007, a 2-D echocardiogram significant dilatation       of the left ventricular with relatively good movement at the base       and mid ventricular, however potentially no movement at the apex.       Again, poor visualization.  Possible that overall left ventricular       function not significantly reduced.  Mild mitral regurgitation       (MR).  3. Presyncope in March of 2008.      a.     CardioNet monitoring in April of 2008:  Patient asymptomatic       with occasional drops in heart rate into the 40's during periods       of sleep.  4. Hypertension.  5. Hyperlipidemia.  6. Sinus bradycardia.  7. Treated hypothyroidism.  8. Obesity.  9. Abnormal chest CT with 3  millimeter right lung nodule.  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   HISTORY OF PRESENT ILLNESS:  A 76 year old Caucasian male with the above  problem list who was last seen in clinic in April 2008.  At that point  he was seen in follow up to a emergency room visit for presyncope.  Subsequent to his January 05, 2007 visit he has worn a CardioNet monitor,  which has shown some sinus bradycardia even as low as down into the 40's  primarily during periods of sleep.  However, he has been asymptomatic  without any recurrent  presyncope.  He also underwent 2-D echocardiogram  which showed poor visualization of the left ventricular and the  possibility that left ventricular function was not significantly  reduced.  He has also followed up with pulmonary grafts to his pulmonary  nodule and they have recommended follow up CT in about 6 months.  Finally he is followed with GI secondary to the incidental finding of  porcelain gallbladder on CT in March.  It appears to have been  recommended that he have cholecystectomy, however he is sort of hedging  as to whether or not he is actually going to have this done.   He is not very active at home.  He does walk around the house and denies  any chest pain or shortness of breath.  Otherwise he has been feeling  relatively well.   HOME MEDICATIONS:  1. Lasix 20 mg daily.  2. Levothyroxine 0.137 mg daily.  3. Aspirin 81 mg daily.  4. Lisinopril 20 mg 1 1/2 tablets daily.  5. Omeprazole 20 mg daily.  6. K-Dur 20 mEq 1/2 a tablet daily.  7. Gemfibrozil 600 mg daily.  8. Pravastatin 40 mg 1/2 a tablet daily.  9. Coreg 3.125 mg b.i.d.   PHYSICAL EXAMINATION:  Blood pressure 138/71, heart rate 53,  respirations 16, weight is 227 pounds.  A pleasant white male in no acute distress, awake, alert and oriented  x3.  NECK:  No bruits or jugular venous distension.  LUNGS:  Respirations are unlabored, clear to auscultation.  CARDIAC:   Regular S1, S2 with a soft systolic flow murmur at the upper  sternal border.  ABDOMEN:  Round, soft, nontender, nondistended, bowel sounds present.  EXTREMITIES:  Warm and dry, pink, no clubbing, cyanosis or edema.   ASSESSMENT OF CLINICAL FINDINGS:  See problem list.   ASSESSMENT/PLAN:  1. Coronary artery disease.  The patient is doing well from a cardiac      standpoint without any chest pain or shortness of breath.  He      remains on aspirin, angiotensin converting enzyme (ACE) inhibitor,      beta blocker and statin therapy.  2. Ischemic cardiomyopathy, appears euvolemic.  Continue beta blocker,      angiotensin converting inhibitor and low dose Lasix.  3. Presyncope.  He has had no recurrence over the last several weeks.      We will make no changes to his regimen today.  He has had some      bradycardia on the CardioNet monitor although this has all been      asymptomatic and almost exclusively occurring during periods of      sleep.  We will keep his beta blocker dose where it is at.  4. Hypertension.  I will make no changes today.  5. Hyperlipidemia.  He remains on Gemfibrozil and Pravachol therapy,      which he is tolerating well.  6. Porcelain gallbladder.  He has been seen by Dr. Russella Dar with      recommendations for cholecystitis.  The patient at this point      prefers not to undergo any surgery as he feels as though he is      asymptomatic, although he understands that this type of finding of      the gallbladder on CT could put him at increased risk for      gallbladder cancer.  If at some point a decision is made to pursue      cholecystectomy he would  require a cardiac preoperative risk      stratification with Myoview testing.  7. Lung nodule, followed by pulmonary.  Follow up CT in 6 months.  8. History of mild carotid disease.  He will have repeat carotid      ultrasound in June of this year.  DISPOSITION:  Follow up with Dr. Juanda Chance in the next 2-3  months.      Nicolasa Ducking, ANP  Electronically Signed      Everardo Beals. Juanda Chance, MD, [facility]  Electronically Signed   CB/MedQ  DD: 02/01/2007  DT: 02/02/2007  Job #: [id number]

## 2011-02-20 NOTE — Assessment & Plan Note (Signed)
[facility]                            CARDIOLOGY OFFICE NOTE   CORRIGAN, KRETSCHMER                       MRN:          [id number]  DATE:01/05/2007                            DOB:          1935/01/02    PRIMARY CARDIOLOGIST:  Everardo Beals. Juanda Chance, MD, F.A.C.C.   PRIMARY CARE PHYSICIAN:  Dr. Samuel Jester, [place].   HISTORY OF PRESENT ILLNESS:  James [name] is a very pleasant 76 year old  male patient, followed by Dr. Juanda Chance, with the history of coronary  disease, status post bypass surgery in 1999 and ischemic cardiomyopathy  with an EF of about 25-35%.  By last catheterization in 2005, he had a  patent vein graft to the PDA, patent vein graft to the posterolateral  branch of the circumflex, 50% narrowing in the vein graft to the  diagonal branch of the LAD and patent LIMA graft to the LAD and his EF  was 25%.  His last echocardiogram, done in 2006, did not quantify an  ejection fraction, but was read out as showing an LV function that  appeared to be relatively good.  Patient does have a history of  bradycardia.  He presents to the office today for followup for  complaints of near-syncope.  The patient went to the emergency room  recently, on March 13, with complaints of near-syncope and low blood  pressure and low heart rate.  He had an elevated white count of 19,000.  His hematocrit was normal at 47.  D-dimer was elevated at 1.47.  Potassium was 5.1, BUN was 37, creatinine 1.5.  Point of care markers  were negative times one and the BNP was normal at 35.  He was set up for  a chest CT and this revealed no evidence of pulmonary embolus.  There  was a 3 mm right lung nodule and followup chest CT was recommended by  the radiologist.  There was a porcelain gallbladder also noted.  The  radiologist noted that this has been associated with an increased risk  of gallbladder neoplasm.  The patient was treated with outpatient  antibiotics due to his high  white count and suspicion for pneumonia.  He  returns today in our office for followup.  He has not had any further  episodes of near-syncope.  He says that these episodes of near-syncope  date back probably to one year ago.  He has probably had about three  episodes or so.  He denies any chest pain, tightness or heaviness.  He  does have chronic dyspnea on exertion.  This was not changed recently.  Denies any orthopnea, paroxysmal nocturnal dyspnea or lower extremity  edema.   CURRENT MEDICATIONS:  1. Furosemide 40 mg a day.  2. Levothyroxine 0.137 mg daily.  3. Aspirin 80 mg daily.  4. Lovastatin 20 mg q.h.s.  5. Lisinopril 20 mg one and a half tablets daily.  6. Omeprazole 20 mg daily.  7. Potassium 20 mEq daily.  8. Gemfibrozil 600 mg daily.  9. Pravastatin 40 mg one and a half tablets daily.  10.Carvedilol 12.5 mg a half tablet b.i.d.  11.Nitroglycerin p.r.n.   ALLERGIES:  IV DYE.   SOCIAL HISTORY:  He is an ex-smoker.   REVIEW OF SYSTEMS:  Please see HPI.  He has had a recent flare of gout.  Denies any melena, hematochezia or dysuria.  Denies hematemesis or  hemoptysis.  Rest of review of systems was negative.   PHYSICAL EXAM:  He is a well-nourished, well-developed, male, in no  distress.  Blood pressure is 111/68 with pulse 53, lying; 122/66 with a  pulse of 54 sitting; 113/68 with a pulse of 58 standing.  After two  minutes, 124/68 with a pulse of 57; after five minutes, 126/75 with a  pulse of 61.  HEENT:  Unremarkable.  NECK:  Without JVD.  Carotids without bruits bilaterally.  COR:  Normal S1, S2.  Regular rate and rhythm without murmurs.  LUNGS:  Clear to auscultation bilaterally, without wheezing, rhonchi or  rales.  ABDOMEN:  Soft, nontender, with normal bowel sounds, no organomegaly.  EXTREMITIES:  With trace to 1+ edema bilaterally.  Calves are soft and  nontender.  SKIN:  Warm and dry.  NEUROLOGIC:  Alert and oriented times three.  Cranial nerves II  through  XII grossly intact.   Electrocardiogram reveals a normal heart rate of 53, left axis  deviation, a ventricular conduction delay.  No significant change from  his previous tracings.   IMPRESSION:  1. Near-syncope.  2. History of ischemic cardiomyopathy with an EF of about 25-35%.      a.     Recent echocardiogram in 2006 showed improved LV function.  3. Coronary artery disease, status post CABG in 1999.  Patent grafts,      last catheterization in September 2005, as outlined above.  4. Hypertension.  5. Hyperlipidemia.  6. Sinus bradycardia.  7. Treated hypothyroidism.  8. Obesity.  9. Abnormal chest CT with a 3 mm right-lung nodule.  10.Porcelain gallbladder, noted on chest CT.   PLAN:  The patient presents to the office today for evaluation of near-  syncope.  He has had several episodes of this.  These seem to be  associated with low blood pressures and low heart rates.  It has been  quite some time since he has been evaluated with a monitor.  We will set  him up for a CardioNet event monitor to rule out the possibility of  significant arrhythmias or significant pauses.  He has had lower heart  rates and we will go ahead and decrease his carvedilol to 3.125 mg twice  a day.  His BUN was somewhat elevated when he was seen in the ER.  We  will go ahead and decrease his furosemide to 20 mg a day and his  potassium to 10 mEq a day.  I discussed the patient's case further with  Dr. Corinda Gubler.  He also saw the patient.  We have recommended followup  echocardiogram in addition to the above.  We have also recommended  referring the patient to pulmonology for further evaluation of his  abnormal chest CT and gastroenterology to further evaluation of his  porcelain gallbladder, noted on chest CT.  We will have the patient  follow up with Dr. Juanda Chance in the next several weeks to review the above  testing and make further recommendations.      Tereso Newcomer, PA-C  Electronically  Signed      [name], MD, [facility]  Electronically Signed   SW/MedQ  DD: 01/05/2007  DT: 01/05/2007  Job #: [id number]  cc:   Samuel Jester

## 2011-02-20 NOTE — Cardiovascular Report (Signed)
NAME:  James [name], James [name]                      ACCOUNT NO.:  [id number]   MEDICAL RECORD NO.:  [id number]                   PATIENT TYPE:  INP   LOCATION:  3741                                 FACILITY:  [facility]   PHYSICIAN:  Rollene Rotunda, M.D. [facility]            DATE OF BIRTH:  Dec 08, 1934   DATE OF PROCEDURE:  12/04/2002  DATE OF DISCHARGE:                              CARDIAC CATHETERIZATION   DATE OF BIRTH:  Sep 20, 1935   PRIMARY CARE PHYSICIAN:  Samuel Jester, M.D.   PROCEDURE:  Left heart catheterization/coronary arteriography.   INDICATIONS:  Evaluate patient with chest pain, Cardiolite suggesting  inferoseptal apical ischemia and previous CABG.   DESCRIPTION OF PROCEDURE:  Left heart catheterization was performed via the  right femoral artery. The artery was cannulated using the anterior wall  puncture. A #6 French arterial sheath was inserted via the modified  Seldinger technique. Preformed Judkins and a pigtail catheter were utilized.  The patient tolerated the procedure well and left the lab in stable  condition.   HEMODYNAMICS:  LV 145/21, AO 154/90.   CORONARY ARTERIES:  1. The left main was normal.  2. The LAD was occluded in the mid segment after second septal perforator.     The distal vessel was diffusely diseased and was seen to fill via the     LIMA.  It was a small vessel.  3. The circumflex was occluded in the mid segment of the AV groove.  A     moderate sized mid obtuse marginal was occluded at the ostium with long     70-80% proximal stenosis. It was seen to fill via a vein graft.  4. The right coronary artery was occluded after an acute marginal branch.     The distal vessel was seen to fill via vein graft.  The vein graft was     sewn to a PDA and backfilled a large posterolateral.  The mid and distal     PDA was small and diffusely diseased.   GRAFTS:  1. LIMA to the LAD was patent, perfusing the small diffusely diseased mid     and apical  LAD.  2. A saphenous vein graft to the PDA was patent backfilling the large     posterolateral.  3. The saphenous vein graft to the diagonal was patent.  This was a small     vein graft and a small branch.  There was proximal 25% stenosis.  4. The saphenous vein graft to the mid obtuse marginal was patent.   LEFT VENTRICULOGRAM:  Left ventriculogram was obtained in the RAO  projection.  The EF was 35% with severe antero and/or apical akinesis.   CONCLUSIONS:  Severe three-vessel coronary artery disease.  Patent grafts.  Small vessel coronary artery disease.  Ischemic cardiomyopathy.    PLAN:  The patient will have medical management.  He is to have risk  reduction, which is to include  the need to stop smoking.  He reports that he  did stop smoking, however, his family physician seems to think otherwise.                                                   Rollene Rotunda, M.D. [facility]    JH/MEDQ  D:  12/04/2002  T:  12/04/2002  Job:  [id number]   cc:   [address]  [place]  [place] [address]  Fax: [phone]

## 2011-02-20 NOTE — Assessment & Plan Note (Signed)
[facility]                              CARDIOLOGY OFFICE NOTE   BRITTANY, AMIRAULT                       MRN:          [id number]  DATE:04/30/2006                            DOB:          [birth year]    PRIMARY CARE PHYSICIAN:  Samuel Jester, M.D., [facility].   CLINICAL HISTORY:  Mr. Portocarrero is 76 years old and had bypass surgery in  1999, has ischemic cardiomyopathy, and ejection fraction of 25% by last  catheterization.  He was catheterized in 2005, and at that time, all his  grafts were patent.  His last echocardiogram was performed in June 2006, and  at that time the left ventricle was poorly visualized.   He has been getting along reasonably well.  He has had no chest pain and no  palpitations, but he does get short of breath with minimal exertion.   PAST MEDICAL HISTORY:  1.  Hypertension.  2.  Hyperlipidemia.   CURRENT MEDICATIONS:  1.  Gemfibrozil.  2.  Lovastatin.  3.  Furosemide.  4.  Aspirin.  5.  Lisinopril.  6.  Carvedilol.  7.  Prilosec.  8.  K-Dur.   PHYSICAL EXAMINATION:  VITAL SIGNS:  Blood pressure is 135/60, pulse 51 and  regular.  NECK:  There was no venous distention.  Carotid pulses were full without  bruits.  CHEST:  Clear without rales or rhonchi.  HEART:  Regular rhythm.  No murmurs, no gallops.  ABDOMEN:  Soft, without organomegaly.  EXTREMITIES:  Peripheral pulses were full.  There is no peripheral edema.   LABORATORY DATA:  Electrocardiogram showed an intraventricular conduction  delay with a QRS duration of 122.  There was left axis deviation and poor R-  wave progression consistent with a possible old anterior infarction.   IMPRESSION:  1.  Coronary artery disease, status post coronary artery bypass grafting      surgery in 1999.  2.  Ischemic cardiomyopathy with an ejection fraction of 25% by      catheterization and 35% by echocardiogram.  3.  Hypertension.  4.  Hyperlipidemia.   RECOMMENDATIONS:  I think Mr. Ellsworth is doing reasonably well.  He is  getting lab work by Dr. Charm Barges and I will just ask her to send [place] a report  on his cholesterol and basic metabolic panel.  I will not change any of his  medications.  It will be difficult to titrate his carvedilol up because of  his slow heart rate.  I will plan to see him back in followup in one year.                               Bruce Elvera Lennox Juanda Chance, MD, [facility]    BRB/MedQ  DD:  04/30/2006  DT:  04/30/2006  Job #:  [id number]   cc:   Samuel Jester  Dr. Alfonse Ras

## 2011-02-20 NOTE — Consult Note (Signed)
NAME:  James [name], James [name]                      ACCOUNT NO.:  [id number]   MEDICAL RECORD NO.:  [id number]                   PATIENT TYPE:  EMS   LOCATION:  MAJO                                 FACILITY:  [facility]   PHYSICIAN:  Olney Bing, M.D. [facility]           DATE OF BIRTH:  1934-10-11   DATE OF CONSULTATION:  DATE OF DISCHARGE:                                   CONSULTATION   REFERRING PHYSICIAN:  Doug Sou, M.D.   PRIMARY CARE PHYSICIAN:  Dr. Charm Barges in [place].   PRIMARY CARDIOLOGIST:  Dr. Juanda Chance.   HISTORY OF PRESENT ILLNESS:  A 76 year old gentleman with coronary disease  and prior CABG surgery presents with weakness and diaphoresis.  Mr.  Waide cardiac history dates to 76 when he suffered an acute myocardial  infarction - records of that hospitalization are not immediately available.  He was apparently treated with primary percutaneous intervention.  Within  six months, he required CABG surgery, which was apparently an uncomplicated  procedure.  He has been seen intermittently by Dr. Juanda Chance in the office  since without any significant cardiac problems.  He is generally fairly  sedentary, but reports no chest pain nor dyspnea.  This morning, he was seen  by his primary care doctor due to redness in his eyes.  A diagnosis of  conjunctivitis was made and treated with ophthalmic levofloxacin.  He also  had some nasal congestion treated with a decongestant.  This evening, he  complained of an episode of profuse diaphoresis and weakness.  At the  present time, he has no specific complaints.  He has had no rigors.  His  temperature has remained normal in the emergency department over a period of  hours.   Initial laboratory demonstrated mildly elevated CPK with a negative MB  fraction and borderline troponin.  On second determinate, the CPK is higher,  but the troponin remains borderline and MB fraction remains negative.   PAST MEDICAL HISTORY:  Most notable for  hypothyroidism treated with  replacement.  He has had no surgery other than his cardiac surgery.  There  has been a question of depression.  He has had no hypertension nor  hyperlipidemia.  There has been no suggestion of diabetes.   SOCIAL HISTORY:  He lives with his wife; discontinued cigarette smoking at  the time of his myocardial infarction.  No excessive use of alcohol.   FAMILY HISTORY:  Positive for coronary disease.   REVIEW OF SYSTEMS:  All systems negative, except as noted above.   PHYSICAL EXAMINATION:  GENERAL:  A pleasant gentleman with somewhat flat  affect in no acute distress.   VITAL SIGNS:  The temperature is 96.5, blood pressure 130/70, heart rate 75  and regular, respirations 20.   HEENT:  Minimal scleral injection, no scleral icterus.   NECK:  No jugular venous distention, right carotid bruit present.   ENDOCRINE:  No thyromegaly.   SKIN:  No significant  lesions.   LUNGS:  Bibasilar rales.   CARDIAC:  Normal first and second heart sounds, fourth heart sound and  modest systolic ejection murmur present.   ABDOMEN:  Soft, nontender, no bruits, no organomegaly.   EXTREMITIES:  Distal pulses intact, 1/2+ ankle edema.   NEUROMUSCULAR:  Symmetric strength and tone.   Electrocardiogram shows normal sinus rhythm, left atrial abnormality, IVCD,  left axis deviation, prior anterolateral myocardial infarction.   Chest x-ray shows bibasilar atelectasis.   LABORATORY DATA:  Other laboratory including CBC and chemistry profile are  unremarkable.   IMPRESSION:  A 76 year old gentleman with known coronary disease, probable  significant left ventricular dysfunction due to a sizable myocardial  infarction in the past and nonspecific elevation of cardiac markers in the  setting of nonspecific symptoms.  The nature of his acute illness is  unclear.  I would recommend an observation stay in the hospital with  assessment of TSH, D-dimer, and serial cardiac markers.   An echocardiogram  will be obtained.  We will be happy to follow this gentleman while he is  admitted and thereafter.                                                Bing, M.D. [facility]    RR/MEDQ  D:  11/29/2002  T:  11/29/2002  Job:  [id number]

## 2011-02-20 NOTE — Assessment & Plan Note (Signed)
[facility]                         GASTROENTEROLOGY OFFICE NOTE   James [name], James [name]                       MRN:          [id number]  DATE:01/31/2007                            DOB:          31-Dec-1934    REFERRING PHYSICIAN:  Everardo Beals. Juanda Chance, MD, [facility]   REASON FOR REFERRAL:  Porcelain gallbladder.   HISTORY OF PRESENT ILLNESS:  James [name] is a 76 year old, white male  followed by Drs. Samuel Jester and Charlies Constable. He has a significant  cardiac history with coronary artery disease status post CABG in 1999.  He has an ischemic cardiomyopathy with a decreased ejection fraction. He  is currently wearing a cardiac event monitor. He was seen in the  emergency room at [facility] for hypotension, weakness and a  question of a pulmonary embolism. A CT angiogram dated December 16, 2006  showed no evidence for acute pulmonary embolism, a 3 mm right lung  nodule was noted, a porcelain gallbladder was noted and bibasilar  atelectasis and possible consolidation was noted. He has had chronic  GERD and his symptoms are well controlled on Omeprazole. He notes no  dysphagia, odynophagia, weight loss, change in bowel habits, melena,  hematochezia, or change in stool caliber. He states he underwent a  colonoscopy at the [facility] in 2006. On further  questioning, he is uncertain as to whether this was a sigmoidoscopy or  colonoscopy.   PAST MEDICAL HISTORY:  1. Coronary artery disease status post CABG in 1999.  2. Ischemic cardiomyopathy with decreased ejection fraction.  3. Hypertension.  4. Hyperlipidemia.  5. GERD.  6. Hypothyroidism.  7. Sleep apnea.  8. Osteoarthritis.   CURRENT MEDICATIONS:  Listed on the chart updated and reviewed.   MEDICATION ALLERGIES:  IV CONTRAST DYE.   SOCIAL HISTORY:  Per the handwritten form.   REVIEW OF SYSTEMS:  Per the handwritten form.   PHYSICAL EXAMINATION:  GENERAL:  Well-developed,  overweight, white male  in no acute distress.  VITAL SIGNS:  Height 5 feet 9 inches, weight 225.6 pounds, blood  pressure is 110/62, pulse 64 and regular.  HEENT:  Anicteric sclera, oropharynx clear.  CHEST:  Clear to auscultation bilaterally.  CARDIAC:  Regular rate and rhythm without murmurs appreciated.  ABDOMEN:  Soft, nontender, nondistended, normal active bowel sounds, no  palpable organomegaly, masses or hernias.  EXTREMITIES:  Without clubbing, cyanosis or edema.  NEUROLOGIC:  Alert and oriented x3. Grossly nonfocal.   ASSESSMENT/PLAN:  Porcelain gallbladder. This puts him at an increased  risk for gallbladder cancer. Due to this increased risk, elective  cholecystectomy is the standard management. Given his underlying heart  disease, he would like to discuss the cardiac risk with Dr. Juanda Chance prior  to surgical referral. The patient is unsure whether he wants to proceed  with surgery. If cholecystectomy is not performed, he will remain at an  increased risk of gallbladder cancer and both he and his wife  understand. The patient will contact me for a surgical referral if he  decides to pursue cholecystectomy or he can obtain a surgical referral  through  Dr. Charlies Constable after further discussions with him.     Venita Lick. Russella Dar, MD, [facility]  Electronically Signed    MTS/MedQ  DD: 01/31/2007  DT: 01/31/2007  Job #: [id number]   cc:   Everardo Beals. Juanda Chance, MD, [facility]

## 2011-02-20 NOTE — Cardiovascular Report (Signed)
NAME:  James [name] NO.:  [id number]   MEDICAL RECORD NO.:  [id number]          PATIENT TYPE:  OIB   LOCATION:  6501                         FACILITY:  [facility]   PHYSICIAN:  Charlies Constable, M.D. [facility] DATE OF BIRTH:  1935-06-21   DATE OF PROCEDURE:  06/26/2004  DATE OF DISCHARGE:                              CARDIAC CATHETERIZATION   CLINICAL HISTORY:  [name] is 76 years old and has had prior bypass  surgery and has an ischemic cardiomyopathy with an ejection fraction of 35%.  Recently, he was seen in the office with increasing symptoms of fatigue and  was evaluated with a Cardiolite scan with suggested inferior ischemia.  For  this reason, he was brought into the outpatient laboratory for evaluation  with angiography.   PROCEDURE:  Right heart catheterization was performed percutaneously via the  right femoral vein using a venous sheath and 6 French catheter.  Left heart  catheterization was performed percutaneously using an arterial sheath and 6  French pigtail and coronary catheters.  We used a multipurpose catheter for  injection of the vein graft to the right coronary artery and a left bypass  graft catheter for injection of the vein graft to the circumflex artery.  A  LIMA catheter was used for injection of the LIMA graft.  The patient  tolerated the procedure well and left the laboratory in satisfactory  condition.   RESULTS:  Left main coronary artery is free of significant disease.   Left anterior descending artery is completely occluded after a diagonal  branch and two septal perforators.   Circumflex artery gives rise to two marginal branches and then had a 90%  stenosis in its mid portion and was completely occluded distally.   The right coronary artery gave rise to a clonus branch, right ventricular  branch, and then was completely occluded in its mid portion.   The saphenous vein graft to the posterior descending branch of the right  coronary artery was patent and functioned normally and filled the posterior  descending artery and posterolateral branch.  The saphenous vein graft to  the posterolateral branch of the circumflex artery was patent and functioned  normally.   The saphenous vein graft to the diagonal branch of the LAD was patent.  There was 50% narrowing in its mid portion.   The LIMA graft to the LAD was patent and functioned normally.   The left ventriculogram from the RAO projection showed hypokinesis of the  inferior wall and akinesis of the apex and anterolateral wall.  Ejection  fraction 25%.   HEMODYNAMIC DATA:  Right ventricular pressure was 6, pulmonary artery  pressure was 25/13 with mean 19.  Pulmonary wedge pressure was 13 and rose  to 19 by the end of the procedure.  Left ventricular pressure was 130/18 and  the aortic pressure was 130/61 with a mean of 85.  Cardiac output/cardiac  index was 3.5/1.6 liters/minute/meter squared.  The PA saturation was 60%.  The arterial saturation was 94%.   CONCLUSION:  1.  Coronary artery disease status post prior coronary artery bypass graft  surgery.  2.  Severe native vessel disease with total occlusion of the circumflex,      right, and left anterior descending artery.  3.  Patent vein graft to the posterior descending branch of the right      coronary artery, patent vein graft to the posterolateral branch of the      circumflex artery, 50% narrowing in the vein graft to the diagonal      branch of the LAD, and patent LIMA graft to the LAD.  4.  Severe left ventricular dysfunction with inferior wall hypokinesis and      apical and anterior wall akinesis and estimated ejection fraction of      25%.   RECOMMENDATIONS:  There is no source of ischemia.  The patient has severe  ischemic cardiomyopathy and his LV function may be somewhat worse.  We will  try to optimize medical management for this.       BB/MEDQ  D:  06/26/2004  T:  06/27/2004   Job:  [id number]

## 2011-07-13 LAB — TSH: TSH: 0.462

## 2011-07-13 LAB — URINALYSIS, ROUTINE W REFLEX MICROSCOPIC
Nitrite: NEGATIVE
Specific Gravity, Urine: 1.017
pH: 5

## 2011-07-13 LAB — LIPID PANEL
Cholesterol: 140
HDL: 30 — ABNORMAL LOW
Triglycerides: 159 — ABNORMAL HIGH

## 2011-07-13 LAB — URINE CULTURE

## 2011-07-13 LAB — COMPREHENSIVE METABOLIC PANEL
ALT: 19
ALT: 20
AST: 28
CO2: 21
Calcium: 8.5
Calcium: 8.9
Chloride: 103
GFR calc Af Amer: >60
GFR calc non Af Amer: 52 — ABNORMAL LOW
Glucose, Bld: 129 — ABNORMAL HIGH
Potassium: 3.9
Sodium: 131 — ABNORMAL LOW
Sodium: 138
Total Bilirubin: 0.9
Total Protein: 6.3

## 2011-07-13 LAB — POCT I-STAT 3, VENOUS BLOOD GAS (G3P V)
Acid-base deficit: 5 — ABNORMAL HIGH
Bicarbonate: 19.7 — ABNORMAL LOW
O2 Saturation: 58
Operator id: 298401
pO2, Ven: 32

## 2011-07-13 LAB — URINE MICROSCOPIC-ADD ON

## 2011-07-13 LAB — CBC
Hemoglobin: 14.3
MCHC: 35
MCV: 87.9
Platelets: 258
RBC: 4.29
RDW: 13.1
RDW: 13.3
WBC: 10.3
WBC: 17.2 — ABNORMAL HIGH

## 2011-07-13 LAB — BASIC METABOLIC PANEL
BUN: 23
BUN: 27 — ABNORMAL HIGH
Calcium: 8.5
Creatinine, Ser: 0.99
GFR calc non Af Amer: >60
GFR calc non Af Amer: >60
Glucose, Bld: 109 — ABNORMAL HIGH
Glucose, Bld: 86

## 2011-07-13 LAB — POCT I-STAT 3, ART BLOOD GAS (G3+)
Bicarbonate: 19.3 — ABNORMAL LOW
TCO2: 20
pCO2 arterial: 32.7 — ABNORMAL LOW
pH, Arterial: 7.379

## 2011-07-13 LAB — CORTISOL: Cortisol, Plasma: 13

## 2012-04-18 ENCOUNTER — Telehealth: Payer: Self-pay | Admitting: Physician Assistant

## 2012-04-18 NOTE — Telephone Encounter (Signed)
Returned call to patient home number & patient was not aware that she called.  States that his sister must have called.  Has been on ASA 81mg , but told her that he did not feel right unless he took 2 tabs.  Sister [phone])  Ahmed Prima - states patient lives with her.   Has OV scheduled for 9/3 with GD.

## 2012-04-18 NOTE — Telephone Encounter (Signed)
Can patient take 325 mg of ASA Please call [phone]

## 2012-04-19 MED ORDER — ASPIRIN 81 MG PO TABS: 162.0000 mg | ORAL_TABLET | Freq: Every day | ORAL | Status: DC

## 2012-04-19 NOTE — Telephone Encounter (Signed)
Left message to return call 

## 2012-04-19 NOTE — Telephone Encounter (Signed)
Patient was on ASA 81 daily, when last seen in office, 3/12. He can certainly take ASA 162 daily, if he is more comfortable with this dose.

## 2012-04-19 NOTE — Telephone Encounter (Signed)
Sister (Ms. Phineas Real) notified of below.

## 2012-04-19 NOTE — Addendum Note (Signed)
Addended by: Lesle Chris on: 04/19/2012 11:59 AM   Modules accepted: Orders

## 2012-06-07 ENCOUNTER — Ambulatory Visit: Payer: Medicare Other | Admitting: Cardiology

## 2012-08-15 ENCOUNTER — Ambulatory Visit (INDEPENDENT_AMBULATORY_CARE_PROVIDER_SITE_OTHER): Payer: Medicare Other | Admitting: Cardiology

## 2012-08-15 ENCOUNTER — Encounter: Payer: Self-pay | Admitting: Cardiology

## 2012-08-15 VITALS — BP 136/74 | HR 64 | Ht 69.0 in | Wt 192.0 lb

## 2012-08-15 DIAGNOSIS — R001 Bradycardia, unspecified: Secondary | ICD-10-CM

## 2012-08-15 DIAGNOSIS — I498 Other specified cardiac arrhythmias: Secondary | ICD-10-CM

## 2012-08-15 DIAGNOSIS — I25810 Atherosclerosis of coronary artery bypass graft(s) without angina pectoris: Secondary | ICD-10-CM

## 2012-08-15 DIAGNOSIS — R55 Syncope and collapse: Secondary | ICD-10-CM

## 2012-08-15 DIAGNOSIS — I5022 Chronic systolic (congestive) heart failure: Secondary | ICD-10-CM

## 2012-08-15 DIAGNOSIS — E669 Obesity, unspecified: Secondary | ICD-10-CM

## 2012-08-15 DIAGNOSIS — I10 Essential (primary) hypertension: Secondary | ICD-10-CM

## 2012-08-15 NOTE — Patient Instructions (Addendum)
Your physician recommends that you schedule a follow-up appointment in: 6 months. You will receive a reminder letter in the mail in about 4 months reminding you to call and schedule your appointment. If you don't receive this letter, please contact our office.  Your physician recommends that you continue on your current medications as directed. Please refer to the Current Medication list given to you today.  Your physician has recommended that you wear a 24 holter monitor. Holter monitors are medical devices that record the heart's electrical activity. Doctors most often use these monitors to diagnose arrhythmias. Arrhythmias are problems with the speed or rhythm of the heartbeat. The monitor is a small, portable device. You can wear one while you do your normal daily activities. This is usually used to diagnose what is causing palpitations/syncope (passing out).

## 2012-08-15 NOTE — Progress Notes (Signed)
HPI  The patient is a 77 year old male with a history of coronary artery disease and heart failure. He has remote bypass surgery and stenting of the saphenous vein graft to the right coronary artery. In August of 2010 he developed recurrent heart failure and his ejection fraction was down to 20%. At that time he had 3 out of 4 grafts patent.  In December of 2011 again 3 out of 4 grafts are patent and his ejection fraction was 30% on cath. Pulmonary capillary wedge pressure was 9.   Allergies  Allergen Reactions  . Celecoxib   . Codeine   . Iohexol     Current Outpatient Prescriptions  Medication Sig Dispense Refill  . acetaminophen (TYLENOL) 650 MG CR tablet Take 650 mg by mouth every 8 (eight) hours as needed.        Marland Kitchen aspirin 81 MG tablet Take 2 tablets (162 mg total) by mouth daily.      . clopidogrel (PLAVIX) 75 MG tablet Take 75 mg by mouth daily.        . colchicine-probenecid 0.5-500 MG per tablet Take 1/2 by mouth twice daily       . furosemide (LASIX) 40 MG tablet Take 40 mg by mouth daily.        Marland Kitchen levothyroxine (SYNTHROID, LEVOTHROID) 112 MCG tablet Take 112 mcg by mouth daily.        Marland Kitchen lisinopril (PRINIVIL,ZESTRIL) 40 MG tablet Take 1/2 by mouth daily      . nitroGLYCERIN (NITROSTAT) 0.4 MG SL tablet Place 0.4 mg under the tongue every 5 (five) minutes as needed.          Past Medical History  Diagnosis Date  . Coronary artery disease 09/2007    s/p CABG and s/p placement of drug-eluting stent tothe saphenous vein graft to right coronary arter   . CHF (congestive heart failure)     which is felt to be compensated/Adx 06/2009  . GERD (gastroesophageal reflux disease)   . Hypertension   . Hyperlipidemia   . Thyroid disease   . Obesity   . Nodule of right lung   . Porcelain gallbladder     Past Surgical History  Procedure Date  . Cardiac catheterization 06/2009  . Coronary artery bypass graft 09/2007    ROS:  As stated in the HPI and negative for all other  systems.  PHYSICAL EXAM BP 136/74  Pulse 64  Ht 5\' 9"  (1.753 m)  Wt 192 lb (87.091 kg)  BMI 28.35 kg/m2 GENERAL:  Well appearing HEENT:  Pupils equal round and reactive, fundi not visualized, oral mucosa unremarkable NECK:  No jugular venous distention, waveform within normal limits, carotid upstroke brisk and symmetric, no bruits, no thyromegaly LYMPHATICS:  No cervical, inguinal adenopathy LUNGS:  Clear to auscultation bilaterally BACK:  No CVA tenderness CHEST:  Well healed sternotomy scar. HEART:  PMI not displaced or sustained,S1 and S2 within normal limits, no S3, no S4, no clicks, no rubs, no murmurs ABD:  Flat, positive bowel sounds normal in frequency in pitch, no bruits, no rebound, no guarding, no midline pulsatile mass, no hepatomegaly, no splenomegaly EXT:  2 plus pulses throughout, no edema, no cyanosis no clubbing SKIN:  No rashes no nodules NEURO:  Cranial nerves II through XII grossly intact, motor grossly intact throughout PSYCH:  Cognitively intact, oriented to person place and time  EKG:  Junctional rhythm with possible atrioventricular dissociation, interventricular conduction delay, left axis deviation, no acute ST-T wave changes. 08/15/2012  ASSESSMENT AND PLAN  CARDIOMYOPATHY, ISCHEMIC -  Patient has an ischemic cardiomyopathy. His ejection fraction is 30%. The patient has refused a defibrillator or CRT-D. We cannot titrate with spironolactone given hyperkalemia in the past. With the bradycardia arrhythmia he cannot tolerate a beta blocker. He will remain on the meds as listed.   BRADYCARDIA -  He appears to have a junctional rhythm though it could be some heart block or AV dissociation. I'm going to apply a 24-hour Holter monitor although it's unlikely he would let me suggest a pacemaker or CRT-D in the future.  CAD, AUTOLOGOUS BYPASS GRAFT -  The patient has no new sypmtoms.  No further cardiovascular testing is indicated.  We will continue with aggressive  risk reduction and meds as listed.  DYSLIPIDEMIA - He was on Prilosec previously and doesn't know why this was stopped. I will restart pravastatin 40 mg. It in a lipid profile in 8 weeks.

## 2012-08-17 ENCOUNTER — Other Ambulatory Visit: Payer: Self-pay | Admitting: *Deleted

## 2012-08-17 DIAGNOSIS — R001 Bradycardia, unspecified: Secondary | ICD-10-CM

## 2013-11-24 ENCOUNTER — Encounter (HOSPITAL_COMMUNITY): Payer: Self-pay | Admitting: *Deleted

## 2013-11-24 ENCOUNTER — Inpatient Hospital Stay (HOSPITAL_COMMUNITY)
Admission: EM | Admit: 2013-11-24 | Discharge: 2013-12-14 | DRG: 246 | Disposition: A | Discharge status: Skilled Nursing Facility | Payer: Medicare Other | Source: Other Acute Inpatient Hospital | Attending: Internal Medicine | Admitting: Internal Medicine

## 2013-11-24 DIAGNOSIS — I82409 Acute embolism and thrombosis of unspecified deep veins of unspecified lower extremity: Secondary | ICD-10-CM

## 2013-11-24 DIAGNOSIS — G9341 Metabolic encephalopathy: Secondary | ICD-10-CM | POA: Diagnosis present

## 2013-11-24 DIAGNOSIS — I129 Hypertensive chronic kidney disease with stage 1 through stage 4 chronic kidney disease, or unspecified chronic kidney disease: Secondary | ICD-10-CM | POA: Diagnosis present

## 2013-11-24 DIAGNOSIS — I824Z9 Acute embolism and thrombosis of unspecified deep veins of unspecified distal lower extremity: Secondary | ICD-10-CM | POA: Diagnosis not present

## 2013-11-24 DIAGNOSIS — I5043 Acute on chronic combined systolic (congestive) and diastolic (congestive) heart failure: Secondary | ICD-10-CM | POA: Diagnosis present

## 2013-11-24 DIAGNOSIS — E871 Hypo-osmolality and hyponatremia: Secondary | ICD-10-CM | POA: Diagnosis not present

## 2013-11-24 DIAGNOSIS — I2510 Atherosclerotic heart disease of native coronary artery without angina pectoris: Secondary | ICD-10-CM | POA: Diagnosis present

## 2013-11-24 DIAGNOSIS — I25810 Atherosclerosis of coronary artery bypass graft(s) without angina pectoris: Secondary | ICD-10-CM

## 2013-11-24 DIAGNOSIS — I959 Hypotension, unspecified: Secondary | ICD-10-CM

## 2013-11-24 DIAGNOSIS — I10 Essential (primary) hypertension: Secondary | ICD-10-CM | POA: Diagnosis present

## 2013-11-24 DIAGNOSIS — I5023 Acute on chronic systolic (congestive) heart failure: Secondary | ICD-10-CM

## 2013-11-24 DIAGNOSIS — E663 Overweight: Secondary | ICD-10-CM

## 2013-11-24 DIAGNOSIS — E039 Hypothyroidism, unspecified: Secondary | ICD-10-CM

## 2013-11-24 DIAGNOSIS — I2589 Other forms of chronic ischemic heart disease: Secondary | ICD-10-CM | POA: Diagnosis present

## 2013-11-24 DIAGNOSIS — J189 Pneumonia, unspecified organism: Secondary | ICD-10-CM

## 2013-11-24 DIAGNOSIS — N182 Chronic kidney disease, stage 2 (mild): Secondary | ICD-10-CM

## 2013-11-24 DIAGNOSIS — Z87891 Personal history of nicotine dependence: Secondary | ICD-10-CM

## 2013-11-24 DIAGNOSIS — R410 Disorientation, unspecified: Secondary | ICD-10-CM

## 2013-11-24 DIAGNOSIS — F29 Unspecified psychosis not due to a substance or known physiological condition: Secondary | ICD-10-CM | POA: Diagnosis present

## 2013-11-24 DIAGNOSIS — Z6828 Body mass index (BMI) 28.0-28.9, adult: Secondary | ICD-10-CM

## 2013-11-24 DIAGNOSIS — I509 Heart failure, unspecified: Secondary | ICD-10-CM

## 2013-11-24 DIAGNOSIS — Z7982 Long term (current) use of aspirin: Secondary | ICD-10-CM

## 2013-11-24 DIAGNOSIS — E785 Hyperlipidemia, unspecified: Secondary | ICD-10-CM

## 2013-11-24 DIAGNOSIS — Z9861 Coronary angioplasty status: Secondary | ICD-10-CM

## 2013-11-24 DIAGNOSIS — J9600 Acute respiratory failure, unspecified whether with hypoxia or hypercapnia: Secondary | ICD-10-CM | POA: Diagnosis not present

## 2013-11-24 DIAGNOSIS — J9601 Acute respiratory failure with hypoxia: Secondary | ICD-10-CM

## 2013-11-24 DIAGNOSIS — I5022 Chronic systolic (congestive) heart failure: Secondary | ICD-10-CM

## 2013-11-24 DIAGNOSIS — R32 Unspecified urinary incontinence: Secondary | ICD-10-CM | POA: Diagnosis not present

## 2013-11-24 DIAGNOSIS — R063 Periodic breathing: Secondary | ICD-10-CM | POA: Diagnosis not present

## 2013-11-24 DIAGNOSIS — J181 Lobar pneumonia, unspecified organism: Secondary | ICD-10-CM

## 2013-11-24 DIAGNOSIS — I214 Non-ST elevation (NSTEMI) myocardial infarction: Principal | ICD-10-CM

## 2013-11-24 DIAGNOSIS — K219 Gastro-esophageal reflux disease without esophagitis: Secondary | ICD-10-CM | POA: Diagnosis present

## 2013-11-24 MED ORDER — CARVEDILOL 3.125 MG PO TABS
3.1250 mg | ORAL_TABLET | Freq: Two times a day (BID) | ORAL | Status: DC
  Filled 2013-11-24: qty 1

## 2013-11-24 MED ORDER — ONDANSETRON HCL 4 MG/2ML IJ SOLN: 4.0000 mg | Freq: Four times a day (QID) | INTRAMUSCULAR | Status: DC | PRN

## 2013-11-24 MED ORDER — ACETAMINOPHEN 325 MG PO TABS
650.0000 mg | ORAL_TABLET | ORAL | Status: DC | PRN
  Filled 2013-11-24: qty 2

## 2013-11-24 MED ORDER — SODIUM CHLORIDE 0.9 % IV SOLN
250.0000 mL | INTRAVENOUS | Status: DC | PRN
Start: 2013-11-24 — End: 2013-11-28

## 2013-11-24 MED ORDER — POTASSIUM CHLORIDE CRYS ER 20 MEQ PO TBCR
20.0000 meq | EXTENDED_RELEASE_TABLET | Freq: Two times a day (BID) | ORAL | Status: DC
  Administered 2013-11-24: 20 meq via ORAL
  Filled 2013-11-24 (×3): qty 1

## 2013-11-24 MED ORDER — SODIUM CHLORIDE 0.9 % IJ SOLN: 3.0000 mL | INTRAMUSCULAR | Status: DC | PRN

## 2013-11-24 MED ORDER — NITROGLYCERIN 2 % TD OINT
0.5000 [in_us] | TOPICAL_OINTMENT | Freq: Four times a day (QID) | TRANSDERMAL | Status: DC
  Administered 2013-11-24: 0.5 [in_us] via TOPICAL
  Filled 2013-11-24: qty 30

## 2013-11-24 MED ORDER — SODIUM CHLORIDE 0.9 % IJ SOLN
3.0000 mL | Freq: Two times a day (BID) | INTRAMUSCULAR | Status: DC
  Administered 2013-11-24: 3 mL via INTRAVENOUS
  Administered 2013-11-25: 10:00:00 via INTRAVENOUS
  Administered 2013-11-26: 3 mL via INTRAVENOUS
  Administered 2013-11-27: 09:00:00 via INTRAVENOUS
  Administered 2013-11-28: 3 mL via INTRAVENOUS

## 2013-11-24 MED ORDER — HEPARIN (PORCINE) IN NACL 100-0.45 UNIT/ML-% IJ SOLN
1800.0000 [IU]/h | INTRAMUSCULAR | Status: DC
  Administered 2013-11-25: 1550 [IU]/h via INTRAVENOUS
  Administered 2013-11-26: 1650 [IU]/h via INTRAVENOUS
  Administered 2013-11-26 – 2013-11-29 (×5): 1800 [IU]/h via INTRAVENOUS
  Filled 2013-11-24 (×17): qty 250

## 2013-11-24 MED ORDER — ASPIRIN EC 325 MG PO TBEC
325.0000 mg | DELAYED_RELEASE_TABLET | Freq: Every day | ORAL | Status: DC
  Administered 2013-11-25 – 2013-11-29 (×5): 325 mg via ORAL
  Filled 2013-11-24 (×5): qty 1

## 2013-11-24 MED ORDER — FUROSEMIDE 10 MG/ML IJ SOLN
40.0000 mg | Freq: Two times a day (BID) | INTRAMUSCULAR | Status: DC
  Administered 2013-11-25: 40 mg via INTRAVENOUS
  Filled 2013-11-24 (×3): qty 4

## 2013-11-24 NOTE — Progress Notes (Addendum)
ANTICOAGULATION CONSULT NOTE - Initial Consult  Pharmacy Consult for Heparin  Indication: chest pain/ACS  Allergies  Allergen Reactions  . Celecoxib   . Codeine   . Iohexol     Patient Measurements: Height: 5\' 9"  (175.3 cm) Weight: 190 lb 4.1 oz (86.3 kg) IBW/kg (Calculated) : 70.7  Vital Signs: Temp: 97.6 F (36.4 C) (02/20 2243) Temp src: Oral (02/20 2243) BP: 124/71 mmHg (02/20 2315) Pulse Rate: 91 (02/20 2315)   Estimated Creatinine Clearance: 74.8 ml/min (by C-G formula based on Cr of 0.9).  Medical History: Past Medical History  Diagnosis Date  . Coronary artery disease 09/2007    s/p CABG and s/p placement of drug-eluting stent tothe saphenous vein graft to right coronary arter   . CHF (congestive heart failure)     which is felt to be compensated/Adx 06/2009  . GERD (gastroesophageal reflux disease)   . Hypertension   . Hyperlipidemia   . Thyroid disease   . Obesity   . Nodule of right lung   . Porcelain gallbladder     [facility]:   Heparin 4000 units BOLUS at ~2000 Heparin drip at 1000 units/hr (visually verified on pump)  Assessment: [name] tx from [facility] on heparin for CP.   [facility] 13.7/42.7 INR 1.3 Scr 1.01  Goal of Therapy:  Heparin level 0.3-0.7 units/ml Monitor platelets by anticoagulation protocol: Yes   Plan:  -Continue heparin at 1000 units/hr -Check heparin level with AM labs -Daily CBC/HL -Monitor for bleeding -F/U MD plans  Abran DukeLedford, Goldman Birchall 11/24/2013,11:30 PM  Addendum 7:32 AM First HL is undetectable -Will given heparin 3000 units BOLUS x 1 and increase heparin drip to 1250 units/hr -1400 HL JLedford, PharmD

## 2013-11-24 NOTE — Plan of Care (Deleted)
Problem: Phase I Progression Outcomes Goal: Anginal pain relieved Outcome: Completed/Met Date Met:  11/24/13 Given at [facility]

## 2013-11-24 NOTE — Consult Note (Addendum)
Cardiology Consultation Note  Patient ID: James [name], MRN: [id number], DOB/AGE: [birth year] 79 y.o. Admit date: 11/24/2013   Date of Consult: 11/24/2013 Primary Physician: Samuel JesterBUTLER, CYNTHIA, DO Primary Cardiologist: Dr Antoine PocheHochrein   Chief Complaint: NSTEMI  Reason for Consult: NSTEMI   HPI: 79 yr old male with hx of CAD s/p 4 vessel CABG, PCI of SVG to RCA ( per notes last cath in 2011)  , ischemic cmp ef 30 % , HTN  admitted for management of possible pneumonia and NSTEMI   HPI pt currently appears lethargic with mild SOB at rest. He is unclear as to why as he here. Per family at bedside he was lethargic and mild SOB . Had significant discomfort in his right knee and bilateral Lower ext swelling . Denies any overt chest discomfort , cough , fever . Denies any orthopnea, PND , syncope, bleeding etc.   [facility] had chest xray that revealed LUL infiltrate suggesting pneumonia and WBC of 11. Trop was 3.55. Right LE ext Duplex study was unremarkable for SVT and knee Xray showed severe OA   Pt [facility] by Dr Trego LionsHochrien and hospitalist.   Past Medical History  Diagnosis Date  . Coronary artery disease 09/2007    s/p CABG and s/p placement of drug-eluting stent tothe saphenous vein graft to right coronary arter   . CHF (congestive heart failure)     which is felt to be compensated/Adx 06/2009  . GERD (gastroesophageal reflux disease)   . Hypertension   . Hyperlipidemia   . Thyroid disease   . Obesity   . Nodule of right lung   . Porcelain gallbladder       Most Recent Cardiac Studies: Echo 2011 - Left ventricle: The cavity size was mildly dilated. Wall thickness was normal. Systolic function was mildly to moderately reduced. The estimated ejection fraction was in the range of 40% to 45%. Akinesis of the mid-distal lateral and apical myocardium. Doppler parameters are consistent with abnormal left ventricular relaxation (grade 1  diastolic dysfunction). - Mitral valve: Mild regurgitation. - Left atrium: The atrium was mildly dilated. - Atrial septum: No defect or patent foramen ovale was identified.  --------------------------------------------------------------------    Surgical History:  Past Surgical History  Procedure Laterality Date  . Cardiac catheterization  06/2009  . Coronary artery bypass graft  09/2007     Home Meds: Prior to Admission medications   Medication Sig Start Date End Date Taking? Authorizing Provider  acetaminophen (TYLENOL) 650 MG CR tablet Take 650 mg by mouth every 8 (eight) hours as needed.      Historical Provider, MD  aspirin 81 MG tablet Take 2 tablets (162 mg total) by mouth daily. 04/19/12   Rande BruntEugene C Serpe, PA-C  clopidogrel (PLAVIX) 75 MG tablet Take 75 mg by mouth daily.      Historical Provider, MD  colchicine-probenecid 0.5-500 MG per tablet Take 1/2 by mouth twice daily     Historical Provider, MD  furosemide (LASIX) 40 MG tablet Take 40 mg by mouth daily.      Historical Provider, MD  levothyroxine (SYNTHROID, LEVOTHROID) 112 MCG tablet Take 112 mcg by mouth daily.      Historical Provider, MD  lisinopril (PRINIVIL,ZESTRIL) 40 MG tablet Take 1/2 by mouth daily    Historical Provider, MD  nitroGLYCERIN (NITROSTAT) 0.4 MG SL tablet Place 0.4 mg under the tongue every 5 (five) minutes as needed.  Historical Provider, MD    Inpatient Medications:  . [START ON 11/25/2013] aspirin EC  325 mg Oral Daily  . [START ON 11/25/2013] carvedilol  3.125 mg Oral BID WC  . [START ON 11/25/2013] furosemide  40 mg Intravenous BID  . nitroGLYCERIN  0.5 inch Topical 4 times per day  . potassium chloride  20 mEq Oral BID  . sodium chloride  3 mL Intravenous Q12H   . heparin 1,000 Units/hr (11/24/13 2345)    Allergies:  Allergies  Allergen Reactions  . Celecoxib   . Codeine   . Iohexol     History   Social History  . Marital Status: Single    Spouse Name: N/A    Number of  Children: N/A  . Years of Education: N/A   Occupational History  . Not on file.   Social History Main Topics  . Smoking status: Former Smoker    Quit date: 06/05/1998  . Smokeless tobacco: Not on file  . Alcohol Use: No  . Drug Use: No  . Sexual Activity: Not Currently     Comment: lives with sister/no exercise   Other Topics Concern  . Not on file   Social History Narrative  . No narrative on file     Family History  Problem Relation Age of Onset  . Hyperlipidemia Sister      Review of Systems: General: negative for chills, fever, night sweats or weight changes.  Cardiovascular:  Per HPI  Respiratory: negative per HPI  Urologic: negative for hematuria Abdominal: negative for nausea, vomiting, diarrhea, bright red blood per rectum, melena, or hematemesis Neurologic: negative for visual changes, syncope, or dizziness All other systems reviewed and are otherwise negative except as noted above.  Labs: No results found for this basename: CKTOTAL, CKMB, TROPONINI,  in the last 72 hours Lab Results  Component Value Date   WBC 8.6 09/24/2010   HGB 13.9 09/24/2010   HCT 40.8 09/24/2010   MCV 89.5 09/24/2010   PLT 265.0 09/24/2010   No results found for this basename: NA, K, CL, CO2, BUN, CREATININE, CALCIUM, LABALBU, PROT, BILITOT, ALKPHOS, ALT, AST, GLUCOSE,  in the last 168 hours Lab Results  Component Value Date   CHOL  Value: 126        ATP III CLASSIFICATION:  <200     mg/dL   Desirable  161-096  mg/dL   Borderline High  >=045    mg/dL   High        01/11/8118   HDL 36* 05/30/2009   LDLCALC  Value: 70        Total Cholesterol/HDL:CHD Risk Coronary Heart Disease Risk Table                     Men   Women  1/2 Average Risk   3.4   3.3  Average Risk       5.0   4.4  2 X Average Risk   9.6   7.1  3 X Average Risk  23.4   11.0        Use the calculated Patient Ratio above and the CHD Risk Table to determine the patient's CHD Risk.        ATP III CLASSIFICATION (LDL):  <100      mg/dL   Optimal  147-829  mg/dL   Near or Above                    Optimal  130-159  mg/dL   Borderline  696-295  mg/dL   High  >284     mg/dL   Very High 1/32/4401   TRIG 98 05/30/2009   No results found for this basename: DDIMER    Radiology/Studies:  No results found.  EKG:Dec 02, 2013 17:36 at [facility], [facility]   Physical Exam: Blood pressure 115/74, pulse 88, temperature 97.6 F (36.4 C), temperature source Oral, resp. rate 23, height 5\' 9"  (1.753 m), weight 86.3 kg (190 lb 4.1 oz), SpO2 98.00%. General: Well developed, well nourished, in no acute distress. Head: Normocephalic, atraumatic, sclera non-icteric, no xanthomas, nares are without discharge.  Neck: Negative for carotid bruits. JVD not elevated. Lungs: decreased BS bibasilar . Heart: RRR with S1 S2. 2/6 systolic mumur at the base  Abdomen: Soft, non-tender, non-distended with normoactive bowel sounds. No hepatomegaly. No rebound/guarding. No obvious abdominal masses. Msk:  Strength and tone appear normal for age. Extremities: radial pulse 1+ , warm ext , right LE 3+ pitting edema upto knee , left ext 2+ upto mid shins  Neuro: Alert and oriented X 3. No facial asymmetry. No focal deficit. Moves all extremities spontaneously. Psych:  Responds to questions appropriately with a normal affect.     Assessment and Plan:  NSTE- ACS , hx of CAD  CHF - decompensated, acute on chronic  Possible Pneumonia   Plan  -Cont aspirin  , plavix and heparin gtt  -Trend CE and monitor on tele  -per family B- blocker in the past made his HR too slow and hypotensive . He has refused ICD/ PPM in the past and he is also hypervolemic. Therefore would hold on coreg that was started in the hospital.   - cont ACE and lasix 40 IV bid  - d/c nitro paste and start Nitro gtt  - unclear why pt is not on statin and will start lipitor.  - possible pneumonia workup and management per primary team   Signed, Deborah Chalk, A M.D  December 02, 2013, 11:48  PM

## 2013-11-24 NOTE — Plan of Care (Signed)
Problem: Phase I Progression Outcomes Goal: Aspirin unless contraindicated Outcome: Completed/Met Date Met:  11/24/13 ASA given at [facility]

## 2013-11-25 ENCOUNTER — Inpatient Hospital Stay (HOSPITAL_COMMUNITY): Payer: Medicare Other

## 2013-11-25 ENCOUNTER — Encounter (HOSPITAL_COMMUNITY): Payer: Self-pay

## 2013-11-25 DIAGNOSIS — I214 Non-ST elevation (NSTEMI) myocardial infarction: Secondary | ICD-10-CM

## 2013-11-25 DIAGNOSIS — I5023 Acute on chronic systolic (congestive) heart failure: Secondary | ICD-10-CM

## 2013-11-25 DIAGNOSIS — J189 Pneumonia, unspecified organism: Secondary | ICD-10-CM

## 2013-11-25 DIAGNOSIS — E039 Hypothyroidism, unspecified: Secondary | ICD-10-CM

## 2013-11-25 LAB — CBC WITH DIFFERENTIAL/PLATELET
BASOS ABS: 0 10*3/uL (ref 0.0–0.1)
Basophils Relative: 0 % (ref 0–1)
Eosinophils Absolute: 0 10*3/uL (ref 0.0–0.7)
Eosinophils Relative: 0 % (ref 0–5)
HCT: 38.2 % — ABNORMAL LOW (ref 39.0–52.0)
Hemoglobin: 12.6 g/dL — ABNORMAL LOW (ref 13.0–17.0)
LYMPHS ABS: 0.9 10*3/uL (ref 0.7–4.0)
LYMPHS PCT: 9 % — AB (ref 12–46)
MCH: 29.9 pg (ref 26.0–34.0)
MCHC: 33 g/dL (ref 30.0–36.0)
MCV: 90.5 fL (ref 78.0–100.0)
Monocytes Absolute: 0.9 10*3/uL (ref 0.1–1.0)
Monocytes Relative: 9 % (ref 3–12)
NEUTROS ABS: 8.3 10*3/uL — AB (ref 1.7–7.7)
Neutrophils Relative %: 82 % — ABNORMAL HIGH (ref 43–77)
PLATELETS: 260 10*3/uL (ref 150–400)
RBC: 4.22 MIL/uL (ref 4.22–5.81)
RDW: 13.8 % (ref 11.5–15.5)
WBC: 10.2 10*3/uL (ref 4.0–10.5)

## 2013-11-25 LAB — PROCALCITONIN: Procalcitonin: <0.1 ng/mL

## 2013-11-25 LAB — COMPREHENSIVE METABOLIC PANEL
ALK PHOS: 81 U/L (ref 39–117)
ALT: 12 U/L (ref 0–53)
AST: 19 U/L (ref 0–37)
Albumin: 3.1 g/dL — ABNORMAL LOW (ref 3.5–5.2)
BILIRUBIN TOTAL: 0.9 mg/dL (ref 0.3–1.2)
BUN: 33 mg/dL — ABNORMAL HIGH (ref 6–23)
CHLORIDE: 107 meq/L (ref 96–112)
CO2: 23 meq/L (ref 19–32)
Calcium: 9.6 mg/dL (ref 8.4–10.5)
Creatinine, Ser: 1.05 mg/dL (ref 0.50–1.35)
GFR calc Af Amer: 77 mL/min — ABNORMAL LOW (ref >90)
GFR, EST NON AFRICAN AMERICAN: 66 mL/min — AB (ref >90)
Glucose, Bld: 120 mg/dL — ABNORMAL HIGH (ref 70–99)
Potassium: 4.2 mEq/L (ref 3.7–5.3)
SODIUM: 146 meq/L (ref 137–147)
Total Protein: 6.4 g/dL (ref 6.0–8.3)

## 2013-11-25 LAB — BASIC METABOLIC PANEL
BUN: 34 mg/dL — ABNORMAL HIGH (ref 6–23)
CALCIUM: 9.6 mg/dL (ref 8.4–10.5)
CO2: 22 mEq/L (ref 19–32)
Chloride: 110 mEq/L (ref 96–112)
Creatinine, Ser: 1.04 mg/dL (ref 0.50–1.35)
GFR calc Af Amer: 78 mL/min — ABNORMAL LOW (ref >90)
GFR calc non Af Amer: 67 mL/min — ABNORMAL LOW (ref >90)
GLUCOSE: 112 mg/dL — AB (ref 70–99)
POTASSIUM: 4.3 meq/L (ref 3.7–5.3)
Sodium: 146 mEq/L (ref 137–147)

## 2013-11-25 LAB — MRSA PCR SCREENING: MRSA BY PCR: NEGATIVE

## 2013-11-25 LAB — HEPARIN LEVEL (UNFRACTIONATED)
Heparin Unfractionated: <0.1 IU/mL — ABNORMAL LOW (ref 0.30–0.70)
Heparin Unfractionated: 0.17 IU/mL — ABNORMAL LOW (ref 0.30–0.70)

## 2013-11-25 LAB — TSH: TSH: 2.12 u[IU]/mL (ref 0.350–4.500)

## 2013-11-25 LAB — TROPONIN I
TROPONIN I: 1.49 ng/mL — AB (ref <0.30)
Troponin I: 2.39 ng/mL (ref <0.30)
Troponin I: 1.85 ng/mL (ref <0.30)

## 2013-11-25 MED ORDER — ATORVASTATIN CALCIUM 40 MG PO TABS
40.0000 mg | ORAL_TABLET | Freq: Every day | ORAL | Status: DC
  Administered 2013-11-25 – 2013-12-13 (×17): 40 mg via ORAL
  Filled 2013-11-25 (×22): qty 1

## 2013-11-25 MED ORDER — NITROGLYCERIN IN D5W 200-5 MCG/ML-% IV SOLN
INTRAVENOUS | Status: AC
  Filled 2013-11-25: qty 250

## 2013-11-25 MED ORDER — SODIUM CHLORIDE 0.9 % IV SOLN
INTRAVENOUS | Status: AC
  Administered 2013-11-25: 20:00:00 via INTRAVENOUS

## 2013-11-25 MED ORDER — CLOPIDOGREL BISULFATE 75 MG PO TABS
75.0000 mg | ORAL_TABLET | Freq: Every day | ORAL | Status: DC
  Administered 2013-11-26 – 2013-12-14 (×18): 75 mg via ORAL
  Filled 2013-11-25 (×22): qty 1

## 2013-11-25 MED ORDER — FUROSEMIDE 10 MG/ML IJ SOLN
40.0000 mg | Freq: Every day | INTRAMUSCULAR | Status: DC
  Filled 2013-11-25: qty 4

## 2013-11-25 MED ORDER — LISINOPRIL 2.5 MG PO TABS
2.5000 mg | ORAL_TABLET | Freq: Every day | ORAL | Status: DC
  Administered 2013-11-25: 2.5 mg via ORAL
  Filled 2013-11-25: qty 1

## 2013-11-25 MED ORDER — SODIUM CHLORIDE 0.9 % IV BOLUS (SEPSIS)
250.0000 mL | Freq: Once | INTRAVENOUS | Status: AC
Start: 2013-11-25 — End: 2013-11-25
  Administered 2013-11-25: 250 mL via INTRAVENOUS

## 2013-11-25 MED ORDER — HEPARIN BOLUS VIA INFUSION
3000.0000 [IU] | Freq: Once | INTRAVENOUS | Status: AC
  Administered 2013-11-25: 3000 [IU] via INTRAVENOUS
  Filled 2013-11-25: qty 3000

## 2013-11-25 MED ORDER — HEPARIN BOLUS VIA INFUSION
2600.0000 [IU] | Freq: Once | INTRAVENOUS | Status: AC
  Administered 2013-11-25: 2600 [IU] via INTRAVENOUS
  Filled 2013-11-25: qty 2600

## 2013-11-25 MED ORDER — SODIUM CHLORIDE 0.9 % IV SOLN
INTRAVENOUS | Status: DC
  Administered 2013-11-25: 15:00:00 via INTRAVENOUS

## 2013-11-25 MED ORDER — NITROGLYCERIN IN D5W 200-5 MCG/ML-% IV SOLN
2.0000 ug/min | INTRAVENOUS | Status: DC
  Administered 2013-11-25: 5 ug/min via INTRAVENOUS

## 2013-11-25 MED ORDER — PERFLUTREN LIPID MICROSPHERE
INTRAVENOUS | Status: AC
  Administered 2013-11-25: 8 mL
  Filled 2013-11-25: qty 10

## 2013-11-25 MED ORDER — ALBUMIN HUMAN 25 % IV SOLN
25.0000 g | Freq: Once | INTRAVENOUS | Status: AC
  Administered 2013-11-25: 25 g via INTRAVENOUS
  Filled 2013-11-25: qty 100

## 2013-11-25 MED ORDER — CARVEDILOL 3.125 MG PO TABS
3.1250 mg | ORAL_TABLET | Freq: Two times a day (BID) | ORAL | Status: DC
  Filled 2013-11-25 (×2): qty 1

## 2013-11-25 MED ORDER — SODIUM CHLORIDE 0.9 % IV BOLUS (SEPSIS)
250.0000 mL | Freq: Once | INTRAVENOUS | Status: AC
  Administered 2013-11-25: 250 mL via INTRAVENOUS

## 2013-11-25 NOTE — Progress Notes (Signed)
Spoke with Dr. Rito EhrlichKrishnan at bedside. Order given to maintain SBP>80 without NS at 7550ml/hr if possible. NS stopped at this time as SBP>80. Will continue to monitor patient carefully and notify MS as needed.

## 2013-11-25 NOTE — H&P (Signed)
[facility] History and Physical  James CommentRobert R Slagter WUJ:[id number] DOB: 31-Jan-1935 DOA: 11/24/2013  Referring physician:  EDP PCP: Samuel JesterBUTLER, CYNTHIA, DO  Specialists:   Chief Complaint:   Swelling of Legs  HPI: James [name] is a 79 y.o. male with a history of CAD, and ischemic Cardiomyopathy and Chronic systolic CHF who was seen [facility] due to complaints of SOB, and worsening swelling of his lower legs  R>L over the past 2 days.   He denied having any Chest pain or fever or chills.   He was evaluated in the ED and was found to have a +Troponin level of 3.5, and changes consisted with CHF and possible pneumonia on chest X-ray per the EDP Dr. Dorthey Sawyerave.  Cardiology was contacted and patient [facility] Cardiac Evaluation and consultation.      Review of Systems:  Constitutional: No Weight Loss, No Weight Gain, Night Sweats, Fevers, Chills, Fatigue, or Generalized Weakness HEENT: No Headaches, Difficulty Swallowing,Tooth/Dental Problems,Sore Throat,  No Sneezing, Rhinitis, Ear Ache, Nasal Congestion, or Post Nasal Drip,  Cardio-vascular:  No Chest pain, +Orthopnea, PND, +Edema in lower extremities, Anasarca, Dizziness, Palpitations  Resp: +Dyspnea, +DOE, No Productive Cough, No Non-Productive Cough, No Hemoptysis, No Change in Color of Mucus,  No Wheezing.    GI: No Heartburn, Indigestion, Abdominal Pain, Nausea, Vomiting, Diarrhea, Change in Bowel Habits,  Loss of Appetite  GU: No Dysuria, Change in Color of Urine, No Urgency or Frequency.  No flank pain.  Musculoskeletal: No Joint Pain or Swelling.  No Decreased Range of Motion. No Back Pain.  Neurologic: No Syncope, No Seizures, Muscle Weakness, Paresthesia, Vision Disturbance or Loss, No Diplopia, No Vertigo, No Difficulty Walking,  Skin: No Rash or Lesions. Psych: No Change in Mood or Affect. No Depression or Anxiety. No Memory loss. No Confusion or Hallucinations   Past Medical History   Diagnosis Date  . Coronary artery disease 09/2007    s/p CABG and s/p placement of drug-eluting stent tothe saphenous vein graft to right coronary arter   . CHF (congestive heart failure)     which is felt to be compensated/Adx 06/2009  . GERD (gastroesophageal reflux disease)   . Hypertension   . Hyperlipidemia   . Thyroid disease   . Obesity   . Nodule of right lung   . Porcelain gallbladder       Past Surgical History  Procedure Laterality Date  . Cardiac catheterization  06/2009  . Coronary artery bypass graft  09/2007       Prior to Admission medications   Medication Sig Start Date End Date Taking? Authorizing Provider  acetaminophen (TYLENOL) 650 MG CR tablet Take 650 mg by mouth every 8 (eight) hours as needed.      Historical Provider, MD  aspirin 81 MG tablet Take 2 tablets (162 mg total) by mouth daily. 04/19/12   Rande BruntEugene C Serpe, PA-C  clopidogrel (PLAVIX) 75 MG tablet Take 75 mg by mouth daily.      Historical Provider, MD  colchicine-probenecid 0.5-500 MG per tablet Take 1/2 by mouth twice daily     Historical Provider, MD  furosemide (LASIX) 40 MG tablet Take 40 mg by mouth daily.      Historical Provider, MD  levothyroxine (SYNTHROID, LEVOTHROID) 112 MCG tablet Take 112 mcg by mouth daily.      Historical Provider, MD  lisinopril (PRINIVIL,ZESTRIL) 40 MG tablet Take 1/2 by mouth daily    Historical Provider, MD  nitroGLYCERIN (NITROSTAT) 0.4 MG SL tablet Place 0.4 mg under the tongue every 5 (five) minutes as needed.      Historical Provider, MD      Allergies  Allergen Reactions  . Celecoxib   . Codeine   . Iohexol      Social History:  reports that he quit smoking about 15 years ago. He does not have any smokeless tobacco history on file. He reports that he does not drink alcohol or use illicit drugs.     Family History  Problem Relation Age of Onset  . Hyperlipidemia Sister        Physical Exam:  GEN:  Pleasant Obese Elderly  79 y.o.  Caucasian male  examined  and in no acute distress; cooperative with exam Filed Vitals:   11/24/13 2243 11/24/13 2300 11/24/13 2315 11/24/13 2330  BP:  112/64 124/71 115/74  Pulse:  74 91 88  Temp: 97.6 F (36.4 C)     TempSrc: Oral     Resp:  14 31 23   Height: 5\' 9"  (1.753 m)     Weight: 86.3 kg (190 lb 4.1 oz)     SpO2:  100% 100% 98%   Blood pressure 115/74, pulse 88, temperature 97.6 F (36.4 C), temperature source Oral, resp. rate 23, height 5\' 9"  (1.753 m), weight 86.3 kg (190 lb 4.1 oz), SpO2 98.00%. PSYCH: He is alert and oriented x 4; does not appear anxious does not appear depressed; affect is normal HEENT: Normocephalic and Atraumatic, Mucous membranes pink; PERRLA; EOM intact; Fundi:  Benign;  No scleral icterus, Nares: Patent, Oropharynx: Clear, Fair Dentition, Neck:  FROM, no cervical lymphadenopathy nor thyromegaly or carotid bruit; no JVD; Breasts:: Not examined CHEST WALL: No tenderness CHEST: Normal respiration, clear to auscultation bilaterally HEART: Regular rate and rhythm; no murmurs rubs or gallops BACK: No kyphosis or scoliosis; no CVA tenderness ABDOMEN: Positive Bowel Sounds,  Obese, soft non-tender; no masses, no organomegaly, no pannus; no intertriginous candida. Rectal Exam: Not done EXTREMITIES: No cyanosis, clubbing or +EDEMA R( 3+) >LLE (2+) ; no ulcerations. Genitalia: not examined PULSES: 2+ and symmetric SKIN: Normal hydration no rash or ulceration CNS:   Mental Status:  Vascular: pulses palpable throughout    Labs on Admission:  Basic Metabolic Panel:  Recent Labs Lab 11/24/13 2300  NA 146  K 4.2  CL 107  CO2 23  GLUCOSE 120*  BUN 33*  CREATININE 1.05  CALCIUM 9.6   Liver Function Tests:  Recent Labs Lab 11/24/13 2300  AST 19  ALT 12  ALKPHOS 81  BILITOT 0.9  PROT 6.4  ALBUMIN 3.1*   No results found for this basename: LIPASE, AMYLASE,  in the last 168 hours No results found for this basename: AMMONIA,  in the last 168  hours CBC:  Recent Labs Lab 11/24/13 2300  WBC 10.2  NEUTROABS 8.3*  HGB 12.6*  HCT 38.2*  MCV 90.5  PLT 260   Cardiac Enzymes: No results found for this basename: CKTOTAL, CKMB, CKMBINDEX, TROPONINI,  in the last 168 hours  BNP (last 3 results) No results found for this basename: PROBNP,  in the last 8760 hours CBG: No results found for this basename: GLUCAP,  in the last 168 hours  Radiological Exams on Admission: No results found.    EKG: Independently reviewed. Normal Sinus Rhythm , No acute S-T changes seen.      Assessment/Plan:   79 y.o. male with  Active Problems:   NSTEMI (non-ST elevated  myocardial infarction)   Acute on chronic systolic heart failure   HYPERLIPIDEMIA   Essential hypertension, benign   CAD, AUTOLOGOUS BYPASS GRAFT   CARDIOMYOPATHY, ISCHEMIC   Unspecified hypothyroidism      1.  NSTEMI/CAD-   Admitted to Stepdown Bed, Cards Consulted, Patient placed on IV NTG, O2 and ASA Rx and IV Heparin Drip.     2.   Acute on Chronic Systolic CHF-   CHF Protocol, ordered,  Diurese with IV lasix.    3.   HTN- Monitor BPs, continue  Lasix and Lisinopril Rx.     4.   Hyperlipidemia-  On No Cholesterol meds currently , check fasting lipids in AM.    5.   Hypothyroidism-   Continue Levothyroxine, and check TSH in AM.       Code Status: FULL CODE  Family Communication:  No Family Present Disposition Plan:  Inpatient  Time spent:   60 MInutes  Ron Parker [facility] Pager [phone]  If 7PM-7AM, please contact night-coverage [email or web address] Password [email or web address] 11/25/2013, 1:01 AM

## 2013-11-25 NOTE — Progress Notes (Signed)
Followup note: Patient admitted earlier this morning the step down unit. Seen this afternoon in followup.  Remains somewhat confused. Denies any shortness of breath or chest pain.  Acute on chronic systolic heart failure/ischemic cardiomyopathy: Received diuresis. Lasix plus beta blocker plus ACE inhibitor on hold from hypotension  Hypotension: Secondary to diuresis and poor contractility. Discussed with cardiology. Have started gentle fluid bolus. If this problem persists and trouble keeping patient's blood pressure above 80, We'll start low-dose dopamine  Pneumonia?: Check pro calcitonin level  Acute non-ST elevated MI. Continue aspirin plus statin plus heparin. Add Plavix. Patient might not not able to tolerate Coreg. Eventual ischemia evaluation.  Metabolic encephalopathy: Likely secondary to above issues. Monitor. Continue him step down.

## 2013-11-25 NOTE — Progress Notes (Signed)
Patient SBP 58/26 in right arm. Confirmed in left arm. Patient asymptomatic. MD paged. RN restarted NS at 4750ml/hr per MD order. Will monitor patient closely at bedside.

## 2013-11-25 NOTE — Progress Notes (Signed)
Patient given 250ml NS bolus and continuous fluids at 6450ml/hr per Md order. SPB now 83. Will continue to monitor patient closely.

## 2013-11-25 NOTE — Consult Note (Signed)
PULMONARY  / CRITICAL CARE MEDICINE CONSULTATION  Name: James [name] MRN: [id number] DOB: 09-Sep-1935    ADMISSION DATE:  11/24/2013  CONSULTING PHYSICIAN: Virginia Rochester, MD  CHIEF COMPLAINT:  Hypotension  BRIEF PATIENT DESCRIPTION: 79 yo male with h/o CAD, ischemic cardiomyopathy and chronic systolic CHF admitted for NSTEMI and acute on chronic CHF. We are now seeing patient in consultation at the request of Dr. Rito Ehrlich for the evaluation and management of hypotension.  SIGNIFICANT EVENTS / STUDIES:  1. CXR 11/25/2013 - LUL airspace disease, cardiomegaly, prior CABG 2.   Echocardiogram 11/24/2013 - results pending  LINES / TUBES: None  CULTURES: None  ANTIBIOTICS: None  HISTORY OF PRESENT ILLNESS:   79 yo male with CAD, CHF admitted with 2 day history of SOB and worsening BLE edema. He was found to have NSTEMI and is also being treated for acute on chronic CHF exacerbation with diuresis. This evening he was found to have BP as low as 55/32 but was otherwise asymptomatic. He denied any recent fever/chills, chest pain or SOB. We are now consulted for evaluation and management of hypotension.  PAST MEDICAL HISTORY :  Past Medical History  Diagnosis Date  . Coronary artery disease 09/2007    s/p CABG and s/p placement of drug-eluting stent tothe saphenous vein graft to right coronary arter   . CHF (congestive heart failure)     which is felt to be compensated/Adx 06/2009  . GERD (gastroesophageal reflux disease)   . Hypertension   . Hyperlipidemia   . Thyroid disease   . Obesity   . Nodule of right lung   . Porcelain gallbladder     Past Surgical History  Procedure Laterality Date  . Cardiac catheterization  06/2009  . Coronary artery bypass graft  09/2007    Prior to Admission medications   Medication Sig Start Date End Date Taking? Authorizing Provider  aspirin 81 MG tablet Take 2 tablets (162 mg total) by mouth daily. 04/19/12  Yes Rande Brunt, PA-C  clopidogrel  (PLAVIX) 75 MG tablet Take 75 mg by mouth daily.     Yes Historical Provider, MD  colchicine-probenecid 0.5-500 MG per tablet Take 1/2 by mouth twice daily    Yes Historical Provider, MD  furosemide (LASIX) 40 MG tablet Take 40 mg by mouth daily.     Yes Historical Provider, MD  levothyroxine (SYNTHROID, LEVOTHROID) 112 MCG tablet Take 112 mcg by mouth daily.     Yes Historical Provider, MD  levothyroxine (SYNTHROID, LEVOTHROID) 150 MCG tablet Take 150 mcg by mouth daily before breakfast.   Yes Historical Provider, MD  lisinopril (PRINIVIL,ZESTRIL) 40 MG tablet Take 1/2 by mouth daily   Yes Historical Provider, MD  nitroGLYCERIN (NITROSTAT) 0.4 MG SL tablet Place 0.4 mg under the tongue every 5 (five) minutes as needed.     Yes Historical Provider, MD  predniSONE (DELTASONE) 20 MG tablet  11/24/13   Historical Provider, MD  traMADol Janean Sark) 50 MG tablet  11/24/13   Historical Provider, MD    Allergies  Allergen Reactions  . Celecoxib     Unknown   . Codeine     Unknown   . Iohexol     Unknown     FAMILY HISTORY:  Family History  Problem Relation Age of Onset  . Hyperlipidemia Sister     SOCIAL HISTORY:  reports that he quit smoking about 15 years ago. He does not have any smokeless tobacco history on file. He reports that he does  not drink alcohol or use illicit drugs.  REVIEW OF SYSTEMS:  12 systems reviewed and negative except as per HPI.  PHYSICAL EXAM  VITAL SIGNS: Temp:  [97.6 F (36.4 C)-98.6 F (37 C)] 98.6 F (37 C) (02/21 1945) Pulse Rate:  [26-102] 80 (02/21 2031) Resp:  [14-33] 22 (02/21 2031) BP: (55-144)/(26-74) 84/64 mmHg (02/21 2031) SpO2:  [73 %-100 %] 98 % (02/21 2031) Weight:  [190 lb 4.1 oz (86.3 kg)-191 lb 2.2 oz (86.7 kg)] 191 lb 2.2 oz (86.7 kg) (02/21 0500)   INTAKE / OUTPUT: Intake/Output     02/21 0701 - 02/22 0700   P.O. 120   I.V. (mL/kg) 359.5 (4.1)   Total Intake(mL/kg) 479.5 (5.5)   Urine (mL/kg/hr) 1700 (1.3)   Total Output 1700    Net -1220.5         PHYSICAL EXAMINATION: General:  Appearing stated age, no acute distress Neuro:  Awake, alert, mildly confused but pleasant and cooperative HEENT:  AT, [place], EOMI, nasal cannula in place Neck:  Supple, +JVD to angle of jaw at 45 degrees Cardiovascular:  RRR, occasional PVC, no murmurs/rubs/gallops, 1+BLE R>L Lungs:  Clear to auscultation bilaterally Abdomen:  +BS, soft, nontender, nondistended Musculoskeletal:  No clubbing or cyanosis Skin:  No rash  LABS:  CBC Recent Labs     11/24/13  2300  WBC  10.2  HGB  12.6*  HCT  38.2*  PLT  260    Coag's No results found for this basename: APTT, INR,  in the last 72 hours  BMET Recent Labs     11/24/13  2300  11/25/13  0530  NA  146  146  K  4.2  4.3  CL  107  110  CO2  23  22  BUN  33*  34*  CREATININE  1.05  1.04  GLUCOSE  120*  112*    Electrolytes Recent Labs     11/24/13  2300  11/25/13  0530  CALCIUM  9.6  9.6    Sepsis Markers Recent Labs     11/25/13  1848  PROCALCITON  <0.10    ABG No results found for this basename: PHART, PCO2ART, PO2ART,  in the last 72 hours  Liver Enzymes Recent Labs     11/24/13  2300  AST  19  ALT  12  ALKPHOS  81  BILITOT  0.9  ALBUMIN  3.1*    Cardiac Enzymes Recent Labs     11/24/13  2246  11/25/13  0530  11/25/13  1019  TROPONINI  2.39*  1.85*  1.49*    Glucose No results found for this basename: GLUCAP,  in the last 72 hours  Imaging Dg Chest Port 1 View  11/25/2013   CLINICAL DATA:  Congestive heart failure. Hypotension. Coronary artery disease.  EXAM: PORTABLE CHEST - 1 VIEW  COMPARISON:  11/24/13  FINDINGS: Left upper lobe airspace disease shows no significant interval change. Cardiomegaly and pulmonary vascular congestion are stable. No evidence of pleural effusion. Prior CABG again noted.  IMPRESSION: No significant change in left upper lobe airspace disease, suspicious for pneumonia.   Electronically Signed   By: Myles Rosenthal M.D.    On: 11/25/2013 20:14   CXR: personally reviewed, notable for LUL infiltrates although appears improved compared to 11/24/2013   ASSESSMENT / PLAN: Active Problems:   HYPERLIPIDEMIA   Essential hypertension, benign   CAD, AUTOLOGOUS BYPASS GRAFT   CARDIOMYOPATHY, ISCHEMIC   NSTEMI (non-ST elevated myocardial infarction)  Acute on chronic systolic heart failure   Unspecified hypothyroidism    1. Hypotension: Patient with acute on chronic CHF and started on diuresis as well as ACE inhibitor on admission. At this time his hypotension is most likely related to diuresis, as he has improved with some gentle fluid repletion.  S/p 500 cc IVF and 25 mg albumin with improvement in SBP 90s.  Continue to monitor closely  No signs of infection noted and CXR appears somewhat improved from prior   I have personally obtained a history, examined the patient, evaluated laboratory and imaging results, formulated the assessment and plan and placed orders.  Germain Osgoodornelia Rosealyn Little, MD Pulmonary and Critical Care Medicine [facility] Pager: [phone]  11/25/2013, 9:42 PM

## 2013-11-25 NOTE — Progress Notes (Signed)
Spoke with Dr. Lovell SheehanJenkins. New orders received. Patient getting 250ml bolus NS.

## 2013-11-25 NOTE — Progress Notes (Addendum)
RN noted patients SBP in the 60s. Patient warm, dry, mentation from this am unchanged. Patient denies complaints. BP right arm 69/26. Re checked in left arm 64/29. HR 75 NSR with frequent PVCs noted. RR25 with 02 sats 96% on 2 liters nasal cannula. Dr. Rito EhrlichKrishnan notified. It is noted that patient received 2.5mg  lisinopril this am and 40mg  lasix. Dr. Rito EhrlichKrishnan stated he would consult with Cardiology be intouch ASAP. No new orders received at this time. Will continue to monitor patient closely at bedside and notify MD as needed.

## 2013-11-25 NOTE — Progress Notes (Signed)
2CRITICAL VALUE ALERT  Critical value received:  Troponin 2.39  Date of notification:  11/25/13  Time of notification:  0110  Critical value read back:yes  Nurse who received alert:  Madalyn RobStowe, Chonita Gadea Dawne   MD notified (1st page):  Dr. Don BroachYousuf  Time of first page:  0111  MD notified (2nd page):  Time of second page:  Responding MD:  Dr. Don BroachYousuf  Time MD responded:  0111

## 2013-11-25 NOTE — Progress Notes (Signed)
ANTICOAGULATION CONSULT NOTE - Follow Up Consult  Pharmacy Consult for Heparin  Indication: chest pain/ACS  Allergies  Allergen Reactions  . Celecoxib   . Codeine   . Iohexol     Patient Measurements: Height: 5\' 9"  (175.3 cm) Weight: 191 lb 2.2 oz (86.7 kg) IBW/kg (Calculated) : 70.7  Vital Signs: Temp: 98 F (36.7 C) (02/21 1100) Temp src: Oral (02/21 1100) BP: 81/49 mmHg (02/21 1300) Pulse Rate: 63 (02/21 1300)   Estimated Creatinine Clearance: 64.9 ml/min (by C-G formula based on Cr of 1.04).  Medical History: Past Medical History  Diagnosis Date  . Coronary artery disease 09/2007    s/p CABG and s/p placement of drug-eluting stent tothe saphenous vein graft to right coronary arter   . CHF (congestive heart failure)     which is Jermiyah Ricotta to be compensated/Adx 06/2009  . GERD (gastroesophageal reflux disease)   . Hypertension   . Hyperlipidemia   . Thyroid disease   . Obesity   . Nodule of right lung   . Porcelain gallbladder    Assessment: [name] tx from [facility] on heparin for NSTEMI. Pharmacy is consulted to manage heparin.   HL this afternoon remains subtherapeutic at 0.17.  Hgb 12.6, platelets 260. No bleeding noted.   Goal of Therapy:  Heparin level 0.3-0.7 units/ml Monitor platelets by anticoagulation protocol: Yes   Plan:  -Give heparin 2600 unit/hr bolus (~30 units/kg) -Increase heparin rate to 1,550 units/hr (~18 units/kg/hr) -Check 8 hour HL (2230) -Daily HL, CBC  Chima Astorino C. Wenda Vanschaick, PharmD Clinical Pharmacist-Resident Pager: [phone] Pharmacy: [phone] 11/25/2013 2:36 PM

## 2013-11-25 NOTE — Progress Notes (Signed)
eLink Physician-Brief Progress Note Patient Name: James [name] CommentRobert R Cregg DOB: [birth year] MRN: [id number]  Date of Service  11/25/2013   HPI/Events of Note   New consult.  Admitted yesterday with NSTEMI and CHF.  Patient diuresed and hypotensive which prompted our consultation.  No resp difficulty today.  Suspect volume depletion  eICU Interventions  Full consult to follow Fluid bolus Close follow-up   Intervention Category Intermediate Interventions: Hypotension - evaluation and management  Henry RusselSMITH, Holden Maniscalco, P 11/25/2013, 7:48 PM

## 2013-11-25 NOTE — Progress Notes (Signed)
INITIAL NUTRITION ASSESSMENT  DOCUMENTATION CODES Per approved criteria  -Not Applicable   INTERVENTION: No nutrition intervention at this time --- patient declined RD to follow for nutrition care plan  NUTRITION DIAGNOSIS: Inadequate oral intake related to decreased appetite as evidenced by patient report  Goal: Pt to meet >/= 90% of their estimated nutrition needs   Monitor:  PO intake, weight, labs, I/O's  Reason for Assessment: Malnutrition Screening Tool Report  79 y.o. male  Admitting Dx: swelling of legs  ASSESSMENT: Patient with PMH of CAD, ischemic cardiomyopathy and chronic systolic CHF who was [facility] due to complaints of SOB, and worsening swelling of his lower legs. He denied having any chest pain or fever or chills. He was evaluated in the ED and was found to have a +Troponin level of 3.5, and changes consisted with CHF and possible pneumonia on chest X-ray.  Patient reports his appetite is decreased; ordering his lunch during RD visit; state his intake has been limited for ~ 2 days; no unintentional weight loss reported; declined addition of nutrition supplements at this time.  Nutrition focused physical exam completed.  No muscle or subcutaneous fat depletion noticed.  Height: Ht Readings from Last 1 Encounters:  11/24/13 5\' 9"  (1.753 m)    Weight: Wt Readings from Last 1 Encounters:  11/25/13 191 lb 2.2 oz (86.7 kg)    Ideal Body Weight: 160 lb  % Ideal Body Weight: 119%  Wt Readings from Last 10 Encounters:  11/25/13 191 lb 2.2 oz (86.7 kg)  08/15/12 192 lb (87.091 kg)  12/23/10 203 lb (92.08 kg)  10/28/10 204 lb 4 oz (92.647 kg)  09/30/10 201 lb (91.173 kg)  09/18/10 196 lb (88.905 kg)  09/08/10 200 lb (90.719 kg)  05/08/10 196 lb (88.905 kg)  01/20/10 203 lb (92.08 kg)  09/09/09 203 lb (92.08 kg)    Usual Body Weight: 192 lb  % Usual Body Weight: 99%  BMI:  Body mass index is 28.21 kg/(m^2).  Estimated Nutritional  Needs: Kcal: 1950-2150 Protein: 95-105 gm Fluid: per MD  Skin: Intact  Diet Order: Carb Control  EDUCATION NEEDS: -No education needs identified at this time   Intake/Output Summary (Last 24 hours) at 11/25/13 1127 Last data filed at 11/25/13 1100  Gross per 24 hour  Intake 206.36 ml  Output   1200 ml  Net -993.64 ml    Labs:   Recent Labs Lab 11/24/13 2300 11/25/13 0530  NA 146 146  K 4.2 4.3  CL 107 110  CO2 23 22  BUN 33* 34*  CREATININE 1.05 1.04  CALCIUM 9.6 9.6  GLUCOSE 120* 112*    Scheduled Meds: . aspirin EC  325 mg Oral Daily  . atorvastatin  40 mg Oral q1800  . carvedilol  3.125 mg Oral BID WC  . [START ON 11/26/2013] clopidogrel  75 mg Oral Q breakfast  . [START ON 11/26/2013] furosemide  40 mg Intravenous Daily  . lisinopril  2.5 mg Oral Daily  . sodium chloride  3 mL Intravenous Q12H    Continuous Infusions: . heparin 1,250 Units/hr (11/25/13 0745)    Past Medical History  Diagnosis Date  . Coronary artery disease 09/2007    s/p CABG and s/p placement of drug-eluting stent tothe saphenous vein graft to right coronary arter   . CHF (congestive heart failure)     which is felt to be compensated/Adx 06/2009  . GERD (gastroesophageal reflux disease)   . Hypertension   .  Hyperlipidemia   . Thyroid disease   . Obesity   . Nodule of right lung   . Porcelain gallbladder     Past Surgical History  Procedure Laterality Date  . Cardiac catheterization  06/2009  . Coronary artery bypass graft  09/2007    Maureen Chatters, RD, LDN Pager #: [phone] After-Hours Pager #: [phone]

## 2013-11-25 NOTE — Progress Notes (Signed)
Echocardiogram 2D Echocardiogram has been performed.  James BasemanReel, James [name] 11/25/2013, 8:47 AM

## 2013-11-25 NOTE — Progress Notes (Signed)
    Subjective:  Mildly confused and lethargic; Denies CP or dyspnea   Objective:  Filed Vitals:   11/25/13 0614 11/25/13 0700 11/25/13 0741 11/25/13 0800  BP: 144/69 114/69 110/68 114/65  Pulse: 94 92 82 94  Temp:   97.8 F (36.6 C)   TempSrc:   Oral   Resp: 27 30 19 29   Height:      Weight:      SpO2: 96% 98% 97% 99%    Intake/Output from previous day:  Intake/Output Summary (Last 24 hours) at 11/25/13 0850 Last data filed at 11/25/13 0800  Gross per 24 hour  Intake 159.86 ml  Output      0 ml  Net 159.86 ml    Physical Exam: Physical exam: Well-developed well-nourished in no acute distress.  Skin is warm and dry.  HEENT is normal.  Neck is supple.  Chest is clear to auscultation with normal expansion.  Cardiovascular exam is regular rate and rhythm.  Abdominal exam nontender or distended. No masses palpated. Extremities show 1+ edema (R>L). neuro grossly intact    Lab Results: Basic Metabolic Panel:  Recent Labs  16/07/9601/20/15 2300 11/25/13 0530  NA 146 146  K 4.2 4.3  CL 107 110  CO2 23 22  GLUCOSE 120* 112*  BUN 33* 34*  CREATININE 1.05 1.04  CALCIUM 9.6 9.6   CBC:  Recent Labs  11/24/13 2300  WBC 10.2  NEUTROABS 8.3*  HGB 12.6*  HCT 38.2*  MCV 90.5  PLT 260   Cardiac Enzymes:  Recent Labs  11/24/13 2246 11/25/13 0530  TROPONINI 2.39* 1.85*     Assessment/Plan:  1 non-ST elevation myocardial infarction-the patient's troponin is positive. He is not having active chest pain. Continue aspirin and statin as well as heparin. Add low-dose carvedilol to see if he tolerates. Add plavix. He will ultimately need an ischemia evaluation once he improves. 2 acute on chronic systolic congestive heart failure-the patient is mildly volume overloaded. Blood pressure borderline. Discontinue nitroglycerin. Change Lasix to 40 mg IV daily. Follow renal function. 3 coronary artery disease-continue aspirin and statin. 4 question pneumonia-management per  primary care. 5 ischemic cardiomyopathy-add low-dose Coreg. Add low-dose lisinopril.  Olga MillersBrian Samyia Motter 11/25/2013, 8:50 AM

## 2013-11-26 ENCOUNTER — Inpatient Hospital Stay (HOSPITAL_COMMUNITY): Payer: Medicare Other

## 2013-11-26 DIAGNOSIS — J189 Pneumonia, unspecified organism: Secondary | ICD-10-CM

## 2013-11-26 DIAGNOSIS — I214 Non-ST elevation (NSTEMI) myocardial infarction: Principal | ICD-10-CM

## 2013-11-26 DIAGNOSIS — J181 Lobar pneumonia, unspecified organism: Secondary | ICD-10-CM

## 2013-11-26 DIAGNOSIS — R410 Disorientation, unspecified: Secondary | ICD-10-CM | POA: Diagnosis present

## 2013-11-26 DIAGNOSIS — I25810 Atherosclerosis of coronary artery bypass graft(s) without angina pectoris: Secondary | ICD-10-CM

## 2013-11-26 DIAGNOSIS — I959 Hypotension, unspecified: Secondary | ICD-10-CM | POA: Diagnosis present

## 2013-11-26 LAB — BASIC METABOLIC PANEL
BUN: 41 mg/dL — ABNORMAL HIGH (ref 6–23)
CALCIUM: 8.7 mg/dL (ref 8.4–10.5)
CO2: 22 mEq/L (ref 19–32)
CREATININE: 1.07 mg/dL (ref 0.50–1.35)
Chloride: 109 mEq/L (ref 96–112)
GFR calc Af Amer: 75 mL/min — ABNORMAL LOW (ref >90)
GFR calc non Af Amer: 65 mL/min — ABNORMAL LOW (ref >90)
GLUCOSE: 109 mg/dL — AB (ref 70–99)
Potassium: 3.6 mEq/L — ABNORMAL LOW (ref 3.7–5.3)
Sodium: 144 mEq/L (ref 137–147)

## 2013-11-26 LAB — CBC
HCT: 34.4 % — ABNORMAL LOW (ref 39.0–52.0)
Hemoglobin: 11.1 g/dL — ABNORMAL LOW (ref 13.0–17.0)
MCH: 29.4 pg (ref 26.0–34.0)
MCHC: 32.3 g/dL (ref 30.0–36.0)
MCV: 91.2 fL (ref 78.0–100.0)
Platelets: 206 10*3/uL (ref 150–400)
RBC: 3.77 MIL/uL — ABNORMAL LOW (ref 4.22–5.81)
RDW: 13.8 % (ref 11.5–15.5)
WBC: 10.2 10*3/uL (ref 4.0–10.5)

## 2013-11-26 LAB — HEPARIN LEVEL (UNFRACTIONATED)
Heparin Unfractionated: 0.29 IU/mL — ABNORMAL LOW (ref 0.30–0.70)
Heparin Unfractionated: 0.25 IU/mL — ABNORMAL LOW (ref 0.30–0.70)
Heparin Unfractionated: 0.51 IU/mL (ref 0.30–0.70)
Heparin Unfractionated: 0.5 IU/mL (ref 0.30–0.70)

## 2013-11-26 MED ORDER — LEVOFLOXACIN 500 MG PO TABS
500.0000 mg | ORAL_TABLET | Freq: Every day | ORAL | Status: DC
  Administered 2013-11-26 – 2013-11-29 (×4): 500 mg via ORAL
  Filled 2013-11-26 (×4): qty 1

## 2013-11-26 MED ORDER — FUROSEMIDE 10 MG/ML IJ SOLN
20.0000 mg | Freq: Every day | INTRAMUSCULAR | Status: DC
  Administered 2013-11-26 – 2013-11-29 (×4): 20 mg via INTRAVENOUS
  Filled 2013-11-26 (×4): qty 2

## 2013-11-26 NOTE — Progress Notes (Signed)
PULMONARY  / CRITICAL CARE MEDICINE    Name: James CommentRobert [name] MRN: [id number] DOB: 16-Oct-1934    ADMISSION DATE:  11/24/2013  CONSULTING PHYSICIAN: Virginia RochesterSendil Krishnan, MD  CHIEF COMPLAINT:  Hypotension  BRIEF PATIENT DESCRIPTION: 79 y/o male with h/o CAD, ischemic cardiomyopathy and chronic systolic CHF admitted for NSTEMI and acute on chronic CHF. We are now seeing patient in consultation at the request of Dr. Rito EhrlichKrishnan for the evaluation and management of hypotension.  SIGNIFICANT EVENTS / STUDIES:  2/20 - ECHO>> 2/21 - PCCM called for consult for hypotension   CULTURES:  None  ANTIBIOTICS:  None  SUBJECTIVE: Hypotension resolved.  Up to chair, no distress.   VITAL SIGNS: Temp:  [97.9 F (36.6 C)-98.6 F (37 C)] 98.2 F (36.8 C) (02/22 0730) Pulse Rate:  [26-102] 88 (02/22 0800) Resp:  [14-33] 28 (02/22 0800) BP: (55-127)/(26-75) 106/57 mmHg (02/22 0800) SpO2:  [73 %-100 %] 96 % (02/22 0800) Weight:  [194 lb 14.2 oz (88.4 kg)] 194 lb 14.2 oz (88.4 kg) (02/22 0357)  INTAKE / OUTPUT: Intake/Output     02/21 0701 - 02/22 0700 02/22 0701 - 02/23 0700   P.O. 120    I.V. (mL/kg) 1090.3 (12.3) 68 (0.8)   Total Intake(mL/kg) 1210.3 (13.7) 68 (0.8)   Urine (mL/kg/hr) 1700 (0.8) 250 (1.9)   Total Output 1700 250   Net -489.7 -182        Urine Occurrence 1 x     PHYSICAL EXAMINATION: General:  Appearing stated age, no acute distress Neuro:  Awake, alert, mildly confused but pleasant and cooperative HEENT:  AT, [place], EOMI, nasal cannula in place Neck:  Supple, +JVD to angle of jaw at 45 degrees Cardiovascular:  RRR, occasional PVC, no murmurs/rubs/gallops, 1+BLE R>L Lungs:  Even/non-labored, few posterior lower crackles Abdomen:  +BS, soft, nontender, nondistended Musculoskeletal:  No clubbing or cyanosis Skin:  No rash  LABS:  CBC Recent Labs     11/24/13  2300  11/26/13  0317  WBC  10.2  10.2  HGB   12.6*  11.1*  HCT  38.2*  34.4*  PLT  260  206   BMET Recent Labs     11/24/13  2300  11/25/13  0530  11/26/13  0317  NA  146  146  144  K  4.2  4.3  3.6*  CL  107  110  109  CO2  23  22  22   BUN  33*  34*  41*  CREATININE  1.05  1.04  1.07  GLUCOSE  120*  112*  109*   Electrolytes Recent Labs     11/24/13  2300  11/25/13  0530  11/26/13  0317  CALCIUM  9.6  9.6  8.7   Sepsis Markers Recent Labs     11/25/13  1848  PROCALCITON  <0.10   Liver Enzymes Recent Labs     11/24/13  2300  AST  19  ALT  12  ALKPHOS  81  BILITOT  0.9  ALBUMIN  3.1*   Cardiac Enzymes Recent Labs     11/24/13  2246  11/25/13  0530  11/25/13  1019  TROPONINI  2.39*  1.85*  1.49*   Glucose No results found for this basename: GLUCAP,  in the last 72 hours  Imaging Dg Chest Port 1 View  11/25/2013   CLINICAL DATA:  Congestive heart failure. Hypotension. Coronary artery disease.  EXAM: PORTABLE CHEST - 1 VIEW  COMPARISON:  11/24/13  FINDINGS: Left  upper lobe airspace disease shows no significant interval change. Cardiomegaly and pulmonary vascular congestion are stable. No evidence of pleural effusion. Prior CABG again noted.  IMPRESSION: No significant change in left upper lobe airspace disease, suspicious for pneumonia.   Electronically Signed   By: Myles Rosenthal M.D.   On: 11/25/2013 20:14    ASSESSMENT / PLAN: Active Problems:   HYPERLIPIDEMIA   Essential hypertension, benign   CAD, AUTOLOGOUS BYPASS GRAFT   CARDIOMYOPATHY, ISCHEMIC   NSTEMI (non-ST elevated myocardial infarction)   Acute on chronic systolic heart failure   Unspecified hypothyroidism    Hypotension - Patient with acute on chronic CHF and started on diuresis as well as ACE inhibitor on admission. At this time his hypotension is most likely related to diuresis, as he has improved with fluid repletion. Troponin Leak - Peak 2.39 on admit  Plan:. -hypotension resolved with IVF, normal mental status -No signs of  infection noted and CXR appears increased int markings, caution further aggressive bolus -monitor hemodynamics -await ECHO (per primary) -would follow pcxr further -cortisol if drops BP again (tsh wnl) -role abx?, consider pct algorithm to limit abx duration  PCCM will be available PRN.  Please call if new needs arise.   Canary Brim, NP-C [facility] Pgr: [phone] or [phone]   I have fully examined this patient and agree with above findings.    And edited in full  Mcarthur Rossetti. Tyson Alias, MD, FACP Pgr: [phone] [facility]   11/26/2013, 8:30 AM

## 2013-11-26 NOTE — Progress Notes (Signed)
[facility] PROGRESS NOTE  James [name] ZOX:[id number] DOB: [birth year] DOA: 11/24/2013 PCP: Samuel JesterBUTLER, CYNTHIA, DO  Assessment/Plan: Active Problems:   HYPERLIPIDEMIA: Continue statin.    Essential hypertension, benign: Limiting   CAD, AUTOLOGOUS BYPASS GRAFT: Continue aspirin.    CARDIOMYOPATHY, ISCHEMIC   NSTEMI (non-ST elevated myocardial infarction): Be monitored and managed conservatively. Troponins trending down. On heparin. Plavix added. Will need eventual ischemia evaluation.    Acute on chronic systolic heart failure: Difficult to chart urine output because of incontinence. Patient required fluid boluses yesterday, pressure better today. It is of note that recently patient had to have his medications adjusted because of significant hypotension at home.    Unspecified hypothyroidism: Stable. Continue Synthroid. TSH normal.   left upper lobe pneumonia: Calcitonin level normal, however repeat film does confirm left upper lobe pneumonia. Afebrile. We'll start Levaquin by mouth  Altered mental status: Improved. Reportedly, from family, this is an acute issue. Patient much more interactive today as diuresed and pressure stabilized. Suspect hypoxia and hypotension as cause.  Code Status: Full code  Family Communication: Will call patient's sister  Disposition Plan: Monitor patient throughout the day and if blood pressure stays steady, transfer to floor   Consultants:  [facility]  Procedures:  None  Antibiotics:  Levaquin 2/22-present  HPI/Subjective: Patient sitting upright having breakfast this morning. Feeling better. Denies any shortness of breath or chest pain. No complaints  Objective: Filed Vitals:   11/26/13 0926  BP: 92/44  Pulse:   Temp:   Resp: 32    Intake/Output Summary (Last 24 hours) at 11/26/13 1058 Last data filed at 11/26/13 0900  Gross per 24 hour  Intake 1518.99 ml  Output    800 ml  Net 718.99 ml   Filed Weights   11/24/13  2243 11/25/13 0500 11/26/13 0357  Weight: 86.3 kg (190 lb 4.1 oz) 86.7 kg (191 lb 2.2 oz) 88.4 kg (194 lb 14.2 oz)    Exam:   General:  Alert and oriented x2, no acute distress, more interactive and alert today  Cardiovascular: Regular rate and rhythm, S1-S2, 3/6 systolic ejection murmur  Respiratory: Decreased breath sounds throughout, more so at the bases  Abdomen: Soft, nontender, nondistended, positive bowel sounds  Musculoskeletal: No clubbing or cyanosis, trace pitting edema bilaterally   Data Reviewed: Basic Metabolic Panel:  Recent Labs Lab 11/24/13 2300 11/25/13 0530 11/26/13 0317  NA 146 146 144  K 4.2 4.3 3.6*  CL 107 110 109  CO2 23 22 22   GLUCOSE 120* 112* 109*  BUN 33* 34* 41*  CREATININE 1.05 1.04 1.07  CALCIUM 9.6 9.6 8.7   Liver Function Tests:  Recent Labs Lab 11/24/13 2300  AST 19  ALT 12  ALKPHOS 81  BILITOT 0.9  PROT 6.4  ALBUMIN 3.1*   CBC:  Recent Labs Lab 11/24/13 2300 11/26/13 0317  WBC 10.2 10.2  NEUTROABS 8.3*  --   HGB 12.6* 11.1*  HCT 38.2* 34.4*  MCV 90.5 91.2  PLT 260 206   Cardiac Enzymes:  Recent Labs Lab 11/24/13 2246 11/25/13 0530 11/25/13 1019  TROPONINI 2.39* 1.85* 1.49*   BNP (last 3 results) No results found for this basename: PROBNP,  in the last 8760 hours CBG: No results found for this basename: GLUCAP,  in the last 168 hours  Recent Results (from the past 240 hour(s))  MRSA PCR SCREENING     Status: None   Collection Time    11/24/13 10:43 PM  Result Value Ref Range Status   MRSA by PCR NEGATIVE  NEGATIVE Final   Comment:            The GeneXpert MRSA Assay (FDA     approved for NASAL specimens     only), is one component of a     comprehensive MRSA colonization     surveillance program. It is not     intended to diagnose MRSA     infection nor to guide or     monitor treatment for     MRSA infections.     Studies: Dg Chest Port 1 View  11/25/2013   CLINICAL DATA:  Congestive  heart failure. Hypotension. Coronary artery disease.  EXAM: PORTABLE CHEST - 1 VIEW  COMPARISON:  11/24/13  FINDINGS: Left upper lobe airspace disease shows no significant interval change. Cardiomegaly and pulmonary vascular congestion are stable. No evidence of pleural effusion. Prior CABG again noted.  IMPRESSION: No significant change in left upper lobe airspace disease, suspicious for pneumonia.   Electronically Signed   By: Myles Rosenthal M.D.   On: 11/25/2013 20:14    Scheduled Meds: . aspirin EC  325 mg Oral Daily  . atorvastatin  40 mg Oral q1800  . clopidogrel  75 mg Oral Q breakfast  . furosemide  20 mg Intravenous Daily  . sodium chloride  3 mL Intravenous Q12H   Continuous Infusions: . heparin 1,800 Units/hr (11/26/13 0600)    Active Problems:   HYPERLIPIDEMIA   Essential hypertension, benign   CAD, AUTOLOGOUS BYPASS GRAFT   CARDIOMYOPATHY, ISCHEMIC   NSTEMI (non-ST elevated myocardial infarction)   Acute on chronic systolic heart failure   Unspecified hypothyroidism  left upper lobe pneumonia   Time spent: 35 minutes    Hollice Espy  [facility] Pager [phone]. If 7PM-7AM, please contact night-coverage at [email or web address], password [facility] 11/26/2013, 10:58 AM  LOS: 2 days

## 2013-11-26 NOTE — Progress Notes (Signed)
ANTICOAGULATION CONSULT NOTE - Pharmacy Consult for Heparin  Indication: chest pain/ACS  Allergies  Allergen Reactions  . Celecoxib     Unknown   . Codeine     Unknown   . Iohexol     Unknown     Patient Measurements: Height: 5\' 9"  (175.3 cm) Weight: 191 lb 2.2 oz (86.7 kg) IBW/kg (Calculated) : 70.7  Vital Signs: Temp: 98.3 F (36.8 C) (02/22 0000) Temp src: Oral (02/22 0000) BP: 84/64 mmHg (02/21 2031) Pulse Rate: 80 (02/21 2031)   Estimated Creatinine Clearance: 64.9 ml/min (by C-G formula based on Cr of 1.04).  Assessment: 79 y/o male with NSTEMI for heparin  Goal of Therapy:  Heparin level 0.3-0.7 units/ml Monitor platelets by anticoagulation protocol: Yes   Plan:  Increase Heparin 1650 units/hr Follow-up am labs.   Geannie RisenGreg Trent Theisen, PharmD, BCPS   11/26/2013 12:42 AM

## 2013-11-26 NOTE — Progress Notes (Signed)
ANTICOAGULATION CONSULT NOTE - Initial Consult  Pharmacy Consult for Heparin  Indication: chest pain/ACS  Allergies  Allergen Reactions  . Celecoxib     Unknown   . Codeine     Unknown   . Iohexol     Unknown     Patient Measurements: Height: 5\' 9"  (175.3 cm) Weight: 194 lb 14.2 oz (88.4 kg) IBW/kg (Calculated) : 70.7  Vital Signs: Temp: 97.6 F (36.4 C) (02/22 2000) Temp src: Oral (02/22 2000) BP: 97/70 mmHg (02/22 2000) Pulse Rate: 86 (02/22 1646)   Estimated Creatinine Clearance: 63.6 ml/min (by C-G formula based on Cr of 1.07).  Medical History: Past Medical History  Diagnosis Date  . Coronary artery disease 09/2007    s/p CABG and s/p placement of drug-eluting stent tothe saphenous vein graft to right coronary arter   . CHF (congestive heart failure)     which is felt to be compensated/Adx 06/2009  . GERD (gastroesophageal reflux disease)   . Hypertension   . Hyperlipidemia   . Thyroid disease   . Obesity   . Nodule of right lung   . Porcelain gallbladder     Assessment: [name] for [place]. Confirm level is also therapeutic.    Hgb 11.1. Pltc 206. No bleeding issues noted.  Goal of Therapy:  Heparin level 0.3-0.7 units/ml Monitor platelets by anticoagulation protocol: Yes   Plan:  -Continue heparin drip at 1800 units/hr -F/u level in AM

## 2013-11-26 NOTE — Progress Notes (Signed)
    Subjective:  Mildly confused; Denies CP or dyspnea   Objective:  Filed Vitals:   11/26/13 0500 11/26/13 0600 11/26/13 0730 11/26/13 0800  BP: 100/61 101/61 107/57 106/57  Pulse: 86 85 87 88  Temp:   98.2 F (36.8 C)   TempSrc:   Oral   Resp: 23 25 26 28   Height:      Weight:      SpO2: 98% 99% 98% 96%    Intake/Output from previous day:  Intake/Output Summary (Last 24 hours) at 11/26/13 0847 Last data filed at 11/26/13 0800  Gross per 24 hour  Intake 1251.66 ml  Output   1950 ml  Net -698.34 ml    Physical Exam: Physical exam: Well-developed well-nourished in no acute distress.  Skin is warm and dry.  HEENT is normal.  Neck is supple.  Chest with basilar rales Cardiovascular exam is regular rate and rhythm.  Abdominal exam nontender or distended. No masses palpated. Extremities show trace edema. neuro grossly intact    Lab Results: Basic Metabolic Panel:  Recent Labs  40/98/1102/21/15 0530 11/26/13 0317  NA 146 144  K 4.3 3.6*  CL 110 109  CO2 22 22  GLUCOSE 112* 109*  BUN 34* 41*  CREATININE 1.04 1.07  CALCIUM 9.6 8.7   CBC:  Recent Labs  11/24/13 2300 11/26/13 0317  WBC 10.2 10.2  NEUTROABS 8.3*  --   HGB 12.6* 11.1*  HCT 38.2* 34.4*  MCV 90.5 91.2  PLT 260 206   Cardiac Enzymes:  Recent Labs  11/24/13 2246 11/25/13 0530 11/25/13 1019  TROPONINI 2.39* 1.85* 1.49*     Assessment/Plan:  1 non-ST elevation myocardial infarction-the patient's troponin is positive. He is not having active chest pain. Continue aspirin, plavix and statin as well as heparin. Hold ACEI and beta blocker due to hypotension. He will ultimately need an ischemia evaluation once he improves. 2 acute on chronic systolic congestive heart failure-the patient is mildly volume overloaded (basilar crackles this AM). Patient was hydrated due to hypotension; will hold further IVFs. Change Lasix to 20 mg IV daily. Follow renal function. 3 coronary artery disease-continue  aspirin and statin. 4 question pneumonia-management per primary care. 5 ischemic cardiomyopathy-hold ACE and beta blocker due to hypotension. 6 confusion-eval per primary care; needs to improve prior to further ischemia eval.  Olga MillersBrian Chadrick Sprinkle 11/26/2013, 8:47 AM

## 2013-11-26 NOTE — Progress Notes (Signed)
ANTICOAGULATION CONSULT NOTE - Initial Consult  Pharmacy Consult for Heparin  Indication: chest pain/ACS  Allergies  Allergen Reactions  . Celecoxib     Unknown   . Codeine     Unknown   . Iohexol     Unknown     Patient Measurements: Height: 5\' 9"  (175.3 cm) Weight: 194 lb 14.2 oz (88.4 kg) IBW/kg (Calculated) : 70.7  Vital Signs: Temp: 98 F (36.7 C) (02/22 1210) Temp src: Oral (02/22 1210) BP: 102/58 mmHg (02/22 1210) Pulse Rate: 88 (02/22 0800)   Estimated Creatinine Clearance: 63.6 ml/min (by C-G formula based on Cr of 1.07).  Medical History: Past Medical History  Diagnosis Date  . Coronary artery disease 09/2007    s/p CABG and s/p placement of drug-eluting stent tothe saphenous vein graft to right coronary arter   . CHF (congestive heart failure)     which is Nohelani [place] to be compensated/Adx 06/2009  . GERD (gastroesophageal reflux disease)   . Hypertension   . Hyperlipidemia   . Thyroid disease   . Obesity   . Nodule of right lung   . Porcelain gallbladder     Assessment: [name]. Heparin level this afternoon is therapeutic at 0.51.   Hgb 11.1. Pltc 206. No bleeding issues noted.  Goal of Therapy:  Heparin level 0.3-0.7 units/ml Monitor platelets by anticoagulation protocol: Yes   Plan:  -Continue heparin drip at 1800 units/hr -Check 8 hour HL (2100) -Daily CBC/HL -Monitor for bleeding -F/U MD plans  Arius Harnois C. Yamil Oelke, PharmD Clinical Pharmacist-Resident Pager: [phone] Pharmacy: [phone] 11/26/2013 1:09 PM

## 2013-11-26 NOTE — Progress Notes (Signed)
ANTICOAGULATION CONSULT NOTE - Initial Consult  Pharmacy Consult for Heparin  Indication: chest pain/ACS  Allergies  Allergen Reactions  . Celecoxib     Unknown   . Codeine     Unknown   . Iohexol     Unknown     Patient Measurements: Height: 5\' 9"  (175.3 cm) Weight: 194 lb 14.2 oz (88.4 kg) IBW/kg (Calculated) : 70.7  Vital Signs: Temp: 98.3 F (36.8 C) (02/22 0400) Temp src: Oral (02/22 0400) BP: 104/58 mmHg (02/22 0400) Pulse Rate: 83 (02/22 0400)   Estimated Creatinine Clearance: 65.5 ml/min (by C-G formula based on Cr of 1.04).  Medical History: Past Medical History  Diagnosis Date  . Coronary artery disease 09/2007    s/p CABG and s/p placement of drug-eluting stent tothe saphenous vein graft to right coronary arter   . CHF (congestive heart failure)     which is felt to be compensated/Adx 06/2009  . GERD (gastroesophageal reflux disease)   . Hypertension   . Hyperlipidemia   . Thyroid disease   . Obesity   . Nodule of right lung   . Porcelain gallbladder     Assessment: [name]. HL is 0.25 despite rate increase. Other labs as above.   Goal of Therapy:  Heparin level 0.3-0.7 units/ml Monitor platelets by anticoagulation protocol: Yes   Plan:  -Increase heparin drip to 1800 units/hr -1230 HL -Daily CBC/HL -Monitor for bleeding -F/U MD plans  Abran DukeLedford, Phoebe Marter 11/26/2013,4:18 AM

## 2013-11-27 DIAGNOSIS — I959 Hypotension, unspecified: Secondary | ICD-10-CM

## 2013-11-27 DIAGNOSIS — I25810 Atherosclerosis of coronary artery bypass graft(s) without angina pectoris: Secondary | ICD-10-CM

## 2013-11-27 DIAGNOSIS — I5022 Chronic systolic (congestive) heart failure: Secondary | ICD-10-CM

## 2013-11-27 DIAGNOSIS — I214 Non-ST elevation (NSTEMI) myocardial infarction: Secondary | ICD-10-CM

## 2013-11-27 DIAGNOSIS — I5023 Acute on chronic systolic (congestive) heart failure: Secondary | ICD-10-CM

## 2013-11-27 LAB — CBC
HEMATOCRIT: 35.8 % — AB (ref 39.0–52.0)
Hemoglobin: 12 g/dL — ABNORMAL LOW (ref 13.0–17.0)
MCH: 30.2 pg (ref 26.0–34.0)
MCHC: 33.5 g/dL (ref 30.0–36.0)
MCV: 89.9 fL (ref 78.0–100.0)
Platelets: 217 10*3/uL (ref 150–400)
RBC: 3.98 MIL/uL — ABNORMAL LOW (ref 4.22–5.81)
RDW: 13.6 % (ref 11.5–15.5)
WBC: 10.2 10*3/uL (ref 4.0–10.5)

## 2013-11-27 LAB — BASIC METABOLIC PANEL
BUN: 31 mg/dL — ABNORMAL HIGH (ref 6–23)
CO2: 25 meq/L (ref 19–32)
CREATININE: 0.94 mg/dL (ref 0.50–1.35)
Calcium: 9.3 mg/dL (ref 8.4–10.5)
Chloride: 104 mEq/L (ref 96–112)
GFR calc Af Amer: >90 mL/min (ref >90)
GFR calc non Af Amer: 79 mL/min — ABNORMAL LOW (ref >90)
Glucose, Bld: 109 mg/dL — ABNORMAL HIGH (ref 70–99)
Potassium: 4.3 mEq/L (ref 3.7–5.3)
SODIUM: 141 meq/L (ref 137–147)

## 2013-11-27 LAB — PROCALCITONIN: Procalcitonin: <0.1 ng/mL

## 2013-11-27 LAB — HEPARIN LEVEL (UNFRACTIONATED): Heparin Unfractionated: 0.38 IU/mL (ref 0.30–0.70)

## 2013-11-27 MED ORDER — SODIUM CHLORIDE 0.9 % IJ SOLN: 3.0000 mL | INTRAMUSCULAR | Status: DC | PRN

## 2013-11-27 MED ORDER — SODIUM CHLORIDE 0.9 % IV SOLN: 250.0000 mL | INTRAVENOUS | Status: DC | PRN

## 2013-11-27 MED ORDER — SODIUM CHLORIDE 0.9 % IV SOLN: INTRAVENOUS | Status: DC

## 2013-11-27 MED ORDER — LEVOTHYROXINE SODIUM 150 MCG PO TABS
262.0000 ug | ORAL_TABLET | Freq: Every day | ORAL | Status: DC
  Administered 2013-11-27 – 2013-12-14 (×18): 262 ug via ORAL
  Filled 2013-11-27 (×20): qty 1

## 2013-11-27 MED ORDER — SODIUM CHLORIDE 0.9 % IJ SOLN
3.0000 mL | Freq: Two times a day (BID) | INTRAMUSCULAR | Status: DC
  Administered 2013-11-27 – 2013-11-28 (×2): 3 mL via INTRAVENOUS

## 2013-11-27 MED ORDER — ASPIRIN 81 MG PO CHEW
81.0000 mg | CHEWABLE_TABLET | ORAL | Status: AC
  Administered 2013-11-28: 81 mg via ORAL
  Filled 2013-11-27: qty 1

## 2013-11-27 MED FILL — Heparin Sodium (Porcine) 100 Unt/ML in Sodium Chloride 0.45%: INTRAMUSCULAR | Qty: 250 | Status: AC

## 2013-11-27 NOTE — Progress Notes (Signed)
[facility] PROGRESS NOTE  [name] R Hollinshead ZOX:[id number] DOB: [birth year] DOA: 11/24/2013 PCP: Samuel JesterBUTLER, CYNTHIA, DO  Assessment/Plan: Active Problems:   HYPERLIPIDEMIA: Continue statin.    Essential hypertension, benign: Stable   CAD, AUTOLOGOUS BYPASS GRAFT: Continue aspirin.    CARDIOMYOPATHY, ISCHEMIC   NSTEMI (non-ST elevated myocardial infarction): Be monitored and managed conservatively. Troponins trending down. On heparin. Plavix added. For cath tomorrow    Acute on chronic systolic heart failure: Difficult to chart urine output because of incontinence. Patient required fluid boluses yesterday, pressure better today. It is of note that recently patient had to have his medications adjusted because of significant hypotension at home.    Unspecified hypothyroidism: Stable. Continue Synthroid. TSH normal.   left upper lobe pneumonia: Calcitonin level normal, however repeat film does confirm left upper lobe pneumonia. Afebrile. We'll start Levaquin by mouth  Altered mental status: Improved. Reportedly, from family, this is an acute issue. Patient much more interactive today as diuresed and pressure stabilized. Suspect hypoxia and hypotension as cause.  Code Status: Full code  Family Communication: Will call patient's sister  Disposition Plan: Keep in unit until after cath   Consultants:  [facility]  Procedures:  None  Antibiotics:  Levaquin 2/22-present  HPI/Subjective: Pt feeling ok.  No CP or SOB.    Objective: Filed Vitals:   11/27/13 1200  BP: 105/64  Pulse: 86  Temp: 98.7 F (37.1 C)  Resp: 29    Intake/Output Summary (Last 24 hours) at 11/27/13 1546 Last data filed at 11/27/13 1500  Gross per 24 hour  Intake    832 ml  Output   1050 ml  Net   -218 ml   Filed Weights   11/25/13 0500 11/26/13 0357 11/27/13 0400  Weight: 86.7 kg (191 lb 2.2 oz) 88.4 kg (194 lb 14.2 oz) 87.4 kg (192 lb 10.9 oz)    Exam:   General:  Alert and oriented  x2, no acute distress, more interactive and alert today  Cardiovascular: Regular rate and rhythm, S1-S2, 3/6 systolic ejection murmur  Respiratory: Decreased breath sounds throughout, more so at the bases  Abdomen: Soft, nontender, nondistended, positive bowel sounds  Musculoskeletal: No clubbing or cyanosis, trace pitting edema bilaterally   Data Reviewed: Basic Metabolic Panel:  Recent Labs Lab 11/24/13 2300 11/25/13 0530 11/26/13 0317 11/27/13 0341  NA 146 146 144 141  K 4.2 4.3 3.6* 4.3  CL 107 110 109 104  CO2 23 22 22 25   GLUCOSE 120* 112* 109* 109*  BUN 33* 34* 41* 31*  CREATININE 1.05 1.04 1.07 0.94  CALCIUM 9.6 9.6 8.7 9.3   Liver Function Tests:  Recent Labs Lab 11/24/13 2300  AST 19  ALT 12  ALKPHOS 81  BILITOT 0.9  PROT 6.4  ALBUMIN 3.1*   CBC:  Recent Labs Lab 11/24/13 2300 11/26/13 0317 11/27/13 0341  WBC 10.2 10.2 10.2  NEUTROABS 8.3*  --   --   HGB 12.6* 11.1* 12.0*  HCT 38.2* 34.4* 35.8*  MCV 90.5 91.2 89.9  PLT 260 206 217   Cardiac Enzymes:  Recent Labs Lab 11/24/13 2246 11/25/13 0530 11/25/13 1019  TROPONINI 2.39* 1.85* 1.49*   BNP (last 3 results) No results found for this basename: PROBNP,  in the last 8760 hours CBG: No results found for this basename: GLUCAP,  in the last 168 hours  Recent Results (from the past 240 hour(s))  MRSA PCR SCREENING     Status: None   Collection Time  11/24/13 10:43 PM      Result Value Ref Range Status   MRSA by PCR NEGATIVE  NEGATIVE Final   Comment:            The GeneXpert MRSA Assay (FDA     approved for NASAL specimens     only), is one component of a     comprehensive MRSA colonization     surveillance program. It is not     intended to diagnose MRSA     infection nor to guide or     monitor treatment for     MRSA infections.     Studies: Dg Chest Port 1 View  11/26/2013   CLINICAL DATA:  Chest pain and congestive heart failure.  EXAM: PORTABLE CHEST - 1 VIEW   COMPARISON:  11/25/2013  FINDINGS: Previous median sternotomy and CABG procedure. The heart size is moderately enlarged. There is persistent airspace consolidation within the left upper lobe. Mild diffuse edema is identified elsewhere. Calcification within the left ventricular apex is noted which may be the sequelae of prior infarct  IMPRESSION: 1. Left upper lobe pneumonia. 2. Superimposed pulmonary edema compatible with CHF.   Electronically Signed   By: Signa Kell M.D.   On: 11/26/2013 10:55   Dg Chest Port 1 View  11/25/2013   CLINICAL DATA:  Congestive heart failure. Hypotension. Coronary artery disease.  EXAM: PORTABLE CHEST - 1 VIEW  COMPARISON:  11/24/13  FINDINGS: Left upper lobe airspace disease shows no significant interval change. Cardiomegaly and pulmonary vascular congestion are stable. No evidence of pleural effusion. Prior CABG again noted.  IMPRESSION: No significant change in left upper lobe airspace disease, suspicious for pneumonia.   Electronically Signed   By: Myles Rosenthal M.D.   On: 11/25/2013 20:14    Scheduled Meds: . aspirin EC  325 mg Oral Daily  . atorvastatin  40 mg Oral q1800  . clopidogrel  75 mg Oral Q breakfast  . furosemide  20 mg Intravenous Daily  . levofloxacin  500 mg Oral Daily  . sodium chloride  3 mL Intravenous Q12H   Continuous Infusions: . heparin 1,800 Units/hr (11/27/13 0900)    Active Problems:   HYPERLIPIDEMIA   Essential hypertension, benign   CAD, AUTOLOGOUS BYPASS GRAFT   CARDIOMYOPATHY, ISCHEMIC   NSTEMI (non-ST elevated myocardial infarction)   Acute on chronic systolic heart failure   Unspecified hypothyroidism  left upper lobe pneumonia   Time spent: 25 minutes    Hollice Espy  [facility] Pager [phone]. If 7PM-7AM, please contact night-coverage at [email or web address], password [facility] 11/27/2013, 3:46 PM  LOS: 3 days

## 2013-11-27 NOTE — Progress Notes (Signed)
    Subjective:  AAO x 3; Denies CP or dyspnea  . aspirin EC  325 mg Oral Daily  . atorvastatin  40 mg Oral q1800  . clopidogrel  75 mg Oral Q breakfast  . furosemide  20 mg Intravenous Daily  . levofloxacin  500 mg Oral Daily  . sodium chloride  3 mL Intravenous Q12H   . heparin 1,800 Units/hr (11/27/13 0900)   Objective:  Filed Vitals:   11/27/13 0400 11/27/13 0800 11/27/13 0820 11/27/13 1200  BP: 103/56 88/67  105/64  Pulse:  76  86  Temp: 97.8 F (36.6 C)  98 F (36.7 C) 98.7 F (37.1 C)  TempSrc: Oral  Oral Oral  Resp: 31 29  29   Height:      Weight: 192 lb 10.9 oz (87.4 kg)     SpO2:  98%  98%    Intake/Output from previous day:  Intake/Output Summary (Last 24 hours) at 11/27/13 1307 Last data filed at 11/27/13 1200  Gross per 24 hour  Intake    854 ml  Output   1100 ml  Net   -246 ml    Physical Exam: Physical exam: Well-developed well-nourished in no acute distress.  Skin is warm and dry.  HEENT is normal.  Neck is supple.  Chest with basilar rales Cardiovascular exam is regular rate and rhythm.  Abdominal exam nontender or distended. No masses palpated. Extremities show trace edema. neuro grossly intact  Lab Results: Basic Metabolic Panel:  Recent Labs  16/07/9601/22/15 0317 11/27/13 0341  NA 144 141  K 3.6* 4.3  CL 109 104  CO2 22 25  GLUCOSE 109* 109*  BUN 41* 31*  CREATININE 1.07 0.94  CALCIUM 8.7 9.3   CBC:  Recent Labs  11/24/13 2300 11/26/13 0317 11/27/13 0341  WBC 10.2 10.2 10.2  NEUTROABS 8.3*  --   --   HGB 12.6* 11.1* 12.0*  HCT 38.2* 34.4* 35.8*  MCV 90.5 91.2 89.9  PLT 260 206 217   Cardiac Enzymes:  Recent Labs  11/24/13 2246 11/25/13 0530 11/25/13 1019  TROPONINI 2.39* 1.85* 1.49*     Assessment/Plan:   1 NSTEMI -the patient's troponin is positive, now downtrending. He is not having active chest pain. Continue aspirin, plavix and statin as well as heparin. Hold ACEI and beta blocker due to hypotension. We  will schedule for a cardiac cath for tomorrow.   2 acute on chronic systolic congestive heart failure-the patient is mildly volume overloaded (basilar crackles this AM). Patient was hydrated due to hypotension; will hold further IVFs. Change Lasix to 20 mg IV daily. Renal function stable.  3 coronary artery disease-continue aspirin and statin.  4 question pneumonia-management per primary care.  5 ischemic cardiomyopathy-hold ACE and beta blocker due to hypotension.  6 confusion-eval per primary care; needs to improve prior to further ischemia eval.  Tobias AlexanderNELSON, Lashauna Arpin, H 11/27/2013, 1:07 PM

## 2013-11-27 NOTE — Care Management Note (Addendum)
  Page 2 of 2   12/14/2013     2:33:05 PM   CARE MANAGEMENT NOTE 12/14/2013  Patient:  James [name],James R   Account Number:  [id number]  Date Initiated:  11/27/2013  Documentation initiated by:  Junius CreamerWELL,DEBBIE  Subjective/Objective Assessment:   adm w mi     Action/Plan:   lives w friend, pcp dr Aram Beechamcynthia butler   Anticipated DC Date:     Anticipated DC Plan:        DC Planning Services  CM consult  Medication Assistance      Choice offered to / List presented to:             Status of service:  Completed, signed off Medicare Important Message given?   (If response is "NO", the following Medicare IM given date fields will be blank) Date Medicare IM given:   Date Additional Medicare IM given:    Discharge Disposition:  SKILLED NURSING FACILITY  Per UR Regulation:  Reviewed for med. necessity/level of care/duration of stay  If discussed at Long Length of Stay Meetings, dates discussed:   11/30/2013  12/05/2013  12/07/2013  12/12/2013  12/14/2013    Comments:  12/14/2013 Dispostion Plan:  D/[facility] Per (SW Lovette Clicheonna Crowder) [name] RN, BSN, [place] [facility], [place] 12/14/2013   12/13/2013 Dispostion Plan:  D/c order for snf today (SW Lovette Clicheonna Crowder notified) Donato Schultzrystal Ngan Qualls RN, BSN, M[facility]L, [facility] 12/13/2013  12/11/2013 4:37 CM reached PCG/Sister/Gaynel Mabe by phone. States crisis with husband almost passing away this am and has changed her mind re:  d/c plan to home.  Now prefers SNF [facility] and states she does not feel she will be able to lift and tug at patient and provide the level of care needed. SW nofied of d/c plan change Disposition Plan:  SNF Dianelly Ferran RN, BSN, [place], [place] 12/11/2013   12/11/2013 Some confusion reported and patient pulled PIC out last hs. CM consult with patient this am.  No family present. Patient unable to give details of d/c plans.  States hx/o living with Sister/Gaynell & Johnny Mabe [phone] x 15  years. CM will attempt to reach sister. Hx/o no call back from premious CM attmepts. PT Recs: SNF: However, sister refuses and plans to d/c to home Dispositon Plan pending: Nalany Steedley RN,  BSN, MSHL, CCM 12/11/2013  12/11/13 1030 Camellia Wood, RN, BSN, [place] [phone] Left message for sister Rush LandmarkGanell Mabe [phone] on Friday, 3/6 for [facility] choice vs SNF placement.  Phone call was not returned.  Will continue to follow-up.  2/27  1602 debbie dowell rn,bsn according to cm sec pt has a[employer] medicare complete. left pt 30day free xarelto card. placed prior approval form on shadow chart for md to fill out for xarelto.  2/25 1001 debbie dowell rn,bsn if xarelto or eliquis needed copay is 45.00 but needs prior approval.

## 2013-11-27 NOTE — Progress Notes (Signed)
ANTICOAGULATION CONSULT NOTE   Pharmacy Consult for Heparin  Indication: chest pain/ACS  Allergies  Allergen Reactions  . Celecoxib     Unknown   . Codeine     Unknown   . Iohexol     Unknown     Patient Measurements: Height: 5\' 9"  (175.3 cm) Weight: 192 lb 10.9 oz (87.4 kg) IBW/kg (Calculated) : 70.7  Vital Signs: Temp: 98 F (36.7 C) (02/23 0820) Temp src: Oral (02/23 0820) BP: 88/67 mmHg (02/23 0800) Pulse Rate: 76 (02/23 0800)   Estimated Creatinine Clearance: 72 ml/min (by C-G formula based on Cr of 0.94).   Assessment: [name] for [place]I. Heparin noted at goal (HL= 0.38) on 1800 units/hr.   Goal of Therapy:  Heparin level 0.3-0.7 units/ml Monitor platelets by anticoagulation protocol: Yes   Plan:  -Continue heparin drip at 1800 units/hr -F/u level in AM -Will follow pland  Harland GermanAndrew Regan Mcbryar, Pharm D 11/27/2013 9:13 AM

## 2013-11-28 DIAGNOSIS — M79609 Pain in unspecified limb: Secondary | ICD-10-CM

## 2013-11-28 DIAGNOSIS — E663 Overweight: Secondary | ICD-10-CM | POA: Insufficient documentation

## 2013-11-28 LAB — CBC
HEMATOCRIT: 35 % — AB (ref 39.0–52.0)
Hemoglobin: 11.7 g/dL — ABNORMAL LOW (ref 13.0–17.0)
MCH: 29.5 pg (ref 26.0–34.0)
MCHC: 33.4 g/dL (ref 30.0–36.0)
MCV: 88.2 fL (ref 78.0–100.0)
Platelets: 204 10*3/uL (ref 150–400)
RBC: 3.97 MIL/uL — AB (ref 4.22–5.81)
RDW: 13.4 % (ref 11.5–15.5)
WBC: 9.8 10*3/uL (ref 4.0–10.5)

## 2013-11-28 LAB — HEPARIN LEVEL (UNFRACTIONATED): Heparin Unfractionated: 0.43 IU/mL (ref 0.30–0.70)

## 2013-11-28 LAB — PROTIME-INR
INR: 1.26 (ref 0.00–1.49)
Prothrombin Time: 15.5 seconds — ABNORMAL HIGH (ref 11.6–15.2)

## 2013-11-28 MED ORDER — SODIUM CHLORIDE 0.9 % IJ SOLN
3.0000 mL | Freq: Two times a day (BID) | INTRAMUSCULAR | Status: DC
  Administered 2013-11-28: 3 mL via INTRAVENOUS

## 2013-11-28 MED ORDER — METHYLPREDNISOLONE SODIUM SUCC 125 MG IJ SOLR
125.0000 mg | INTRAMUSCULAR | Status: AC
  Administered 2013-11-29: 125 mg via INTRAVENOUS
  Filled 2013-11-28: qty 2

## 2013-11-28 MED ORDER — DIPHENHYDRAMINE HCL 50 MG/ML IJ SOLN
25.0000 mg | INTRAMUSCULAR | Status: DC
  Administered 2013-11-29: 25 mg via INTRAVENOUS
  Filled 2013-11-28: qty 0.5

## 2013-11-28 MED ORDER — SODIUM CHLORIDE 0.9 % IV SOLN: 250.0000 mL | INTRAVENOUS | Status: DC | PRN

## 2013-11-28 MED ORDER — SODIUM CHLORIDE 0.9 % IJ SOLN: 3.0000 mL | INTRAMUSCULAR | Status: DC | PRN

## 2013-11-28 MED ORDER — SODIUM CHLORIDE 0.9 % IV SOLN: INTRAVENOUS | Status: DC

## 2013-11-28 MED ORDER — FAMOTIDINE 20 MG PO TABS
20.0000 mg | ORAL_TABLET | ORAL | Status: AC
  Administered 2013-11-29: 20 mg via ORAL
  Filled 2013-11-28 (×2): qty 1

## 2013-11-28 NOTE — Progress Notes (Signed)
ANTICOAGULATION CONSULT NOTE   Pharmacy Consult for Heparin  Indication: chest pain/ACS  Allergies  Allergen Reactions  . Celecoxib     Unknown   . Codeine     Unknown   . Iohexol     Unknown     Patient Measurements: Height: 5\' 9"  (175.3 cm) Weight: 194 lb 3.6 oz (88.1 kg) IBW/kg (Calculated) : 70.7  Vital Signs: Temp: 98.2 F (36.8 C) (02/24 0805) Temp src: Oral (02/24 0805) BP: 94/55 mmHg (02/24 0805) Pulse Rate: 87 (02/24 0805)   Estimated Creatinine Clearance: 72.3 ml/min (by C-G formula based on Cr of 0.94).   Assessment: [name] [place] heparin for N[place]. Heparin noted at goal (HL= 0.43) on 1800 units/hr. Patient noted for cath today.   Goal of Therapy:  Heparin level 0.3-0.7 units/ml Monitor platelets by anticoagulation protocol: Yes   Plan:  -Continue heparin drip at 1800 units/hr -Will follow plans post cath   Harland GermanAndrew Zarif Rathje, Pharm D 11/28/2013 9:24 AM

## 2013-11-28 NOTE — Progress Notes (Signed)
    Subjective:  AAO x 3; Denies CP or dyspnea, mental status has improved  . aspirin EC  325 mg Oral Daily  . atorvastatin  40 mg Oral q1800  . clopidogrel  75 mg Oral Q breakfast  . furosemide  20 mg Intravenous Daily  . levofloxacin  500 mg Oral Daily  . levothyroxine  262 mcg Oral QAC breakfast  . sodium chloride  3 mL Intravenous Q12H  . sodium chloride  3 mL Intravenous Q12H   . sodium chloride 10 mL/hr at 11/28/13 0540  . heparin 1,800 Units/hr (11/28/13 1147)   Objective:  Filed Vitals:   11/28/13 0800 11/28/13 0805 11/28/13 0806 11/28/13 1206  BP:  94/55  102/54  Pulse:  87  78  Temp:  98.2 F (36.8 C)  97.5 F (36.4 C)  TempSrc:  Oral  Oral  Resp: 15 15 21 15   Height:      Weight:      SpO2:  95%  99%    Intake/Output from previous day:  Intake/Output Summary (Last 24 hours) at 11/28/13 1244 Last data filed at 11/28/13 1114  Gross per 24 hour  Intake    692 ml  Output   1250 ml  Net   -558 ml    Physical Exam: Physical exam: Well-developed well-nourished in no acute distress.  Skin is warm and dry.  HEENT is normal.  Neck is supple.  Chest with basilar rales Cardiovascular exam is regular rate and rhythm.  Abdominal exam nontender or distended. No masses palpated. Extremities show mild edema, right > left. neuro grossly intact  Lab Results: Basic Metabolic Panel:  Recent Labs  47/82/9502/22/15 0317 11/27/13 0341  NA 144 141  K 3.6* 4.3  CL 109 104  CO2 22 25  GLUCOSE 109* 109*  BUN 41* 31*  CREATININE 1.07 0.94  CALCIUM 8.7 9.3   CBC:  Recent Labs  11/27/13 0341 11/28/13 0245  WBC 10.2 9.8  HGB 12.0* 11.7*  HCT 35.8* 35.0*  MCV 89.9 88.2  PLT 217 204     Assessment/Plan:   1. NSTEMI -the patient's troponin is positive, now downtrending. He is not having active chest pain. Continue aspirin, plavix and statin as well as heparin. Hold ACEI and beta blocker due to hypotension. We will schedule for a cardiac cath for tomorrow. We will  premedicate him with steroids/benadryl/ranitidine as he has h/o rash after iodine contrast.  2. Acute on chronic systolic congestive heart failure-the patient is mildly volume overloaded (basilar crackles this AM). Patient was hydrated due to hypotension; will hold further IVFs. Change Lasix to 20 mg IV daily. Renal function stable.  3. Coronary artery disease-continue aspirin and statin.  4. Right unilateral leg swelling with pain - we will order LE venous duplex  5. Ischemic cardiomyopathy-hold ACE and beta blocker due to hypotension.  6. Confusion-significantly improved.  Tobias AlexanderELSON, Alexsis Kathman, H 11/28/2013, 12:44 PM

## 2013-11-28 NOTE — Progress Notes (Addendum)
[facility] PROGRESS NOTE  James [name] ZOX:[id number] DOB: 06-Sep-1935 DOA: 11/24/2013 PCP: Samuel Jester, DO  Interim summary:  Patient is a 79 year old white male with past medical history of CAD status post CABG as well as congestive heart failure, grade 1 diastolic and systolic with a decreased ejection fraction of 40% who was transferred from [facility] emergency room after presenting there on 2/20 with complaints of shortness of breath and lower extremity swelling x2 days. He was found to have a troponin of 3.5 and chest x-ray noted pneumonia and congestive heart failure so patient was transferred to the hospitalist service and placed in the step down unit.  Patient's cardiologist is Dr. Antoine Poche of [facility] heart care and were consulted. Patient was slightly confused, according to patient's cousin (listed as his sister and only family), this is an acute finding.  Attempts at diuresing the patient related secondary to hypotension, patient's blood pressure dropped to as low as 50s. Critical care was consulted initially for possible dopamine involvement, however patient did respond to gentle fluid boluses. As troponins were trending down, pneumonia treated, and blood pressure stabilized, patient's mentation improved. He is felt to be stable and plans are for cardiac catheterization on 2/24.  Assessment/Plan: Active Problems:   HYPERLIPIDEMIA: Continue statin.    Essential hypertension, benign: Stable    CAD, AUTOLOGOUS BYPASS GRAFT: Continue aspirin.    CARDIOMYOPATHY, ISCHEMIC   NSTEMI (non-ST elevated myocardial infarction): Be monitored and managed conservatively. Troponins trending down. On heparin. Plavix added. For cath today.    Acute on chronic systolic heart failure: Difficult to chart urine output because of incontinence. Patient required fluid boluses yesterday, pressure better today. It is of note that recently patient had to have his medications adjusted because of  significant hypotension at home.    Unspecified hypothyroidism: Stable. Continue Synthroid. TSH normal.   left upper lobe pneumonia: pro-Calcitonin level normal, however repeat film did confirm left upper lobe pneumonia. Afebrile. On Levaquin by mouth.  Altered mental status: Resolved. . Patient much more interactive as diuresed and pressure stabilized. Suspected hypoxia and hypotension as cause.  Hypotension: Looks to be a chronic problem for the patient, patient had medications adjusted recently as outpatient because of hypotension. Initial attempts at diuresis were limited and currently beta blocker, ACE inhibitor and Lasix are on hold.  Overweight: Patient meets criteria with BMI of 28  Code Status: Full code  Family Communication: Spoke to patient's cousin (listed as his sister)  Disposition Plan: Keep in unit until after cath   Consultants:  [facility]  Procedures:  None  Antibiotics:  Levaquin 2/22-present  HPI/Subjective: Pt feeling ok.  No CP or SOB.  Ready for catheterization.  Objective: Filed Vitals:   11/28/13 0806  BP:   Pulse:   Temp:   Resp: 21    Intake/Output Summary (Last 24 hours) at 11/28/13 0934 Last data filed at 11/28/13 0900  Gross per 24 hour  Intake    746 ml  Output   1000 ml  Net   -254 ml   Filed Weights   11/26/13 0357 11/27/13 0400 11/28/13 0416  Weight: 88.4 kg (194 lb 14.2 oz) 87.4 kg (192 lb 10.9 oz) 88.1 kg (194 lb 3.6 oz)    Exam:   General:  Alert and oriented x2, no acute distress, more interactive and alert today  Cardiovascular: Regular rate and rhythm, S1-S2, 3/6 systolic ejection murmur  Respiratory: Decreased breath sounds throughout, more so at the bases  Abdomen: Soft, nontender,  nondistended, positive bowel sounds  Musculoskeletal: No clubbing or cyanosis, trace pitting edema bilaterally   Data Reviewed: Basic Metabolic Panel:  Recent Labs Lab 11/24/13 2300 11/25/13 0530 11/26/13 0317  11/27/13 0341  NA 146 146 144 141  K 4.2 4.3 3.6* 4.3  CL 107 110 109 104  CO2 23 22 22 25   GLUCOSE 120* 112* 109* 109*  BUN 33* 34* 41* 31*  CREATININE 1.05 1.04 1.07 0.94  CALCIUM 9.6 9.6 8.7 9.3   Liver Function Tests:  Recent Labs Lab 11/24/13 2300  AST 19  ALT 12  ALKPHOS 81  BILITOT 0.9  PROT 6.4  ALBUMIN 3.1*   CBC:  Recent Labs Lab 11/24/13 2300 11/26/13 0317 11/27/13 0341 11/28/13 0245  WBC 10.2 10.2 10.2 9.8  NEUTROABS 8.3*  --   --   --   HGB 12.6* 11.1* 12.0* 11.7*  HCT 38.2* 34.4* 35.8* 35.0*  MCV 90.5 91.2 89.9 88.2  PLT 260 206 217 204   Cardiac Enzymes:  Recent Labs Lab 11/24/13 2246 11/25/13 0530 11/25/13 1019  TROPONINI 2.39* 1.85* 1.49*   BNP (last 3 results) No results found for this basename: PROBNP,  in the last 8760 hours CBG: No results found for this basename: GLUCAP,  in the last 168 hours  Recent Results (from the past 240 hour(s))  MRSA PCR SCREENING     Status: None   Collection Time    11/24/13 10:43 PM      Result Value Ref Range Status   MRSA by PCR NEGATIVE  NEGATIVE Final   Comment:            The GeneXpert MRSA Assay (FDA     approved for NASAL specimens     only), is one component of a     comprehensive MRSA colonization     surveillance program. It is not     intended to diagnose MRSA     infection nor to guide or     monitor treatment for     MRSA infections.     Studies: Dg Chest Port 1 View  11/26/2013   CLINICAL DATA:  Chest pain and congestive heart failure.  EXAM: PORTABLE CHEST - 1 VIEW  COMPARISON:  11/25/2013  FINDINGS: Previous median sternotomy and CABG procedure. The heart size is moderately enlarged. There is persistent airspace consolidation within the left upper lobe. Mild diffuse edema is identified elsewhere. Calcification within the left ventricular apex is noted which may be the sequelae of prior infarct  IMPRESSION: 1. Left upper lobe pneumonia. 2. Superimposed pulmonary edema compatible  with CHF.   Electronically Signed   By: Signa Kell M.D.   On: 11/26/2013 10:55    Scheduled Meds: . aspirin EC  325 mg Oral Daily  . atorvastatin  40 mg Oral q1800  . clopidogrel  75 mg Oral Q breakfast  . furosemide  20 mg Intravenous Daily  . levofloxacin  500 mg Oral Daily  . levothyroxine  262 mcg Oral QAC breakfast  . sodium chloride  3 mL Intravenous Q12H  . sodium chloride  3 mL Intravenous Q12H   Continuous Infusions: . sodium chloride 10 mL/hr at 11/28/13 0540  . heparin 1,800 Units/hr (11/27/13 2135)    Active Problems:   HYPERLIPIDEMIA   Essential hypertension, benign   CAD, AUTOLOGOUS BYPASS GRAFT   CARDIOMYOPATHY, ISCHEMIC   NSTEMI (non-ST elevated myocardial infarction)   Acute on chronic systolic heart failure   Unspecified hypothyroidism  left upper lobe pneumonia  Time spent: 25 minutes    Hollice EspyKRISHNAN,SENDIL K  [facility] Pager [phone]. If 7PM-7AM, please contact night-coverage at [email or web address], password [email or web address] 11/28/2013, 9:34 AM  LOS: 4 days

## 2013-11-28 NOTE — Progress Notes (Signed)
Bilateral lower extremity venous duplex completed.  Right:  DVT noted in the posterior tibial and peroneal veins.  No evidence of superficial thrombosis.  No Baker's cyst.  Left:  No evidence of DVT, superficial thrombosis, or Baker's cyst.   

## 2013-11-29 ENCOUNTER — Encounter (HOSPITAL_COMMUNITY)
Admission: EM | Disposition: A | Discharge status: Skilled Nursing Facility | Payer: Self-pay | Source: Other Acute Inpatient Hospital | Attending: Internal Medicine

## 2013-11-29 DIAGNOSIS — J9601 Acute respiratory failure with hypoxia: Secondary | ICD-10-CM | POA: Diagnosis present

## 2013-11-29 DIAGNOSIS — I82409 Acute embolism and thrombosis of unspecified deep veins of unspecified lower extremity: Secondary | ICD-10-CM | POA: Diagnosis present

## 2013-11-29 DIAGNOSIS — N182 Chronic kidney disease, stage 2 (mild): Secondary | ICD-10-CM | POA: Diagnosis present

## 2013-11-29 DIAGNOSIS — I2510 Atherosclerotic heart disease of native coronary artery without angina pectoris: Secondary | ICD-10-CM

## 2013-11-29 HISTORY — PX: LEFT HEART CATHETERIZATION WITH CORONARY/GRAFT ANGIOGRAM: SHX5450

## 2013-11-29 LAB — PROCALCITONIN: Procalcitonin: <0.1 ng/mL

## 2013-11-29 LAB — CBC
HCT: 36.3 % — ABNORMAL LOW (ref 39.0–52.0)
Hemoglobin: 12 g/dL — ABNORMAL LOW (ref 13.0–17.0)
MCH: 29.1 pg (ref 26.0–34.0)
MCHC: 33.1 g/dL (ref 30.0–36.0)
MCV: 88.1 fL (ref 78.0–100.0)
PLATELETS: 219 10*3/uL (ref 150–400)
RBC: 4.12 MIL/uL — ABNORMAL LOW (ref 4.22–5.81)
RDW: 13.5 % (ref 11.5–15.5)
WBC: 10.1 10*3/uL (ref 4.0–10.5)

## 2013-11-29 LAB — BASIC METABOLIC PANEL
BUN: 29 mg/dL — ABNORMAL HIGH (ref 6–23)
CALCIUM: 9 mg/dL (ref 8.4–10.5)
CHLORIDE: 102 meq/L (ref 96–112)
CO2: 25 mEq/L (ref 19–32)
Creatinine, Ser: 1.01 mg/dL (ref 0.50–1.35)
GFR calc Af Amer: 81 mL/min — ABNORMAL LOW (ref >90)
GFR, EST NON AFRICAN AMERICAN: 70 mL/min — AB (ref >90)
Glucose, Bld: 110 mg/dL — ABNORMAL HIGH (ref 70–99)
Potassium: 3.9 mEq/L (ref 3.7–5.3)
Sodium: 140 mEq/L (ref 137–147)

## 2013-11-29 LAB — POCT ACTIVATED CLOTTING TIME: ACTIVATED CLOTTING TIME: 110 s

## 2013-11-29 LAB — HEPARIN LEVEL (UNFRACTIONATED): Heparin Unfractionated: 0.34 IU/mL (ref 0.30–0.70)

## 2013-11-29 SURGERY — LEFT HEART CATHETERIZATION WITH CORONARY/GRAFT ANGIOGRAM
Anesthesia: LOCAL

## 2013-11-29 MED ORDER — NITROGLYCERIN 0.2 MG/ML ON CALL CATH LAB
INTRAVENOUS | Status: AC
  Filled 2013-11-29: qty 1

## 2013-11-29 MED ORDER — MIDAZOLAM HCL 2 MG/2ML IJ SOLN
INTRAMUSCULAR | Status: AC
  Filled 2013-11-29: qty 2

## 2013-11-29 MED ORDER — SODIUM CHLORIDE 0.9 % IV SOLN
INTRAVENOUS | Status: DC
Start: 2013-11-29 — End: 2013-12-01

## 2013-11-29 MED ORDER — ASPIRIN EC 81 MG PO TBEC
81.0000 mg | DELAYED_RELEASE_TABLET | Freq: Every day | ORAL | Status: DC
  Administered 2013-11-30 – 2013-12-07 (×8): 81 mg via ORAL
  Filled 2013-11-29 (×8): qty 1

## 2013-11-29 MED ORDER — LIDOCAINE HCL (PF) 1 % IJ SOLN
INTRAMUSCULAR | Status: AC
  Filled 2013-11-29: qty 30

## 2013-11-29 MED ORDER — HEPARIN (PORCINE) IN NACL 2-0.9 UNIT/ML-% IJ SOLN
INTRAMUSCULAR | Status: AC
  Filled 2013-11-29: qty 1500

## 2013-11-29 MED ORDER — FUROSEMIDE 10 MG/ML IJ SOLN
INTRAMUSCULAR | Status: AC
  Filled 2013-11-29: qty 4

## 2013-11-29 MED ORDER — HEPARIN (PORCINE) IN NACL 100-0.45 UNIT/ML-% IJ SOLN
1900.0000 [IU]/h | INTRAMUSCULAR | Status: DC
  Administered 2013-11-30: 1700 [IU]/h via INTRAVENOUS
  Administered 2013-11-30 – 2013-12-02 (×4): 1850 [IU]/h via INTRAVENOUS
  Administered 2013-12-03: 2100 [IU]/h via INTRAVENOUS
  Administered 2013-12-03: 1850 [IU]/h via INTRAVENOUS
  Administered 2013-12-03: 2100 [IU]/h via INTRAVENOUS
  Administered 2013-12-04: 1900 [IU]/h via INTRAVENOUS
  Filled 2013-11-29 (×13): qty 250

## 2013-11-29 NOTE — Progress Notes (Signed)
ANTICOAGULATION CONSULT NOTE   Pharmacy Consult for Heparin  Indication: chest pain/ACS, DVT  Allergies  Allergen Reactions  . Celecoxib     Unknown   . Codeine     Unknown   . Iohexol     Unknown     Patient Measurements: Height: 5\' 9"  (175.3 cm) Weight: 194 lb 7.1 oz (88.2 kg) IBW/kg (Calculated) : 70.7  Vital Signs: Temp: 97.6 F (36.4 C) (02/25 1239) Temp src: Oral (02/25 1239) BP: 101/75 mmHg (02/25 1239) Pulse Rate: 81 (02/25 1539)   Estimated Creatinine Clearance: 67.3 ml/min (by C-G formula based on Cr of 1.01).   Assessment: [name] tx from [place] heparin for NSTEMI and now noted with new RLE DVT. Pt went to cath lab today.    Heparin to be restarted 8 hr after sheath pulled. Sheath out 1730 start heparin at 0200 2/26.  Heparin drip rate 1700 uts/hr.  No bleeding noted.  Goal of Therapy:  Heparin level 0.3-0.7 units/ml Monitor platelets by anticoagulation protocol: Yes   Plan:  Restart heparin drip at 1700 units/hr Draw Heparin level 6hr after start and daily  Leota SauersLisa Tanaka Gillen Pharm.D. CPP, BCPS Clinical Pharmacist [phone] 11/29/2013 6:06 PM

## 2013-11-29 NOTE — Progress Notes (Signed)
   Subjective:  AAO x 3; Denies CP or dyspnea, mental status has improved  . aspirin EC  325 mg Oral Daily  . atorvastatin  40 mg Oral q1800  . clopidogrel  75 mg Oral Q breakfast  . diphenhydrAMINE  25 mg Intravenous Pre-Cath  . furosemide  20 mg Intravenous Daily  . levofloxacin  500 mg Oral Daily  . levothyroxine  262 mcg Oral QAC breakfast  . methylPREDNISolone (SOLU-MEDROL) injection  125 mg Intravenous Pre-Cath   . sodium chloride 10 mL/hr at 11/28/13 0540  . heparin 1,800 Units/hr (11/29/13 0207)   Objective:  Filed Vitals:   11/29/13 0000 11/29/13 0434 11/29/13 0438 11/29/13 0717  BP: 90/52  117/70 107/57  Pulse:    93  Temp: 97.4 F (36.3 C) 97.7 F (36.5 C)  98.6 F (37 C)  TempSrc: Oral Oral  Oral  Resp: 11  16 17   Height:      Weight:  194 lb 7.1 oz (88.2 kg)    SpO2: 96%  98% 98%    Intake/Output from previous day:  Intake/Output Summary (Last 24 hours) at 11/29/13 [id number] Last data filed at 11/29/13 0700  Gross per 24 hour  Intake 459.33 ml  Output   1370 ml  Net -910.67 ml    Physical Exam: Physical exam: Well-developed well-nourished in no acute distress.  Skin is warm and dry.  HEENT is normal.  Neck is supple.  Chest with basilar rales Cardiovascular exam is regular rate and rhythm.  Abdominal exam nontender or distended. No masses palpated. Extremities show moderate pitting up to the knee edema on the right LE neuro grossly intact  Lab Results: Basic Metabolic Panel:  Recent Labs  19/14/7802/23/15 0341 11/29/13 0312  NA 141 140  K 4.3 3.9  CL 104 102  CO2 25 25  GLUCOSE 109* 110*  BUN 31* 29*  CREATININE 0.94 1.01  CALCIUM 9.3 9.0   CBC:  Recent Labs  11/28/13 0245 11/29/13 0312  WBC 9.8 10.1  HGB 11.7* 12.0*  HCT 35.0* 36.3*  MCV 88.2 88.1  PLT 204 219     Assessment/Plan:   1. NSTEMI -the patient's troponin is positive, now downtrending. He is not having active chest pain. Continue aspirin, plavix and statin as well as  heparin. Hold ACEI and beta blocker due to hypotension. Cardiac cath today. He is being premedicated with steroids/benadryl/ranitidine as he has h/o rash after iodine contrast.  2. Acute on chronic systolic congestive heart failure - echo from 2011 LVEF 40-45% with akinesis of the mid-distal lateral and apical myocardium), new echo is pending, the patient isminimally volume overloaded (basilar crackles this AM). Patient was hydrated due to hypotension; will hold further IVFs. Change Lasix to 20 mg IV daily. Renal function stable.  3. Acute right lower extremity DVT - in the posterior tibial and peroneal veins, currently on Heparin, we will have to switch to Xarelto/coumadine before D/C  4. Coronary artery disease-continue aspirin and statin.  5. Ischemic cardiomyopathy-hold ACE and beta blocker due to hypotension.  6. Confusion-significantly improved.   Tobias AlexanderELSON, Darwin Rothlisberger, H 11/29/2013, 8:12 AM

## 2013-11-29 NOTE — H&P (View-Only) (Signed)
   Subjective:  AAO x 3; Denies CP or dyspnea, mental status has improved  . aspirin EC  325 mg Oral Daily  . atorvastatin  40 mg Oral q1800  . clopidogrel  75 mg Oral Q breakfast  . diphenhydrAMINE  25 mg Intravenous Pre-Cath  . furosemide  20 mg Intravenous Daily  . levofloxacin  500 mg Oral Daily  . levothyroxine  262 mcg Oral QAC breakfast  . methylPREDNISolone (SOLU-MEDROL) injection  125 mg Intravenous Pre-Cath   . sodium chloride 10 mL/hr at 11/28/13 0540  . heparin 1,800 Units/hr (11/29/13 0207)   Objective:  Filed Vitals:   11/29/13 0000 11/29/13 0434 11/29/13 0438 11/29/13 0717  BP: 90/52  117/70 107/57  Pulse:    93  Temp: 97.4 F (36.3 C) 97.7 F (36.5 C)  98.6 F (37 C)  TempSrc: Oral Oral  Oral  Resp: 11  16 17  Height:      Weight:  194 lb 7.1 oz (88.2 kg)    SpO2: 96%  98% 98%    Intake/Output from previous day:  Intake/Output Summary (Last 24 hours) at 11/29/13 0812 Last data filed at 11/29/13 0700  Gross per 24 hour  Intake 459.33 ml  Output   1370 ml  Net -910.67 ml    Physical Exam: Physical exam: Well-developed well-nourished in no acute distress.  Skin is warm and dry.  HEENT is normal.  Neck is supple.  Chest with basilar rales Cardiovascular exam is regular rate and rhythm.  Abdominal exam nontender or distended. No masses palpated. Extremities show moderate pitting up to the knee edema on the right LE neuro grossly intact  Lab Results: Basic Metabolic Panel:  Recent Labs  11/27/13 0341 11/29/13 0312  NA 141 140  K 4.3 3.9  CL 104 102  CO2 25 25  GLUCOSE 109* 110*  BUN 31* 29*  CREATININE 0.94 1.01  CALCIUM 9.3 9.0   CBC:  Recent Labs  11/28/13 0245 11/29/13 0312  WBC 9.8 10.1  HGB 11.7* 12.0*  HCT 35.0* 36.3*  MCV 88.2 88.1  PLT 204 219     Assessment/Plan:   1. NSTEMI -the patient's troponin is positive, now downtrending. He is not having active chest pain. Continue aspirin, plavix and statin as well as  heparin. Hold ACEI and beta blocker due to hypotension. Cardiac cath today. He is being premedicated with steroids/benadryl/ranitidine as he has h/o rash after iodine contrast.  2. Acute on chronic systolic congestive heart failure - echo from 2011 LVEF 40-45% with akinesis of the mid-distal lateral and apical myocardium), new echo is pending, the patient isminimally volume overloaded (basilar crackles this AM). Patient was hydrated due to hypotension; will hold further IVFs. Change Lasix to 20 mg IV daily. Renal function stable.  3. Acute right lower extremity DVT - in the posterior tibial and peroneal veins, currently on Heparin, we will have to switch to Xarelto/coumadine before D/C  4. Coronary artery disease-continue aspirin and statin.  5. Ischemic cardiomyopathy-hold ACE and beta blocker due to hypotension.  6. Confusion-significantly improved.   Yoseph Haile, H 11/29/2013, 8:12 AM    

## 2013-11-29 NOTE — Progress Notes (Signed)
NUTRITION FOLLOW UP  Intervention:   1.  General healthful diet; encourage intake of foods and beverages as able.  RD to follow and assess for nutritional adequacy.   Nutrition Dx:   Inadequate oral intake, improving.   Monitor:   1.  Food/Beverage; pt meeting >/=90% estimated needs with tolerance.  Likely met with adequate intake. 2.  Wt/wt change; monitor trends. Overall stable  Assessment:   Patient with PMH of CAD, ischemic cardiomyopathy and chronic systolic CHF who was seen at [facility] ED due to complaints of SOB, and worsening swelling of his lower legs. He denied having any chest pain or fever or chills. He was evaluated in the ED and was found to have a +Troponin level of 3.5, and changes consisted with CHF and possible pneumonia on chest X-ray.  Pt reported decreased appetite with poor intake x2 days PTA.  Pt prepping for cath lab at time of visit.  PO intake has largely been 100% of meals.  Currently NPO for procedure.  No interventions identified.  Will continue to follow and ensure adequate intake.   Height: Ht Readings from Last 1 Encounters:  11/24/13 5' 9"  (1.753 m)    Weight Status:   Wt Readings from Last 1 Encounters:  11/29/13 194 lb 7.1 oz (88.2 kg)    Re-estimated needs:  Kcal: 1950-2150  Protein: 95-105 gm  Fluid: per MD  Skin: intact  Diet Order: NPO   Intake/Output Summary (Last 24 hours) at 11/29/13 1135 Last data filed at 11/29/13 0900  Gross per 24 hour  Intake 479.33 ml  Output    660 ml  Net -180.67 ml   Last BM: 2/22   Labs:   Recent Labs Lab 11/26/13 0317 11/27/13 0341 11/29/13 0312  NA 144 141 140  K 3.6* 4.3 3.9  CL 109 104 102  CO2 22 25 25   BUN 41* 31* 29*  CREATININE 1.07 0.94 1.01  CALCIUM 8.7 9.3 9.0  GLUCOSE 109* 109* 110*    CBG (last 3)  No results found for this basename: GLUCAP,  in the last 72 hours  Scheduled Meds: . aspirin EC  325 mg Oral Daily  . atorvastatin  40 mg Oral q1800  . clopidogrel  75 mg  Oral Q breakfast  . diphenhydrAMINE  25 mg Intravenous Pre-Cath  . furosemide  20 mg Intravenous Daily  . levofloxacin  500 mg Oral Daily  . levothyroxine  262 mcg Oral QAC breakfast  . methylPREDNISolone (SOLU-MEDROL) injection  125 mg Intravenous Pre-Cath    Continuous Infusions: . sodium chloride 10 mL/hr at 11/28/13 0540  . heparin 1,800 Units/hr (11/29/13 0900)    Brynda Greathouse, MS RD LDN Clinical Inpatient Dietitian Pager: [phone] Weekend/After hours pager: [phone]

## 2013-11-29 NOTE — Progress Notes (Signed)
James [name] - Stepdown / ICU Progress Note  SAAGAR TORTORELLA ACZ:[id number] DOB: January 16, 1935 DOA: 11/24/2013 PCP: [name], DO  Brief narrative: 79 year old male with history of CAD status post CABG as well as congestive heart failure, grade [name] diastolic and systolic CHF with a decreased ejection fraction of 40% who was transferred from [facility] emergency room after presenting there on 2/20 with complaints of shortness of breath and lower extremity swelling x2 days. He was found to have a troponin of 3.5 and chest x-ray noted pneumonia and congestive heart failure so patient was transferred to the hospitalist service and placed in the step down unit. Patient's cardiologist is Dr. Percival Spanish of [facility] heart care and was consulted.   Attempts at diuresing were met with hypotension;  The patient's blood pressure dropped as low as 50s. Critical care was consulted initially for possible dopamine involvement, however patient did respond to gentle fluid boluses. As troponins were trending down, pneumonia treated, and blood pressure stabilized, patient's mentation improved. He is felt to be stable and plans are for cardiac catheterization on 2/24.   Assessment/Plan:  Acute respiratory failure with hypoxia :   A) Ischemic CM / Acute on chronic systolic heart failure   B) Left upper lobe pneumonia v/s pulmonary infarct  -had recurrent hypotension 2/23 that required fluid boluses  -Lasix decreased to 20 mg daily -cont to hold BB and ACE I 2/2 recent hypotension -recently had OP meds adjusted due to hypotension -PCT normal and CXR findings on left very fluffy and could be edema but given new finding DVT need to exclude PE by CTA Chest as this could be a pulmonary infarct -defer CTA Chest since also to get dye load from cath 2/25 -stop abx and follow course   DVT of right lower extremity  -new finding this admission -already on IV Heparin for presumed cardiac ischemia- will continue post  cath -have asked CM to clarify co pay of NOAC  CAD/NSTEMI  -per Cards -cath for 2/25  Hypotension -recurrent and seems r/t meds not sepsis  CKD  stage II -follow closely post cath - may require brief IVFs to clear dye load - delay CTA of chest to r/o PE / consider VQ instead   HYPERLIPIDEMIA  Hx of Essential hypertension, benign -BP soft - usual meds on hold  Hypothyroidism -cont Synthroid  Acute delirium -resolving  DVT prophylaxis: IV heparin Code Status: Full Family Communication: No family at bedside Disposition Plan/Expected LOS: Stepdown  Consultants: Cardiology  Procedures: 2-D echocardiogram pending  Lower extremity venous duplex preliminary results consistent with right lower extremity DVT involving posterior tibial and peroneal veins  Antibiotics: Levaquin 2/22 >>>2/25  HPI/Subjective: Patient alert and sitting on the side of the bed. Primarily concerned over right lower extremity swelling. No chest pain or shortness of breath at this time. No complaints of dizziness.  Objective: Blood pressure 107/57, pulse 91, temperature 97.6 F (36.4 C), temperature source Oral, resp. rate 23, height _0  ([name] m), weight 194 lb 7.[name] oz (88.2 kg), SpO2 98.00%.  Intake/Output Summary (Last 24 hours) at 11/29/13 1256 Last data filed at 11/29/13 0900  Gross per 24 hour  Intake 479.33 ml  Output    660 ml  Net -180.67 ml   Exam: General: No acute respiratory distress Lungs: Clear to auscultation bilaterally without wheezes or crackles,  2L Cardiovascular: Regular rate and rhythm without murmur gallop or rub normal S1 and S2, unilateral right lower extremity peripheral edema 3+  Abdomen:  Nontender, nondistended, soft, bowel sounds positive, no rebound, no ascites, no appreciable mass Musculoskeletal: No significant cyanosis, clubbing of bilateral lower extremities Neurological: Alert and oriented x 3, moves all extremities x 4 without focal neurological deficits,  CN 2-12 intact  Scheduled Meds:  Scheduled Meds: . aspirin EC  325 mg Oral Daily  . atorvastatin  40 mg Oral q1800  . clopidogrel  75 mg Oral Q breakfast  . diphenhydrAMINE  25 mg Intravenous Pre-Cath  . furosemide  20 mg Intravenous Daily  . levofloxacin  500 mg Oral Daily  . levothyroxine  262 mcg Oral QAC breakfast  . methylPREDNISolone (SOLU-MEDROL) injection  125 mg Intravenous Pre-Cath   Continuous Infusions: . sodium chloride 10 mL/hr at 11/28/13 0540  . heparin [name],800 Units/hr (11/29/13 0900)    Data Reviewed: Basic Metabolic Panel:  Recent Labs Lab 11/24/13 2300 11/25/13 0530 11/26/13 0317 11/27/13 0341 11/29/13 0312  NA 146 146 144 141 140  K 4.2 4.3 3.6* 4.3 3.9  CL 107 110 109 104 102  CO2 _0 GLUCOSE 120* 112* 109* 109* 110*  BUN 33* 34* 41* 31* 29*  CREATININE [name].05 [name].04 [name].07 0.94 [name].01  CALCIUM 9.6 9.6 8.7 9.3 9.0   Liver Function Tests:  Recent Labs Lab 11/24/13 2300  AST 19  ALT 12  ALKPHOS 81  BILITOT 0.9  PROT 6.4  ALBUMIN 3.[name]*   CBC:  Recent Labs Lab 11/24/13 2300 11/26/13 0317 11/27/13 0341 11/28/13 0245 11/29/13 0312  WBC 10.2 10.2 10.2 9.8 10.[name]  NEUTROABS 8.3*  --   --   --   --   HGB 12.6* 11.[name]* 12.0* 11.7* 12.0*  HCT 38.2* 34.4* 35.8* 35.0* 36.3*  MCV 90.5 91.2 89.9 88.2 88.[name]  PLT 260 206 217 204 219   Cardiac Enzymes:  Recent Labs Lab 11/24/13 2246 11/25/13 0530 11/25/13 1019  TROPONINI 2.39* [name].85* [name].49*    Recent Results (from the past 240 hour(s))  MRSA PCR SCREENING     Status: None   Collection Time    11/24/13 10:43 PM      Result Value Ref Range Status   MRSA by PCR NEGATIVE  NEGATIVE Final   Comment:            The GeneXpert MRSA Assay (FDA     approved for NASAL specimens     only), is one component of a     comprehensive MRSA colonization     surveillance program. It is not     intended to diagnose MRSA     infection nor to guide or     monitor treatment for     MRSA infections.      Studies:  Recent x-ray studies have been reviewed in detail by the Attending Physician  Time spent : 42mns     Allison Ellis, ANP [facility] Office  [phone] [phone] **If unable to reach the above provider after paging please contact the [email or web address] [phone] On-Call/Text Page:      [email or web address]      password [email or web address]  If 7PM-7AM, please contact night-coverage [email or web address] Password [email or web address] 11/29/2013, 12:56 PM   LOS: 5 days   I have personally examined this patient and reviewed the entire database. I have reviewed the above note, made any necessary editorial changes, and agree with its content.  JCherene Altes MD [facility]

## 2013-11-29 NOTE — Progress Notes (Addendum)
ANTICOAGULATION CONSULT NOTE   Pharmacy Consult for Heparin  Indication: chest pain/ACS, DVT  Allergies  Allergen Reactions  . Celecoxib     Unknown   . Codeine     Unknown   . Iohexol     Unknown     Patient Measurements: Height: 5\' 9"  (175.3 cm) Weight: 194 lb 7.1 oz (88.2 kg) IBW/kg (Calculated) : 70.7  Vital Signs: Temp: 98.6 F (37 C) (02/25 0717) Temp src: Oral (02/25 0717) BP: 107/57 mmHg (02/25 0717) Pulse Rate: 93 (02/25 0717)   Estimated Creatinine Clearance: 67.3 ml/min (by C-G formula based on Cr of 1.01).   Assessment: [name] for N[place] and now noted with new RLE DVT. Heparin noted at goal (HL= 0.38) on 1800 units/hr. Patient noted for cath today.   Goal of Therapy:  Heparin level 0.3-0.7 units/ml Monitor platelets by anticoagulation protocol: Yes   Plan:  -Continue heparin drip at 1800 units/hr -Will follow anticoagulation plans post cath   Harland Germanndrew Bobie Kistler, Pharm D 11/29/2013 8:24 AM   Addendum -Patient to start Xarelto for VTE post cath -SCr= 1.01, CrCl ~ 70  Plan -Will start Xarelto 15mg  bid after cath for 21 days then 20mg  mg po daily ( -Will follow patient s/p cath for progress  Harland Germanndrew Zen Cedillos, Pharm D 11/29/2013 12:27 PM

## 2013-11-29 NOTE — Plan of Care (Signed)
Venous duplex positive for RLE DVT in the posterior tibial and peroneal veins. Already on IV Heparin for cardiac ischemia. Will make sure pharmacy aware needs to continue heparin post cath for DVT.  Junious SilkAllison Ellis, ANP

## 2013-11-29 NOTE — CV Procedure (Signed)
James James [name] R James [name] is a 79 y.o. male    [id number]  [id number] LOCATION:  FACILITY: [facility]  PHYSICIAN: James Biharihomas A. Georgeanne Frankland, MD, [facility] 14-Aug-1935   DATE OF PROCEDURE:  11/29/2013    CARDIAC CATHETERIZATION     HISTORY:  James James [name] is a 79 year old male with a history of CAD, documented ischemic heart myopathy with apical aneurysm who is status post CABG x4 revascularization surgery in 1999 who has undergone stenting of the vein graft to the right coronary artery and has documented occluded graft to a diagonal vessel. Last catheterization he had significant calcified and told the apical aneurysm. [facility] with increasing shortness of breath, CHF, and possible pneumonia. He has ruled   in for a non-ST segment elevation MI is now referred for cardiac catheterization.   PROCEDURE:  The patient was brought to the second floor [place] Cardiac cath lab in the postabsorptive state. He appeared very frail and was premedicated with Versed 1 mg. His right groin was prepped and shaved in usual sterile fashion. Xylocaine 1% was used for local anesthesia. A 5 French sheath was inserted into the right femoral artery. Diagnostic catheterizatiion was done with 5 [place] LF4, FR4, LIMA and pigtail catheters. Left ventricular pressures were recorded with the pigtail catheter but due to the markedly elevated left ventricle end-diastolic pressure of 44 mm left ventriculography was not performed. Patient was given Lasix 40 mm grams IV as to insert a Foley catheter in the holding area. He left the catheterization laboratory pain-free with stable hemodynamics.  HEMODYNAMICS:   Central Aorta: 110/68   Left Ventricle: 110/28/44  ANGIOGRAPHY:  1. Left main: Mild ostial taper of 30% and bifurcated into LAD and left circumflex vessel.  2. LAD: Calcification with documented an occlusion after the several septal perforating arteries. 3. Left circumflex:  Calcified vessel which gave rise to a very high marginal branch, followed by 50% stenosis before the second marginal branch. The median groove circumflex had diffuse narrowing of 70-80%   4. Right coronary artery: vessel with 6080% proximal stenosis, diffuse 90% mid stenosis, an occlusion distally. 5.LIMA - LAD: Widely patent it anastomosed into the mid LAD 6. SVG - circumflex marginal vessel: 90% focal stenosis in the proximal third of the vessel followed by a long segment of tubular narrowing of 80% and distal 80% focal stenosis 7.  SVG - diagonal: Old occlusion at the origin 8.SVG - RCA: 50% ostial eccentric narrowing of 60-70% eccentric smooth narrowing proximal to the mid distal stented graft segment. The vein graft anastomosed into the mid PDA   The pigtail catheter was advanced into the left ventricle. On fluoroscopy there was a denselr calcify apical anterolateral ventricular aneurysm with apparent dyskinesis. Due to the marked elevation of left ventricular end-diastolic pressure, left ventriculography was not performed.   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  Significant native coronary obstructive disease with total occlusion of the mid LAD after several septal perforating artery; diffusely diseased calcified left circumflex vessel with 50% proximal stenosis and diffuse 70-80% mid stenoses; and diffusely diseased RCA with 60 -80% proximal followed by a 90% mid and subtotally occluded distal stenosis   Patent LIMA graft supplying the mid LAD are occluded graft which most likely supplied a diagonal vessel   Old occlusion of the SVG to diagonal  SVG to the circumflex marginal with new 90% focal stenosis in the proximal third of  the vessel followed by diffuse tubular stenoses of 80% extending into the mid portion of the vein graft and focal 80% distal stenosis not significantly benefited from intracoronary nitroglycerin  administration  SVG to PDA with 50% proximal stenosis, 60-70% smooth eccentric mid stenosis proximal to the previously placed patent stents in the midportion of the vein graft with the graft anastomosing into the mid PDA.  Cheyne-Stokes respirations noted during the catheterization procedure with significant restless legs.  40 mg IV Lasix administered and Foley catheter placed after the procedure   RECOMMENDATION:   James. James [name] grafts are now 79 years old. He has diffuse disease vein graft supplying the circumflex marginal vessel and also has moderate stenosis in the vein graft supplying the right her artery. He does have evidence for ischemic heart myopathy and significant calcified apical aneurysm with dyskinetic segment. LVEDP was markedly elevated today it is prolonged circulatory time undoubtedly contributed to his Cheyne-Stokes respiratory pattern. Angiographic findings will be reviewed with colleagues.  James Bihari, MD, [facility] 11/29/2013 4:42 PM

## 2013-11-29 NOTE — Interval H&P Note (Signed)
Cath Lab Visit (complete for each Cath Lab visit)  Clinical Evaluation Leading to the Procedure:   ACS: yes  Non-ACS:    Anginal Classification: CCS IV  Anti-ischemic medical therapy: Maximal Therapy (2 or more classes of medications)  Non-Invasive Test Results: No non-invasive testing performed  Prior CABG: Previous CABG      History and Physical Interval Note:  11/29/2013 3:21 PM  James [name]  has presented today for surgery, with the diagnosis of cp  The various methods of treatment have been discussed with the patient and family. After consideration of risks, benefits and other options for treatment, the patient has consented to  Procedure(s): LEFT HEART CATHETERIZATION WITH CORONARY/GRAFT ANGIOGRAM (N/A) as a surgical intervention .  The patient's history has been reviewed, patient examined, no change in status, stable for surgery.  I have reviewed the patient's chart and labs.  Questions were answered to the patient's satisfaction.     KELLY,THOMAS A

## 2013-11-30 DIAGNOSIS — N182 Chronic kidney disease, stage 2 (mild): Secondary | ICD-10-CM

## 2013-11-30 DIAGNOSIS — I5022 Chronic systolic (congestive) heart failure: Secondary | ICD-10-CM

## 2013-11-30 DIAGNOSIS — R404 Transient alteration of awareness: Secondary | ICD-10-CM

## 2013-11-30 LAB — BASIC METABOLIC PANEL
BUN: 26 mg/dL — AB (ref 6–23)
CHLORIDE: 102 meq/L (ref 96–112)
CO2: 25 meq/L (ref 19–32)
Calcium: 9.4 mg/dL (ref 8.4–10.5)
Creatinine, Ser: 0.99 mg/dL (ref 0.50–1.35)
GFR calc Af Amer: 89 mL/min — ABNORMAL LOW (ref >90)
GFR calc non Af Amer: 77 mL/min — ABNORMAL LOW (ref >90)
Glucose, Bld: 123 mg/dL — ABNORMAL HIGH (ref 70–99)
Potassium: 4.4 mEq/L (ref 3.7–5.3)
Sodium: 142 mEq/L (ref 137–147)

## 2013-11-30 LAB — HEPARIN LEVEL (UNFRACTIONATED)
Heparin Unfractionated: <0.1 IU/mL — ABNORMAL LOW (ref 0.30–0.70)
Heparin Unfractionated: 0.29 IU/mL — ABNORMAL LOW (ref 0.30–0.70)
Heparin Unfractionated: 0.42 IU/mL (ref 0.30–0.70)

## 2013-11-30 LAB — CBC
HEMATOCRIT: 37.8 % — AB (ref 39.0–52.0)
Hemoglobin: 12.5 g/dL — ABNORMAL LOW (ref 13.0–17.0)
MCH: 29.3 pg (ref 26.0–34.0)
MCHC: 33.1 g/dL (ref 30.0–36.0)
MCV: 88.5 fL (ref 78.0–100.0)
PLATELETS: 220 10*3/uL (ref 150–400)
RBC: 4.27 MIL/uL (ref 4.22–5.81)
RDW: 13.5 % (ref 11.5–15.5)
WBC: 9 10*3/uL (ref 4.0–10.5)

## 2013-11-30 MED ORDER — FUROSEMIDE 10 MG/ML IJ SOLN
20.0000 mg | Freq: Four times a day (QID) | INTRAMUSCULAR | Status: DC
  Administered 2013-11-30 – 2013-12-02 (×10): 20 mg via INTRAVENOUS
  Filled 2013-11-30 (×8): qty 2

## 2013-11-30 MED ORDER — FUROSEMIDE 10 MG/ML IJ SOLN
INTRAMUSCULAR | Status: AC
  Filled 2013-11-30: qty 4

## 2013-11-30 NOTE — Progress Notes (Signed)
Subjective:   AAO x 3; Denies CP or dyspnea, no right groin or RLE pain or numbness.  Marland Kitchen. aspirin EC  81 mg Oral Daily  . atorvastatin  40 mg Oral q1800  . clopidogrel  75 mg Oral Q breakfast  . furosemide  20 mg Intravenous Daily  . levothyroxine  262 mcg Oral QAC breakfast   . sodium chloride 30 mL/hr at 11/29/13 1742  . heparin 1,700 Units/hr (11/30/13 0519)   Objective:  Filed Vitals:   11/29/13 2000 11/29/13 2341 11/30/13 0000 11/30/13 0400  BP: 97/60  100/65 102/67  Pulse: 78  83 80  Temp: 97.1 F (36.2 C) 97.6 F (36.4 C)  97.6 F (36.4 C)  TempSrc: Oral Oral  Oral  Resp: 23  23 12   Height:      Weight:    197 lb 8.5 oz (89.6 kg)  SpO2: 93%  93% 95%    Intake/Output from previous day:  Intake/Output Summary (Last 24 hours) at 11/30/13 0754 Last data filed at 11/30/13 0000  Gross per 24 hour  Intake    239 ml  Output    500 ml  Net   -261 ml    Physical Exam: Physical exam: Well-developed well-nourished in no acute distress.  Skin is warm and dry.  HEENT is normal.  Neck is supple.  Chest with basilar crackles up to the half chest Cardiovascular exam is regular rate and rhythm.  Abdominal exam nontender or distended. No masses palpated. 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  Significant native coronary obstructive disease with total occlusion of the mid LAD after several septal perforating artery; diffusely diseased calcified left circumflex vessel with  50% proximal stenosis and diffuse 70-80% mid stenoses; and diffusely diseased RCA with 60 -80% proximal followed by a 90% mid and subtotally occluded distal stenosis  Patent LIMA graft supplying the mid LAD are occluded graft which most likely supplied a diagonal vessel  Old occlusion of the SVG to diagonal  SVG to the circumflex marginal with new 90% focal stenosis in the proximal third of the vessel followed by diffuse tubular stenoses of 80% extending into the mid portion of the vein graft and focal 80% distal stenosis not significantly benefited from intracoronary nitroglycerin administration  SVG to PDA with 50% proximal stenosis, 60-70% smooth eccentric mid stenosis proximal to the previously placed patent stents in the midportion of the vein graft with the graft anastomosing into the mid PDA.  Cheyne-Stokes respirations noted during the catheterization procedure with significant restless legs.  40 mg IV Lasix administered and Foley catheter placed after the procedure   RECOMMENDATION:  James [name]'s grafts are now 79 years old. He has diffuse disease vein graft supplying the circumflex marginal vessel and also has moderate stenosis in the vein graft supplying the right her artery. He does have evidence for ischemic heart myopathy and significant calcified apical  aneurysm with dyskinetic segment. LVEDP was markedly elevated today it is prolonged circulatory time undoubtedly contributed to his Cheyne-Stokes respiratory pattern. Angiographic findings will be reviewed with colleagues.    Assessment/Plan:   1. NSTEMI - the patient's cath yesterday showed diffuse disease vein graft supplying the circumflex marginal vessel and also has moderate stenosis in the vein graft supplying the right her artery. He does have evidence for ischemic heart myopathy and significant calcified apical aneurysm with dyskinetic segment. LVEDP was markedly elevated today it is prolonged circulatory time undoubtedly  contributed to his Cheyne-Stokes respiratory pattern. Angiographic findings will be reviewed with colleagues. We will wait for the interventional decision.   2. Acute on chronic systolic congestive heart failure - echo from 2011 LVEF 40-45% with akinesis of the mid-distal lateral and apical myocardium),  Cath showed severely elevated LVEDP, the patient is fluid overloaded on physical exam - we will increase lasix to 20 mg iv q6h as he has hypotension.  3. Acute right lower extremity DVT - in the posterior tibial and peroneal veins, currently on Heparin, we will have to switch to Xarelto/coumadine before D/C  4. Coronary artery disease-continue aspirin and statin.  5. Ischemic cardiomyopathy-hold ACE and beta blocker due to hypotension.  6. Confusion-significantly improved.   James [name], H 11/30/2013, 7:54 AM

## 2013-11-30 NOTE — Progress Notes (Signed)
ANTICOAGULATION CONSULT NOTE - Follow Up Consult  Pharmacy Consult for heparin Indication: DVT and NSTEMI  Labs:  Recent Labs  11/28/13 0245 11/28/13 0414 11/29/13 0312 11/30/13 0231 11/30/13 0928 11/30/13 2226  HGB 11.7*  --  12.0* 12.5*  --   --   HCT 35.0*  --  36.3* 37.8*  --   --   PLT 204  --  219 220  --   --   LABPROT  --  15.5*  --   --   --   --   INR  --  1.26  --   --   --   --   HEPARINUNFRC 0.43  --  0.34 <0.10* 0.29* 0.42  CREATININE  --   --  1.01 0.99  --   --     Assessment/Plan:  79yo male now therapeutic on heparin after rate increase.  Will continue gtt at current rate and confirm stable with am labs.  Vernard GamblesVeronda Isac Lincks, PharmD, BCPS  11/30/2013,11:59 PM

## 2013-11-30 NOTE — Progress Notes (Signed)
James [name] - Stepdown / ICU Progress Note  James [name]:[id number] DOB: May 05, 1935 DOA: 11/24/2013 PCP: James Graves, DO  Brief narrative: 79 year old male with history of CAD status post CABG as well as congestive heart failure, grade [name] diastolic and systolic CHF with a decreased ejection fraction of 40% who was transferred from [facility] emergency room after presenting there on 2/20 with complaints of shortness of breath and lower extremity swelling x2 days. He was found to have a troponin of 3.5 and chest x-ray noted pneumonia and congestive heart failure so patient was transferred to the hospitalist service and placed in the step down unit. Patient's cardiologist is James [name] of [facility] heart care and was consulted.   Attempts at diuresing were met with hypotension;  The patient's blood pressure dropped as low as 50s. Critical care was consulted initially for possible dopamine involvement, however patient did respond to gentle fluid boluses. As troponins were trending down, pneumonia treated, and blood pressure stabilized, patient's mentation improved. He is felt to be stable and plans are for cardiac catheterization on 2/24.   Assessment/Plan:  Acute respiratory failure with hypoxia :   A) Ischemic CM / Acute on chronic systolic heart failure   B) Left upper lobe pneumonia v/s pulmonary infarct  -had recurrent hypotension 2/23 that required fluid boluses  -Lasix decreased to 20 mg daily 2/25 but exam today c/w volume overload so Cards has increased Lasix to IV q 6 hrs -cont to hold BB and ACE I 2/2 recent hypotension -recently had OP meds adjusted due to hypotension -PCT normal and CXR findings on left very fluffy and could be edema but given new finding DVT need to exclude PE by CTA Chest as this could be a pulmonary infarct -defer CTA Chest since also to get dye load from cath 2/25 -stop abx and follow course   DVT of right lower extremity  -new finding this  admission -already on IV Heparin for presumed cardiac ischemia- will continue post cath -have asked CM to clarify co pay of NOAC  CAD/NSTEMI  -per Cards -cath 2/25:diffuse dz circumflex and stenosis vein graft right-interventional decision pending -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  Cheyne-Stokes resp pattern -observed during cath while sedated -? If actually c/w OSA sx's  Hypotension -recurrent and seems r/t meds not sepsis  CKD  stage II -renal fnx stable post cath  HYPERLIPIDEMIA  Hx of Essential hypertension, benign -BP soft - usual meds on hold  Hypothyroidism -cont Synthroid  Acute delirium -resolving  DVT prophylaxis: IV heparin Code Status: Full Family Communication: No family at bedside yesterday when rounding- just prior to end of my shift on 2/25 was called to bedside to talk with family (Cousin)- LENGTHY d/w her about status/plans Disposition Plan/Expected LOS: Stepdown  Consultants: Cardiology  Procedures: 2-D echocardiogram pending  Lower extremity venous duplex preliminary results consistent with right lower extremity DVT involving posterior tibial and peroneal veins  Antibiotics: Levaquin 2/22 >>>2/25  HPI/Subjective: Awake and hungry. No complaints  Objective: Blood pressure 95/56, pulse 91, temperature 97.4 F (36.3 C), temperature source Oral, resp. rate 19, height _0  ([name] m), weight 197 lb 8.5 oz (89.6 kg), SpO2 97.00%.  Intake/Output Summary (Last 24 hours) at 11/30/13 0951 Last data filed at 11/30/13 0800  Gross per 24 hour  Intake    503 ml  Output   1000 ml  Net   -497 ml   Exam: General: No acute respiratory distress Lungs: Clear  to auscultation bilaterally without wheezes or crackles,  2L Cardiovascular: Regular rate and rhythm without murmur gallop or rub normal S1 and S2, unilateral right lower extremity peripheral edema 3+  Abdomen: Nontender, nondistended, soft, bowel sounds positive, no  rebound, no ascites, no appreciable mass Musculoskeletal: No significant cyanosis, clubbing of bilateral lower extremities-dressing over R groin post cath Neurological: Alert and oriented x 3, moves all extremities x 4 without focal neurological deficits, CN 2-12 intact  Scheduled Meds:  Scheduled Meds: . aspirin EC  81 mg Oral Daily  . atorvastatin  40 mg Oral q1800  . clopidogrel  75 mg Oral Q breakfast  . furosemide  20 mg Intravenous 4 times per day  . levothyroxine  262 mcg Oral QAC breakfast   Continuous Infusions: . sodium chloride 30 mL/hr at 11/29/13 1742  . heparin [name],700 Units/hr (11/30/13 0519)    Data Reviewed: Basic Metabolic Panel:  Recent Labs Lab 11/25/13 0530 11/26/13 0317 11/27/13 0341 11/29/13 0312 11/30/13 0231  NA 146 144 141 140 142  K 4.3 3.6* 4.3 3.9 4.4  CL 110 109 104 102 102  CO2 _0 GLUCOSE 112* 109* 109* 110* 123*  BUN 34* 41* 31* 29* 26*  CREATININE [name].04 [name].07 0.94 [name].01 0.99  CALCIUM 9.6 8.7 9.3 9.0 9.4   Liver Function Tests:  Recent Labs Lab 11/24/13 2300  AST 19  ALT 12  ALKPHOS 81  BILITOT 0.9  PROT 6.4  ALBUMIN 3.[name]*   CBC:  Recent Labs Lab 11/24/13 2300 11/26/13 0317 11/27/13 0341 11/28/13 0245 11/29/13 0312 11/30/13 0231  WBC 10.2 10.2 10.2 9.8 10.[name] 9.0  NEUTROABS 8.3*  --   --   --   --   --   HGB 12.6* 11.[name]* 12.0* 11.7* 12.0* 12.5*  HCT 38.2* 34.4* 35.8* 35.0* 36.3* 37.8*  MCV 90.5 91.2 89.9 88.2 88.[name] 88.5  PLT 260 206 217 204 219 220   Cardiac Enzymes:  Recent Labs Lab 11/24/13 2246 11/25/13 0530 11/25/13 1019  TROPONINI 2.39* [name].85* [name].49*    Recent Results (from the past 240 hour(s))  MRSA PCR SCREENING     Status: None   Collection Time    11/24/13 10:43 PM      Result Value Ref Range Status   MRSA by PCR NEGATIVE  NEGATIVE Final   Comment:            The GeneXpert MRSA Assay (FDA     approved for NASAL specimens     only), is one component of a     comprehensive MRSA colonization       surveillance program. It is not     intended to diagnose MRSA     infection nor to guide or     monitor treatment for     MRSA infections.     Studies:  Recent x-ray studies have been reviewed in detail by the Attending Physician  Time spent : 46mns     James [name], ANP [facility] Office  [phone] [phone] **If unable to reach the above provider after paging please contact the [email or web address] [phone] On-Call/Text Page:      [email or web address]      password [email or web address]  If 7PM-7AM, please contact night-coverage [email or web address] Password [email or web address]2/26/2015, 9:51 AM   LOS: 6 days    I have examined the patient, reviewed the chart and modified the above note which I agree with.   James Alberico,MD [phone]/26/2015, 4:18 PM

## 2013-11-30 NOTE — Progress Notes (Signed)
ANTICOAGULATION CONSULT NOTE   Pharmacy Consult for Heparin  Indication: chest pain/ACS, DVT  Allergies  Allergen Reactions  . Celecoxib     Unknown   . Codeine     Unknown   . Iohexol     Unknown     Patient Measurements: Height: 5\' 9"  (175.3 cm) Weight: 197 lb 8.5 oz (89.6 kg) IBW/kg (Calculated) : 70.7  Vital Signs: Temp: 97.6 F (36.4 C) (02/26 1200) Temp src: Oral (02/26 1200) BP: 97/54 mmHg (02/26 1200) Pulse Rate: 99 (02/26 1200)   Estimated Creatinine Clearance: 69.2 ml/min (by C-G formula based on Cr of 0.99).   Assessment: [name] tx from [facility] on heparin for NSTEMI and now noted with new RLE DVT. Pt went to cath lab today.    Heparin to be restarted 8 hr after sheath pulled. Sheath out 1730 start heparin at 0200 2/26.    Heparin level today just slightly subtherapeutic at 0.29.   CBC stable: Hgb 12.5, pltc 220. No bleeding issues noted.   Goal of Therapy:  Heparin level 0.3-0.7 units/ml Monitor platelets by anticoagulation protocol: Yes   Plan:  Increase heparin drip to 1850 units/hr Check heparin level in 8 hours (2230) Daily heparin levels, CBC while on heparin F/U bleeding complications  Alizabeth Antonio C. Saree Krogh, PharmD Clinical Pharmacist-Resident Pager: [phone] Pharmacy: [phone] 11/30/2013 2:16 PM

## 2013-12-01 ENCOUNTER — Encounter (HOSPITAL_COMMUNITY)
Admission: EM | Disposition: A | Discharge status: Skilled Nursing Facility | Payer: Self-pay | Source: Other Acute Inpatient Hospital | Attending: Internal Medicine

## 2013-12-01 DIAGNOSIS — J9600 Acute respiratory failure, unspecified whether with hypoxia or hypercapnia: Secondary | ICD-10-CM

## 2013-12-01 HISTORY — PX: PERCUTANEOUS STENT INTERVENTION: SHX5500

## 2013-12-01 LAB — CBC
HCT: 37.3 % — ABNORMAL LOW (ref 39.0–52.0)
Hemoglobin: 12.4 g/dL — ABNORMAL LOW (ref 13.0–17.0)
MCH: 29.5 pg (ref 26.0–34.0)
MCHC: 33.2 g/dL (ref 30.0–36.0)
MCV: 88.6 fL (ref 78.0–100.0)
Platelets: 233 10*3/uL (ref 150–400)
RBC: 4.21 MIL/uL — ABNORMAL LOW (ref 4.22–5.81)
RDW: 13.6 % (ref 11.5–15.5)
WBC: 12.5 10*3/uL — ABNORMAL HIGH (ref 4.0–10.5)

## 2013-12-01 LAB — BASIC METABOLIC PANEL
BUN: 34 mg/dL — AB (ref 6–23)
CALCIUM: 9.1 mg/dL (ref 8.4–10.5)
CO2: 26 mEq/L (ref 19–32)
CREATININE: 1.04 mg/dL (ref 0.50–1.35)
Chloride: 101 mEq/L (ref 96–112)
GFR, EST AFRICAN AMERICAN: 78 mL/min — AB (ref >90)
GFR, EST NON AFRICAN AMERICAN: 67 mL/min — AB (ref >90)
Glucose, Bld: 124 mg/dL — ABNORMAL HIGH (ref 70–99)
Potassium: 3.5 mEq/L — ABNORMAL LOW (ref 3.7–5.3)
Sodium: 141 mEq/L (ref 137–147)

## 2013-12-01 LAB — HEPARIN LEVEL (UNFRACTIONATED): HEPARIN UNFRACTIONATED: 0.37 [IU]/mL (ref 0.30–0.70)

## 2013-12-01 SURGERY — PERCUTANEOUS STENT INTERVENTION
Anesthesia: LOCAL

## 2013-12-01 MED ORDER — FUROSEMIDE 10 MG/ML IJ SOLN
INTRAMUSCULAR | Status: AC
Start: 2013-12-01 — End: 2013-12-01
  Filled 2013-12-01: qty 4

## 2013-12-01 MED ORDER — SODIUM CHLORIDE 0.9 % IJ SOLN
3.0000 mL | Freq: Two times a day (BID) | INTRAMUSCULAR | Status: DC
  Administered 2013-12-01 – 2013-12-05 (×4): 3 mL via INTRAVENOUS

## 2013-12-01 MED ORDER — SODIUM CHLORIDE 0.9 % IV SOLN: 250.0000 mL | INTRAVENOUS | Status: DC | PRN

## 2013-12-01 MED ORDER — SODIUM CHLORIDE 0.9 % IV SOLN
INTRAVENOUS | Status: DC
  Administered 2013-12-02: 06:00:00 via INTRAVENOUS

## 2013-12-01 MED ORDER — SODIUM CHLORIDE 0.9 % IJ SOLN
3.0000 mL | INTRAMUSCULAR | Status: DC | PRN
  Administered 2013-12-02: 3 mL via INTRAVENOUS

## 2013-12-01 MED ORDER — POTASSIUM CHLORIDE CRYS ER 20 MEQ PO TBCR
40.0000 meq | EXTENDED_RELEASE_TABLET | ORAL | Status: AC
  Administered 2013-12-01 (×2): 40 meq via ORAL
  Filled 2013-12-01 (×2): qty 2

## 2013-12-01 NOTE — Progress Notes (Signed)
ANTICOAGULATION CONSULT NOTE   Pharmacy Consult for Heparin  Indication: chest pain/ACS, DVT  Allergies  Allergen Reactions  . Celecoxib     Unknown   . Codeine     Unknown   . Iohexol     Unknown     Patient Measurements: Height: 5\' 9"  (175.3 cm) Weight: 196 lb 10.4 oz (89.2 kg) IBW/kg (Calculated) : 70.7  Vital Signs: Temp: 98 F (36.7 C) (02/27 0749) Temp src: Oral (02/27 0749) BP: 102/60 mmHg (02/27 0749) Pulse Rate: 96 (02/27 0000)   Estimated Creatinine Clearance: 65.7 ml/min (by C-G formula based on Cr of 1.04).   Assessment: [name] tx from [facility] on heparin for NSTEMI and now noted with new RLE DVT. Pt s/p cath with possible staged PCI. Heparin level is at goal (HL= 0.37) ang hg/hct/plt are stable.    Goal of Therapy:  Heparin level 0.3-0.7 units/ml Monitor platelets by anticoagulation protocol: Yes   Plan:  -No heparin changes needed -Daily heparin level and CBC -Will follow plans for cath  Harland Germanndrew Thailyn Khalid, Pharm D 12/01/2013 8:33 AM

## 2013-12-01 NOTE — Progress Notes (Signed)
James [name] - Stepdown / ICU Progress Note  James [name] QIW:[id number] DOB: Apr 22, 1935 DOA: 11/24/2013 PCP: James Graves, DO  Brief narrative: 79 year old male with history of CAD status post CABG as well as congestive heart failure, grade [name] diastolic and systolic CHF with a decreased ejection fraction of 40% who was transferred from [facility] emergency room after presenting there on 2/20 with complaints of shortness of breath and lower extremity swelling x2 days. He was found to have a troponin of 3.5 and chest x-ray noted pneumonia and congestive heart failure so patient was transferred to the hospitalist service and placed in the step down unit. Patient's cardiologist is James [name] of [facility] heart care and was consulted.   Attempts at diuresing were met with hypotension;  The patient's blood pressure dropped as low as 50s. Critical care was consulted initially for possible dopamine involvement, however patient did respond to gentle fluid boluses. As troponins were trending down, pneumonia treated, and blood pressure stabilized, patient's mentation improved. He is felt to be stable and plans are for cardiac catheterization on 2/24.   Assessment/Plan:  Acute respiratory failure with hypoxia :   A) Ischemic CM / Acute on chronic systolic heart failure   B) Left upper lobe pneumonia v/s pulmonary infarct  -had recurrent hypotension 2/23 that required fluid boluses -recently had OP meds adjusted due to hypotension- -Lasix decreased to 20 mg daily 2/25 but exam today c/w volume overload so Cards has increased Lasix  -cont to hold BB and ACE I 2/2 recent hypotension - antibiotics discontinued- follow for now- WBC slightly up today but no new symptoms  DVT of right lower extremity  -new finding this admission - Cont IV Heparin -have asked CM to clarify co pay of NOAC - CT of the chest will not change our management, therefore will defer it.   CAD/NSTEMI  -cath 2/25:diffuse dz  circumflex and stenosis vein graft righ - repeat cath once better diuresed -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  Cheyne-Stokes resp pattern -observed during cath while sedated -? If actually c/w OSA sx's  CKD  stage II -renal fnx stable post cath  HYPERLIPIDEMIA  Hx of Essential hypertension, benign -BP soft - usual meds on hold  Hypothyroidism -cont Synthroid  Acute delirium -resolved  B/l cyanosis - obtain ABI  DVT prophylaxis: IV heparin Code Status: Full Family Communication: No family at bedside yesterday when rounding- just prior to end of my shift on 2/25 was called to bedside to talk with family (Cousin)- LENGTHY d/w her about status/plans Disposition Plan/Expected LOS: Stepdown  Consultants: Cardiology  Procedures: 2-D echocardiogram pending  Lower extremity venous duplex preliminary results consistent with right lower extremity DVT involving posterior tibial and peroneal veins  Antibiotics: Levaquin 2/22 >>>2/25  HPI/Subjective: Awake and hungry. No complaints  Objective: Blood pressure 105/46, pulse 96, temperature 97.7 F (36.5 C), temperature source Oral, resp. rate 24, height 5' 9" ([name] m), weight 89.2 kg (196 lb 10.4 oz), SpO2 93.00%.  Intake/Output Summary (Last 24 hours) at 12/01/13 1522 Last data filed at 12/01/13 1400  Gross per 24 hour  Intake   1097 ml  Output   1575 ml  Net   -478 ml   Exam: General: No acute respiratory distress Lungs: Clear to auscultation bilaterally without wheezes or crackles,  2L Cardiovascular: Regular rate and rhythm without murmur gallop or rub normal S1 and S2, unilateral right lower extremity peripheral edema 3+ Abdomen: Nontender, nondistended, soft, bowel sounds positive, no  rebound, no ascites, no appreciable mass Extremities: No significant clubbing of bilateral lower extremities- b/l cyanotic toes noted (chronic per pt) Neurological: Alert and oriented x 3, moves all  extremities x 4 without focal neurological deficits, CN 2-12 intact  Scheduled Meds:  Scheduled Meds: . aspirin EC  81 mg Oral Daily  . atorvastatin  40 mg Oral q1800  . clopidogrel  75 mg Oral Q breakfast  . furosemide  20 mg Intravenous 4 times per day  . levothyroxine  262 mcg Oral QAC breakfast  . sodium chloride  3 mL Intravenous Q12H   Continuous Infusions: . sodium chloride 30 mL/hr at 11/29/13 1742  . [START ON 12/02/2013] sodium chloride    . heparin [name],850 Units/hr (12/01/13 0950)    Data Reviewed: Basic Metabolic Panel:  Recent Labs Lab 11/26/13 0317 11/27/13 0341 11/29/13 0312 11/30/13 0231 12/01/13 0225  NA 144 141 140 142 141  K 3.6* 4.3 3.9 4.4 3.5*  CL 109 104 102 102 101  CO2 _0 GLUCOSE 109* 109* 110* 123* 124*  BUN 41* 31* 29* 26* 34*  CREATININE [name].07 0.94 [name].01 0.99 [name].04  CALCIUM 8.7 9.3 9.0 9.4 9.[name]   Liver Function Tests:  Recent Labs Lab 11/24/13 2300  AST 19  ALT 12  ALKPHOS 81  BILITOT 0.9  PROT 6.4  ALBUMIN 3.[name]*   CBC:  Recent Labs Lab 11/24/13 2300  11/27/13 0341 11/28/13 0245 11/29/13 0312 11/30/13 0231 12/01/13 0225  WBC 10.2  < > 10.2 9.8 10.[name] 9.0 12.5*  NEUTROABS 8.3*  --   --   --   --   --   --   HGB 12.6*  < > 12.0* 11.7* 12.0* 12.5* 12.4*  HCT 38.2*  < > 35.8* 35.0* 36.3* 37.8* 37.3*  MCV 90.5  < > 89.9 88.2 88.[name] 88.5 88.6  PLT 260  < > 217 204 219 220 233  < > = values in this interval not displayed. Cardiac Enzymes:  Recent Labs Lab 11/24/13 2246 11/25/13 0530 11/25/13 1019  TROPONINI 2.39* [name].85* [name].49*    Recent Results (from the past 240 hour(s))  MRSA PCR SCREENING     Status: None   Collection Time    11/24/13 10:43 PM      Result Value Ref Range Status   MRSA by PCR NEGATIVE  NEGATIVE Final   Comment:            The GeneXpert MRSA Assay (FDA     approved for NASAL specimens     only), is one component of a     comprehensive MRSA colonization     surveillance program. It is not      intended to diagnose MRSA     infection nor to guide or     monitor treatment for     MRSA infections.     Studies:  Recent x-ray studies have been reviewed in detail by the Attending Physician  Time spent : 65mns    Kazimir Hartnett,MD [phone] 12/01/2013, 3:22 PM

## 2013-12-01 NOTE — Progress Notes (Addendum)
Subjective:   AAO x 3; Denies CP or dyspnea.   Marland Kitchen. aspirin EC  81 mg Oral Daily  . atorvastatin  40 mg Oral q1800  . clopidogrel  75 mg Oral Q breakfast  . furosemide  20 mg Intravenous 4 times per day  . levothyroxine  262 mcg Oral QAC breakfast  . potassium chloride  40 mEq Oral Q4H   . sodium chloride 30 mL/hr at 11/29/13 1742  . heparin 1,850 Units/hr (12/01/13 0950)   Objective:  Filed Vitals:   12/01/13 0000 12/01/13 0400 12/01/13 0447 12/01/13 0749  BP: 111/52 129/96  102/60  Pulse: 96     Temp:  98.2 F (36.8 C)  98 F (36.7 C)  TempSrc:  Oral  Oral  Resp: 24     Height:      Weight:   196 lb 10.4 oz (89.2 kg)   SpO2: 96%   97%    Intake/Output from previous day:  Intake/Output Summary (Last 24 hours) at 12/01/13 1023 Last data filed at 12/01/13 0800  Gross per 24 hour  Intake 1142.5 ml  Output   1550 ml  Net -407.5 ml    Physical Exam: Physical exam: Well-developed well-nourished in no acute distress.  Skin is warm and dry.  HEENT is normal.  Neck is supple.  Chest no crackles or wheezes Cardiovascular exam is regular rate and rhythm.  Abdominal exam nontender or distended. No masses palpated. 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  Significant native coronary obstructive disease with total occlusion of the mid LAD after several septal perforating artery; diffusely diseased calcified left circumflex vessel with 50% proximal stenosis and diffuse 70-80%  mid stenoses; and diffusely diseased RCA with 60 -80% proximal followed by a 90% mid and subtotally occluded distal stenosis  Patent LIMA graft supplying the mid LAD are occluded graft which most likely supplied a diagonal vessel  Old occlusion of the SVG to diagonal  SVG to the circumflex marginal with new 90% focal stenosis in the proximal third of the vessel followed by diffuse tubular stenoses of 80% extending into the mid portion of the vein graft and focal 80% distal stenosis not significantly benefited from intracoronary nitroglycerin administration  SVG to PDA with 50% proximal stenosis, 60-70% smooth eccentric mid stenosis proximal to the previously placed patent stents in the midportion of the vein graft with the graft anastomosing into the mid PDA.  Cheyne-Stokes respirations noted during the catheterization procedure with significant restless legs.  40 mg IV Lasix administered and Foley catheter placed after the procedure   RECOMMENDATION:  Mr. Henriette CombsJohnson's grafts are now 79 years old. He has diffuse disease vein graft supplying the circumflex marginal vessel and also has moderate stenosis in the vein graft supplying the right her artery. He does have evidence for ischemic heart myopathy and significant calcified apical aneurysm with dyskinetic segment. LVEDP was markedly elevated today  it is prolonged circulatory time undoubtedly contributed to his Cheyne-Stokes respiratory pattern. Angiographic findings will be reviewed with colleagues.    Assessment/Plan:   1. NSTEMI - the patient's cath yesterday showed diffuse disease vein graft supplying the circumflex marginal vessel and also has moderate stenosis in the vein graft supplying the right her artery. He does have evidence for ischemic heart myopathy and significant calcified apical aneurysm with dyskinetic segment. LVEDP was markedly elevated. Dr Tresa Endo discussed the case with Dr [place], and the decision was to perform intervention to the  SVG supplying OM. We will schedule for today.   2. Acute on chronic systolic congestive heart failure - echo from 2011 LVEF 40-45% with akinesis of the mid-distal lateral and apical myocardium),  Cath showed severely elevated LVEDP, the patient is fluid overloaded on physical exam - we increased lasix to 20 mg iv q6h yesterday as his BP is borderline, he diuresed -400 ml in the last 24 hours and feels mildly better.   3. Acute right lower extremity DVT - in the posterior tibial and peroneal veins, currently on Heparin, we will have to switch to Xarelto/coumadine before D/C  4. Coronary artery disease-continue aspirin and statin.  5. Ischemic cardiomyopathy-hold ACE and beta blocker due to hypotension.  6. Confusion-significantly improved.   [name], H 12/01/2013, 10:23 AM

## 2013-12-01 NOTE — Progress Notes (Signed)
Interventional cardiology.   I reviewed cardiac cath results/films and talked to the patient and family. He has high grade complex disease in the SVG to the OM. The native LCx is diffusely disease. PCI of the SVG is reasonable but at the time of cath LVEDP was 40 mm Hg. Since then patient has diuresed little and weight is still up. I think he needs further aggressive management of his CHF before proceeding with PCI. Will let him eat today and consider intervention next week if his condition improves. Discussed with Dr. Delton SeeNelson.  [name] MD, [facility] CenterFACC

## 2013-12-02 ENCOUNTER — Inpatient Hospital Stay (HOSPITAL_COMMUNITY): Payer: Medicare Other

## 2013-12-02 ENCOUNTER — Encounter (HOSPITAL_COMMUNITY): Payer: Self-pay | Admitting: *Deleted

## 2013-12-02 LAB — CBC
HEMATOCRIT: 36 % — AB (ref 39.0–52.0)
Hemoglobin: 12.2 g/dL — ABNORMAL LOW (ref 13.0–17.0)
MCH: 29.8 pg (ref 26.0–34.0)
MCHC: 33.9 g/dL (ref 30.0–36.0)
MCV: 88 fL (ref 78.0–100.0)
PLATELETS: 216 10*3/uL (ref 150–400)
RBC: 4.09 MIL/uL — ABNORMAL LOW (ref 4.22–5.81)
RDW: 13.6 % (ref 11.5–15.5)
WBC: 11.8 10*3/uL — ABNORMAL HIGH (ref 4.0–10.5)

## 2013-12-02 LAB — BASIC METABOLIC PANEL
BUN: 35 mg/dL — AB (ref 6–23)
CHLORIDE: 96 meq/L (ref 96–112)
CO2: 23 meq/L (ref 19–32)
Calcium: 9 mg/dL (ref 8.4–10.5)
Creatinine, Ser: 1 mg/dL (ref 0.50–1.35)
GFR calc Af Amer: 82 mL/min — ABNORMAL LOW (ref >90)
GFR calc non Af Amer: 70 mL/min — ABNORMAL LOW (ref >90)
Glucose, Bld: 120 mg/dL — ABNORMAL HIGH (ref 70–99)
Potassium: 4.1 mEq/L (ref 3.7–5.3)
Sodium: 136 mEq/L — ABNORMAL LOW (ref 137–147)

## 2013-12-02 LAB — HEPARIN LEVEL (UNFRACTIONATED): Heparin Unfractionated: 0.32 IU/mL (ref 0.30–0.70)

## 2013-12-02 MED ORDER — SODIUM CHLORIDE 0.9 % IJ SOLN
10.0000 mL | Freq: Two times a day (BID) | INTRAMUSCULAR | Status: DC
  Administered 2013-12-02 – 2013-12-06 (×7): 10 mL

## 2013-12-02 MED ORDER — FUROSEMIDE 10 MG/ML IJ SOLN
60.0000 mg | Freq: Three times a day (TID) | INTRAMUSCULAR | Status: DC
  Administered 2013-12-02 – 2013-12-03 (×3): 60 mg via INTRAVENOUS
  Filled 2013-12-02 (×4): qty 6

## 2013-12-02 MED ORDER — POTASSIUM CHLORIDE CRYS ER 20 MEQ PO TBCR
40.0000 meq | EXTENDED_RELEASE_TABLET | Freq: Once | ORAL | Status: AC
  Administered 2013-12-02: 40 meq via ORAL
  Filled 2013-12-02: qty 2

## 2013-12-02 MED ORDER — POTASSIUM CHLORIDE CRYS ER 20 MEQ PO TBCR
40.0000 meq | EXTENDED_RELEASE_TABLET | Freq: Two times a day (BID) | ORAL | Status: AC
  Administered 2013-12-02 (×2): 40 meq via ORAL
  Filled 2013-12-02: qty 4
  Filled 2013-12-02 (×3): qty 2

## 2013-12-02 MED ORDER — SODIUM CHLORIDE 0.9 % IJ SOLN
10.0000 mL | INTRAMUSCULAR | Status: DC | PRN
  Administered 2013-12-03 – 2013-12-08 (×4): 10 mL
  Administered 2013-12-09: 20 mL
  Administered 2013-12-10: 10 mL

## 2013-12-02 NOTE — Progress Notes (Signed)
[name]  James [name] EXB:[id number] DOB: 12-18-1934 DOA: 11/24/2013 PCP: [name], DO  Brief narrative: 79 year old male with history of CAD status post CABG as well as congestive heart failure, grade 1 diastolic and systolic CHF with a decreased ejection fraction of 40% who was transferred from [facility] emergency room after presenting there on 2/20 with complaints of shortness of breath and lower extremity swelling x2 days. He was found to have a troponin of 3.5 and chest x-ray noted pneumonia and congestive heart failure so patient was transferred to the hospitalist service and placed in the step down unit. Patient's cardiologist is Dr. Percival Spanish of [facility] and was consulted.   Attempts at diuresing were met with hypotension;  The patient's blood pressure dropped as low as 50s. Critical care was consulted initially for possible dopamine involvement, however patient did respond to gentle fluid boluses. As troponins were trending down, pneumonia treated, and blood pressure stabilized, patient's mentation improved. He is felt to be stable and plans are for cardiac catheterization on 2/24.  HPI/Subjective: Per RN was confused this AM and pulled out lines. He has no complaints.   Assessment/Plan:  Acute respiratory failure with hypoxia :   A) Ischemic CM / Acute on chronic systolic heart failure   B) Left upper lobe pneumonia v/s pulmonary infarct  -had recurrent hypotension 2/23 that required fluid boluses -recently had OP meds adjusted due to hypotension- -Lasix decreased to 20 mg daily 2/25 but exam today c/w volume overload so Cards has increased Lasix  -cont to hold BB and ACE I 2/2 recent hypotension - antibiotics discontinued- follow for now- WBC slightly up but no new symptoms  DVT of right lower extremity  -new finding this admission - Cont IV Heparin -have asked CM to clarify co pay of NOAC - CT of the chest will not change  our management, therefore will defer it.   CAD/NSTEMI  -cath 2/25:diffuse dz circumflex and stenosis vein graft right - repeat cath once better diuresed -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  Cheyne-Stokes resp pattern -observed during cath while sedated -? If actually c/w OSA sx's  CKD  stage II -renal fnx stable post cath  HYPERLIPIDEMIA  Hx of Essential hypertension, benign -BP soft - usual meds on hold  Hypothyroidism -cont Synthroid  Acute delirium -resolved  B/l cyanosis - obtain ABI  DVT prophylaxis: IV heparin Code Status: Full Family Communication: No family at bedside yesterday when rounding- just prior to end of my shift on 2/25 was called to bedside to talk with family (Cousin)- LENGTHY d/w her about status/plans Disposition Plan/Expected LOS: Stepdown  Consultants: Cardiology  Procedures: 2-D echocardiogram pending  Lower extremity venous duplex preliminary results consistent with right lower extremity DVT involving posterior tibial and peroneal veins  Antibiotics: Levaquin 2/22 >>>2/25   Objective: Blood pressure 92/53, pulse 75, temperature 98.2 F (36.8 C), temperature source Oral, resp. rate 27, height _0  (1.753 m), weight 87.1 kg (192 lb 0.3 oz), SpO2 96.00%.  Intake/Output Summary (Last 24 hours) at 12/02/13 1038 Last data filed at 12/02/13 0700  Gross per 24 hour  Intake  663.5 ml  Output   1575 ml  Net -911.5 ml   Exam: General: No acute respiratory distress Lungs: Clear to auscultation bilaterally without wheezes or crackles,  2L Cardiovascular: Regular rate and rhythm without murmur gallop or rub normal S1 and S2, unilateral right lower extremity peripheral edema 3+ Abdomen: Nontender,  nondistended, soft, bowel sounds positive, no rebound, no ascites, no appreciable mass Extremities: No significant clubbing of bilateral lower extremities- b/l cyanotic toes noted (chronic per pt) Neurological: Alert and  oriented x 3, moves all extremities x 4 without focal neurological deficits, CN 2-12 intact  Scheduled Meds:  Scheduled Meds: . aspirin EC  81 mg Oral Daily  . atorvastatin  40 mg Oral q1800  . clopidogrel  75 mg Oral Q breakfast  . furosemide  20 mg Intravenous 4 times per day  . levothyroxine  262 mcg Oral QAC breakfast  . potassium chloride  40 mEq Oral BID  . sodium chloride  3 mL Intravenous Q12H   Continuous Infusions: . sodium chloride 5 mL/hr at 12/02/13 0700  . heparin 1,850 Units/hr (12/02/13 0700)    Data Reviewed: Basic Metabolic Panel:  Recent Labs Lab 11/26/13 0317 11/27/13 0341 11/29/13 0312 11/30/13 0231 12/01/13 0225  NA 144 141 140 142 141  K 3.6* 4.3 3.9 4.4 3.5*  CL 109 104 102 102 101  CO2 _0 GLUCOSE 109* 109* 110* 123* 124*  BUN 41* 31* 29* 26* 34*  CREATININE 1.07 0.94 1.01 0.99 1.04  CALCIUM 8.7 9.3 9.0 9.4 9.1   Liver Function Tests: No results found for this basename: AST, ALT, ALKPHOS, BILITOT, PROT, ALBUMIN,  in the last 168 hours CBC:  Recent Labs Lab 11/28/13 0245 11/29/13 0312 11/30/13 0231 12/01/13 0225 12/02/13 0300  WBC 9.8 10.1 9.0 12.5* 11.8*  HGB 11.7* 12.0* 12.5* 12.4* 12.2*  HCT 35.0* 36.3* 37.8* 37.3* 36.0*  MCV 88.2 88.1 88.5 88.6 88.0  PLT 204 219 220 233 216   Cardiac Enzymes: No results found for this basename: CKTOTAL, CKMB, CKMBINDEX, TROPONINI,  in the last 168 hours  Recent Results (from the past 240 hour(s))  MRSA PCR SCREENING     Status: None   Collection Time    11/24/13 10:43 PM      Result Value Ref Range Status   MRSA by PCR NEGATIVE  NEGATIVE Final   Comment:            The GeneXpert MRSA Assay (FDA     approved for NASAL specimens     only), is one component of a     comprehensive MRSA colonization     surveillance program. It is not     intended to diagnose MRSA     infection nor to guide or     monitor treatment for     MRSA infections.     Studies:  Recent x-ray studies  have been reviewed in detail by the Attending Physician  Time spent : 11mns    [name],MD [phone] 12/02/2013, 10:38 AM

## 2013-12-02 NOTE — Progress Notes (Signed)
ANTICOAGULATION CONSULT NOTE   Pharmacy Consult for Heparin  Indication: chest pain/ACS, DVT  Allergies  Allergen Reactions  . Celecoxib     Unknown   . Codeine     Unknown   . Iohexol     Unknown     Patient Measurements: Height: 5\' 9"  (175.3 cm) Weight: 192 lb 0.3 oz (87.1 kg) IBW/kg (Calculated) : 70.7  Vital Signs: Temp: 98.3 F (36.8 C) (02/28 1217) Temp src: Oral (02/28 1217) BP: 98/72 mmHg (02/28 1139) Pulse Rate: 99 (02/28 1217)   Estimated Creatinine Clearance: 65 ml/min (by C-G formula based on Cr of 1.04).   Assessment: [name] and now noted with new RLE DVT. Pt s/p cath with possible staged PCI on Monday. Heparin level is at goal and hg/hct/plt are stable.    Goal of Therapy:  Heparin level 0.3-0.7 units/ml Monitor platelets by anticoagulation protocol: Yes   Plan:  -No heparin changes needed -Daily heparin level and CBC -Will follow plans for cath  Doyle Tegethoff M. Allena KatzPatel, PharmD Clinical Pharmacist- Resident Pager: [phone] Pharmacy: [phone] 12/02/2013 1:12 PM

## 2013-12-02 NOTE — Progress Notes (Signed)
Patient ID: James [name], male   DOB: 04-Jun-1935, 79 y.o.   MRN: [id number]   SUBJECTIVE: Patient denies chest pain or dyspnea at rest.  He seems to be confused versus a degree of dementia.  He did not diurese well yesterday.  SBP has been 90s-100s.   Scheduled Meds: . aspirin EC  81 mg Oral Daily  . atorvastatin  40 mg Oral q1800  . clopidogrel  75 mg Oral Q breakfast  . furosemide  20 mg Intravenous 4 times per day  . levothyroxine  262 mcg Oral QAC breakfast  . potassium chloride  40 mEq Oral BID  . sodium chloride  3 mL Intravenous Q12H   Continuous Infusions: . sodium chloride 5 mL/hr at 12/02/13 1200  . heparin 1,850 Units/hr (12/02/13 1212)   PRN Meds:.sodium chloride, acetaminophen, ondansetron (ZOFRAN) IV, sodium chloride    Filed Vitals:   12/02/13 0300 12/02/13 0748 12/02/13 1139 12/02/13 1217  BP:  92/53 98/72   Pulse: 73 75  99  Temp: 98.3 F (36.8 C) 98.2 F (36.8 C)  98.3 F (36.8 C)  TempSrc: Oral Oral  Oral  Resp: 19 27  37  Height:      Weight: 192 lb 0.3 oz (87.1 kg)     SpO2: 97% 96% 94%     Intake/Output Summary (Last 24 hours) at 12/02/13 1233 Last data filed at 12/02/13 1212  Gross per 24 hour  Intake 1167.7 ml  Output   2125 ml  Net -957.3 ml    LABS: Basic Metabolic Panel:  Recent Labs  14/78/29 0231 12/01/13 0225  NA 142 141  K 4.4 3.5*  CL 102 101  CO2 25 26  GLUCOSE 123* 124*  BUN 26* 34*  CREATININE 0.99 1.04  CALCIUM 9.4 9.1   Liver Function Tests: No results found for this basename: AST, ALT, ALKPHOS, BILITOT, PROT, ALBUMIN,  in the last 72 hours No results found for this basename: LIPASE, AMYLASE,  in the last 72 hours CBC:  Recent Labs  12/01/13 0225 12/02/13 0300  WBC 12.5* 11.8*  HGB 12.4* 12.2*  HCT 37.3* 36.0*  MCV 88.6 88.0  PLT 233 216   Cardiac Enzymes: No results found for this basename: CKTOTAL, CKMB, CKMBINDEX, TROPONINI,  in the last 72 hours BNP: No components found with this basename:  POCBNP,  D-Dimer: No results found for this basename: DDIMER,  in the last 72 hours Hemoglobin A1C: No results found for this basename: HGBA1C,  in the last 72 hours Fasting Lipid Panel: No results found for this basename: CHOL, HDL, LDLCALC, TRIG, CHOLHDL, LDLDIRECT,  in the last 72 hours Thyroid Function Tests: No results found for this basename: TSH, T4TOTAL, FREET3, T3FREE, THYROIDAB,  in the last 72 hours Anemia Panel: No results found for this basename: VITAMINB12, FOLATE, FERRITIN, TIBC, IRON, RETICCTPCT,  in the last 72 hours  RADIOLOGY: Dg Chest Port 1 View  11/26/2013   CLINICAL DATA:  Chest pain and congestive heart failure.  EXAM: PORTABLE CHEST - 1 VIEW  COMPARISON:  11/25/2013  FINDINGS: Previous median sternotomy and CABG procedure. The heart size is moderately enlarged. There is persistent airspace consolidation within the left upper lobe. Mild diffuse edema is identified elsewhere. Calcification within the left ventricular apex is noted which may be the sequelae of prior infarct  IMPRESSION: 1. Left upper lobe pneumonia. 2. Superimposed pulmonary edema compatible with CHF.   Electronically Signed   By: Signa Kell M.D.   On: 11/26/2013 10:55  Dg Chest Port 1 View  11/25/2013   CLINICAL DATA:  Congestive heart failure. Hypotension. Coronary artery disease.  EXAM: PORTABLE CHEST - 1 VIEW  COMPARISON:  11/24/13  FINDINGS: Left upper lobe airspace disease shows no significant interval change. Cardiomegaly and pulmonary vascular congestion are stable. No evidence of pleural effusion. Prior CABG again noted.  IMPRESSION: No significant change in left upper lobe airspace disease, suspicious for pneumonia.   Electronically Signed   By: [name]  Stahl M.D.   On: 11/25/2013 20:14    PHYSICAL EXAM General: NAD Neck: JVP 12 cm, no thyromegaly or thyroid nodule.  Lungs: Clear to auscultation bilaterally with normal respiratory effort. CV: Nondisplaced PMI.  Heart regular S1/S2, no S3/S4,  no murmur.  2+ edema to knee right leg.  No carotid bruit.   Abdomen: Soft, nontender, no hepatosplenomegaly, no distention.  Neurologic: Alert and oriented x 3.  Psych: Normal affect. Extremities: No clubbing or cyanosis.   TELEMETRY: Reviewed telemetry pt in NSR  ASSESSMENT AND PLAN: 79 yo with history of CAD s/p CABG and chronic systolic CHF was admitted with NSTEMI.  He also has acute on chronic systolic CHF and was found to have a RLE DVT.   1. CAD: NSTEMI with complex disease in SVG-OM.  This is a potential target for intervention, maybe Monday, but need to make improvement in CHF prior.   - Continue heparin gtt, ASA 81, Plavix, atorvastatin.  - Possible PCI Monday if CHF improved.  2. Acute on chronic systolic CHF:  No recent echo.  Prior EF 40% (2011).  He is volume overloaded on exam with soft blood pressure (SBP 90s).  Creatinine so far stable but not done today.   - Needs BMET today.  - Echo today - Will place PICC.  Will allow monitoring of CVP and co-ox.  ? Low output state with low BP.   - Change Lasix to 60 mg IV every 8 hours, follow I/Os closely.  3. RLE DVT: Continue heparin gtt, will need to transition to coumadin versus NOAC eventually.    Marca AnconaDalton Compton Brigance 12/02/2013 12:44 PM

## 2013-12-02 NOTE — Progress Notes (Signed)
Peripherally Inserted Central Catheter/Midline Placement  The IV Nurse has discussed with the patient and/or persons authorized to consent for the patient, the purpose of this procedure and the potential benefits and risks involved with this procedure.  The benefits include less needle sticks, lab draws from the catheter and patient may be discharged home with the catheter.  Risks include, but not limited to, infection, bleeding, blood clot (thrombus formation), and puncture of an artery; nerve damage and irregular heat beat.  Alternatives to this procedure were also discussed.  PICC/Midline Placement Documentation  PICC / Midline Double Lumen 12/02/13 PICC Right Basilic 35 cm 0 cm (Active)  Indication for Insertion or Continuance of Line Prolonged intravenous therapies 12/02/2013  6:01 PM  Exposed Catheter (cm) 0 cm 12/02/2013  6:01 PM  Lumen #1 Status Flushed;Saline locked;Blood return noted 12/02/2013  6:01 PM  Lumen #2 Status Flushed;Saline locked;Blood return noted 12/02/2013  6:01 PM  Dressing Intervention New dressing 12/02/2013  6:01 PM  Dressing Change Due 12/09/13 12/02/2013  6:01 PM       Ethelda ChickCurrie, Keshawn Sundberg Cadence 12/02/2013, 6:08 PM

## 2013-12-03 DIAGNOSIS — R230 Cyanosis: Secondary | ICD-10-CM

## 2013-12-03 LAB — URINALYSIS, ROUTINE W REFLEX MICROSCOPIC
Bilirubin Urine: NEGATIVE
Glucose, UA: NEGATIVE mg/dL
Hgb urine dipstick: NEGATIVE
KETONES UR: NEGATIVE mg/dL
Leukocytes, UA: NEGATIVE
NITRITE: NEGATIVE
Protein, ur: NEGATIVE mg/dL
Specific Gravity, Urine: 1.014 (ref 1.005–1.030)
UROBILINOGEN UA: 1 mg/dL (ref 0.0–1.0)
pH: 6.5 (ref 5.0–8.0)

## 2013-12-03 LAB — BASIC METABOLIC PANEL
BUN: 36 mg/dL — ABNORMAL HIGH (ref 6–23)
CO2: 24 meq/L (ref 19–32)
CREATININE: 1.01 mg/dL (ref 0.50–1.35)
Calcium: 9.7 mg/dL (ref 8.4–10.5)
Chloride: 100 mEq/L (ref 96–112)
GFR calc Af Amer: 81 mL/min — ABNORMAL LOW (ref >90)
GFR calc non Af Amer: 70 mL/min — ABNORMAL LOW (ref >90)
GLUCOSE: 116 mg/dL — AB (ref 70–99)
Potassium: 4.9 mEq/L (ref 3.7–5.3)
SODIUM: 140 meq/L (ref 137–147)

## 2013-12-03 LAB — CARBOXYHEMOGLOBIN
Carboxyhemoglobin: 1.6 % — ABNORMAL HIGH (ref 0.5–1.5)
Methemoglobin: 0.9 % (ref 0.0–1.5)
O2 SAT: 52.1 %
Total hemoglobin: 12.3 g/dL — ABNORMAL LOW (ref 13.5–18.0)

## 2013-12-03 LAB — CBC
HEMATOCRIT: 37.3 % — AB (ref 39.0–52.0)
HEMOGLOBIN: 12.4 g/dL — AB (ref 13.0–17.0)
MCH: 29.2 pg (ref 26.0–34.0)
MCHC: 33.2 g/dL (ref 30.0–36.0)
MCV: 88 fL (ref 78.0–100.0)
Platelets: 234 10*3/uL (ref 150–400)
RBC: 4.24 MIL/uL (ref 4.22–5.81)
RDW: 13.7 % (ref 11.5–15.5)
WBC: 13.8 10*3/uL — ABNORMAL HIGH (ref 4.0–10.5)

## 2013-12-03 LAB — HEPARIN LEVEL (UNFRACTIONATED)
HEPARIN UNFRACTIONATED: 0.81 [IU]/mL — AB (ref 0.30–0.70)
Heparin Unfractionated: <0.1 IU/mL — ABNORMAL LOW (ref 0.30–0.70)

## 2013-12-03 MED ORDER — HEPARIN BOLUS VIA INFUSION
3000.0000 [IU] | Freq: Once | INTRAVENOUS | Status: AC
  Administered 2013-12-03: 3000 [IU] via INTRAVENOUS
  Filled 2013-12-03: qty 3000

## 2013-12-03 MED ORDER — MILRINONE IN DEXTROSE 20 MG/100ML IV SOLN
0.1250 ug/kg/min | INTRAVENOUS | Status: AC
  Administered 2013-12-03 – 2013-12-04 (×2): 0.25 ug/kg/min via INTRAVENOUS
  Filled 2013-12-03 (×2): qty 100

## 2013-12-03 MED ORDER — FUROSEMIDE 10 MG/ML IJ SOLN
80.0000 mg | Freq: Three times a day (TID) | INTRAMUSCULAR | Status: DC
  Administered 2013-12-03 – 2013-12-04 (×3): 80 mg via INTRAVENOUS
  Filled 2013-12-03 (×4): qty 8

## 2013-12-03 NOTE — Progress Notes (Signed)
Patient ID: James [name], male   DOB: 03-07-1935, 79 y.o.   MRN: [id number]   SUBJECTIVE: Patient denies chest pain or dyspnea at rest.  There seems to be a degree of dementia.  He did not diurese well yesterday despite increasing Lasix. He has been incontinent.  Weight is up.  Creatinine stable, SBP in the 100s.  Co-ox 52%. I measured CVP today, it was about 8-9.    Scheduled Meds: . aspirin EC  81 mg Oral Daily  . atorvastatin  40 mg Oral q1800  . clopidogrel  75 mg Oral Q breakfast  . furosemide  80 mg Intravenous 3 times per day  . levothyroxine  262 mcg Oral QAC breakfast  . sodium chloride  10-40 mL Intracatheter Q12H  . sodium chloride  3 mL Intravenous Q12H   Continuous Infusions: . sodium chloride 5 mL/hr at 12/02/13 2000  . heparin 2,100 Units/hr (12/03/13 1239)  . milrinone     PRN Meds:.sodium chloride, acetaminophen, ondansetron (ZOFRAN) IV, sodium chloride, sodium chloride    Filed Vitals:   12/02/13 2351 12/03/13 0400 12/03/13 0732 12/03/13 1222  BP:  113/78 103/61 112/72  Pulse:  80    Temp: 98.6 F (37 C) 98.7 F (37.1 C) 98.9 F (37.2 C) 98.4 F (36.9 C)  TempSrc: Oral Oral Oral Oral  Resp:  17  16  Height:      Weight:  88 kg (194 lb 0.1 oz)    SpO2:  96% 94% 96%    Intake/Output Summary (Last 24 hours) at 12/03/13 1256 Last data filed at 12/03/13 1000  Gross per 24 hour  Intake 3875.05 ml  Output   1950 ml  Net 1925.05 ml    LABS: Basic Metabolic Panel:  Recent Labs  40/98/11 1520 12/03/13 0400  NA 136* 140  K 4.1 4.9  CL 96 100  CO2 23 24  GLUCOSE 120* 116*  BUN 35* 36*  CREATININE 1.00 1.01  CALCIUM 9.0 9.7   Liver Function Tests: No results found for this basename: AST, ALT, ALKPHOS, BILITOT, PROT, ALBUMIN,  in the last 72 hours No results found for this basename: LIPASE, AMYLASE,  in the last 72 hours CBC:  Recent Labs  12/02/13 0300 12/03/13 0400  WBC 11.8* 13.8*  HGB 12.2* 12.4*  HCT 36.0* 37.3*  MCV 88.0 88.0    PLT 216 234   Cardiac Enzymes: No results found for this basename: CKTOTAL, CKMB, CKMBINDEX, TROPONINI,  in the last 72 hours BNP: No components found with this basename: POCBNP,  D-Dimer: No results found for this basename: DDIMER,  in the last 72 hours Hemoglobin A1C: No results found for this basename: HGBA1C,  in the last 72 hours Fasting Lipid Panel: No results found for this basename: CHOL, HDL, LDLCALC, TRIG, CHOLHDL, LDLDIRECT,  in the last 72 hours Thyroid Function Tests: No results found for this basename: TSH, T4TOTAL, FREET3, T3FREE, THYROIDAB,  in the last 72 hours Anemia Panel: No results found for this basename: VITAMINB12, FOLATE, FERRITIN, TIBC, IRON, RETICCTPCT,  in the last 72 hours  RADIOLOGY: Dg Chest Port 1 View  11/26/2013   CLINICAL DATA:  Chest pain and congestive heart failure.  EXAM: PORTABLE CHEST - 1 VIEW  COMPARISON:  11/25/2013  FINDINGS: Previous median sternotomy and CABG procedure. The heart size is moderately enlarged. There is persistent airspace consolidation within the left upper lobe. Mild diffuse edema is identified elsewhere. Calcification within the left ventricular apex is noted which may be the  sequelae of prior infarct  IMPRESSION: 1. Left upper lobe pneumonia. 2. Superimposed pulmonary edema compatible with CHF.   Electronically Signed   By: Signa Kellaylor  Stroud M.D.   On: 11/26/2013 10:55   Dg Chest Port 1 View  11/25/2013   CLINICAL DATA:  Congestive heart failure. Hypotension. Coronary artery disease.  EXAM: PORTABLE CHEST - 1 VIEW  COMPARISON:  11/24/13  FINDINGS: Left upper lobe airspace disease shows no significant interval change. Cardiomegaly and pulmonary vascular congestion are stable. No evidence of pleural effusion. Prior CABG again noted.  IMPRESSION: No significant change in left upper lobe airspace disease, suspicious for pneumonia.   Electronically Signed   By: [name]  Stahl M.D.   On: 11/25/2013 20:14    PHYSICAL EXAM General:  NAD Neck: JVP 10-12 cm, no thyromegaly or thyroid nodule.  Lungs: Clear to auscultation bilaterally with normal respiratory effort. CV: Nondisplaced PMI.  Heart regular S1/S2, no S3/S4, 1/6 HSM apex.  2+ edema to knee right leg.  No carotid bruit.   Abdomen: Soft, nontender, no hepatosplenomegaly, no distention.  Neurologic: Alert and oriented x 3.  Psych: Normal affect. Extremities: No clubbing or cyanosis.   TELEMETRY: Reviewed telemetry pt in NSR  ASSESSMENT AND PLAN: 79 yo with history of CAD s/p CABG and chronic systolic CHF was admitted with NSTEMI.  He also has acute on chronic systolic CHF and was found to have a RLE DVT.   1. CAD: NSTEMI with complex disease in SVG-OM.  This is a potential target for intervention,  but need to make improvement in CHF prior.   - Continue heparin gtt, ASA 81, Plavix, atorvastatin.  - Can reassess Monday am for PCI Monday but may need another day of diuresis.  2. Acute on chronic systolic CHF:  Echo with EF 20-25%, diffuse hypokinesis, mild to moderate RV dysfunction, moderate pulmonary hypertension, moderate MR.  He is volume overloaded on exam with soft blood pressure (SBP 90s-100s).  Poor diuresis yesterday.  CVP 8-9 today but LVEDP markedly elevated at cardiac cath.  Co-ox 52%. Creatinine stable.  - With evidence for low output, will add milrinone 0.25 mcg/kg/min.   - Increase Lasix to 80 mg IV every 8 hours.  - Follow co-ox and CVP.   3. RLE DVT: Continue heparin gtt, will need to transition to coumadin versus NOAC eventually.    Marca AnconaDalton Dajanique Robley 12/03/2013 12:56 PM

## 2013-12-03 NOTE — Progress Notes (Signed)
[name]  JOUD INGWERSEN HCW:[id number] DOB: Feb 09, 1935 DOA: 11/24/2013 PCP: [name], DO  Brief narrative: 79 year old male with history of CAD status post CABG as well as congestive heart failure, grade 1 diastolic and systolic CHF with a decreased ejection fraction of 40% who was transferred from [facility] emergency room after presenting there on 2/20 with complaints of shortness of breath and lower extremity swelling x2 days. He was found to have a troponin of 3.5 and chest x-ray noted pneumonia and congestive heart failure so patient was transferred to the hospitalist service and placed in the step down unit. Patient's cardiologist is Dr. Percival Spanish of [facility] heart care and was consulted.   Attempts at diuresing were met with hypotension;  The patient's blood pressure dropped as low as 50s. Critical care was consulted initially for possible dopamine involvement, however patient did respond to gentle fluid boluses. As troponins were trending down, pneumonia treated, and blood pressure stabilized, patient's mentation improved. He is felt to be stable and plans are for cardiac catheterization on 2/24.  HPI/Subjective: Alert- no complaints.   Assessment/Plan:  Acute respiratory failure with hypoxia :   A) Ischemic CM / Acute on chronic systolic heart failure   B) Left upper lobe pneumonia v/s pulmonary infarct  -had recurrent hypotension 2/23 that required fluid boluses -recently had OP meds adjusted due to hypotension- -Lasix decreased to 20 mg daily 2/25 but exam today c/w volume overload so Cards has increased Lasix  -cont to hold BB and ACE I 2/2 recent hypotension - antibiotics discontinued- follow for now- WBC continues to rise- will resume  DVT of right lower extremity  -new finding this admission - Cont IV Heparin -have asked CM to clarify co pay of NOAC - CT of the chest will not change our management, therefore will defer it.    CAD/NSTEMI  -cath 2/25:diffuse dz circumflex and stenosis vein graft right - repeat cath once better diuresed -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  Cheyne-Stokes resp pattern -observed during cath while sedated -? If actually c/w OSA sx's  CKD  stage II -renal fnx stable post cath  HYPERLIPIDEMIA  Hx of Essential hypertension, benign -BP soft - usual meds on hold  Hypothyroidism -cont Synthroid  Acute delirium -resolved  B/l cyanosis - obtain ABI  DVT prophylaxis: IV heparin Code Status: Full Family Communication: No family at bedside yesterday when rounding- just prior to end of my shift on 2/25 was called to bedside to talk with family (Cousin)- LENGTHY d/w her about status/plans Disposition Plan/Expected LOS: Stepdown  Consultants: Cardiology  Procedures: 2-D echocardiogram pending  Lower extremity venous duplex preliminary results consistent with right lower extremity DVT involving posterior tibial and peroneal veins  Antibiotics: Levaquin 2/22 >>>2/25   Objective: Blood pressure 112/72, pulse 80, temperature 98.4 F (36.9 C), temperature source Oral, resp. rate 16, height 5' 9"  (1.753 m), weight 88 kg (194 lb 0.1 oz), SpO2 96.00%.  Intake/Output Summary (Last 24 hours) at 12/03/13 1328 Last data filed at 12/03/13 1000  Gross per 24 hour  Intake 3855.25 ml  Output   1600 ml  Net 2255.25 ml   Exam: General: No acute respiratory distress Lungs: Clear to auscultation bilaterally without wheezes or crackles,  2L Cardiovascular: Regular rate and rhythm without murmur gallop or rub normal S1 and S2, unilateral right lower extremity peripheral edema 3+ Abdomen: Nontender, nondistended, soft, bowel sounds positive, no rebound, no ascites, no appreciable mass Extremities:  No significant clubbing of bilateral lower extremities- b/l cyanotic toes noted (chronic per pt) Neurological: Alert and oriented x 3, moves all extremities x 4  without focal neurological deficits, CN 2-12 intact  Scheduled Meds:  Scheduled Meds: . aspirin EC  81 mg Oral Daily  . atorvastatin  40 mg Oral q1800  . clopidogrel  75 mg Oral Q breakfast  . furosemide  80 mg Intravenous 3 times per day  . levothyroxine  262 mcg Oral QAC breakfast  . sodium chloride  10-40 mL Intracatheter Q12H  . sodium chloride  3 mL Intravenous Q12H   Continuous Infusions: . sodium chloride 5 mL/hr at 12/02/13 2000  . heparin 2,100 Units/hr (12/03/13 1239)  . milrinone      Data Reviewed: Basic Metabolic Panel:  Recent Labs Lab 11/29/13 0312 11/30/13 0231 12/01/13 0225 12/02/13 1520 12/03/13 0400  NA 140 142 141 136* 140  K 3.9 4.4 3.5* 4.1 4.9  CL 102 102 101 96 100  CO2 25 25 26 23 24   GLUCOSE 110* 123* 124* 120* 116*  BUN 29* 26* 34* 35* 36*  CREATININE 1.01 0.99 1.04 1.00 1.01  CALCIUM 9.0 9.4 9.1 9.0 9.7   Liver Function Tests: No results found for this basename: AST, ALT, ALKPHOS, BILITOT, PROT, ALBUMIN,  in the last 168 hours CBC:  Recent Labs Lab 11/29/13 0312 11/30/13 0231 12/01/13 0225 12/02/13 0300 12/03/13 0400  WBC 10.1 9.0 12.5* 11.8* 13.8*  HGB 12.0* 12.5* 12.4* 12.2* 12.4*  HCT 36.3* 37.8* 37.3* 36.0* 37.3*  MCV 88.1 88.5 88.6 88.0 88.0  PLT 219 220 233 216 234   Cardiac Enzymes: No results found for this basename: CKTOTAL, CKMB, CKMBINDEX, TROPONINI,  in the last 168 hours  Recent Results (from the past 240 hour(s))  MRSA PCR SCREENING     Status: None   Collection Time    11/24/13 10:43 PM      Result Value Ref Range Status   MRSA by PCR NEGATIVE  NEGATIVE Final   Comment:            The GeneXpert MRSA Assay (FDA     approved for NASAL specimens     only), is one component of a     comprehensive MRSA colonization     surveillance program. It is not     intended to diagnose MRSA     infection nor to guide or     monitor treatment for     MRSA infections.     Studies:  Recent x-ray studies have been  reviewed in detail by the Attending Physician  Time spent : 15mns    Bricelyn Freestone,MD [phone] 12/03/2013, 1:28 PM

## 2013-12-03 NOTE — Progress Notes (Addendum)
VASCULAR LAB PRELIMINARY  ARTERIAL  ABI completed:    RIGHT    LEFT    PRESSURE WAVEFORM  PRESSURE WAVEFORM  BRACHIAL  Unable to obtain due to bandaging. BRACHIAL 97 Triphasic  DP Unable to obtain due to acute DVT Triphasic DP 77 Monophasic  AT   AT    PT Unable to obtain due to acute DVT Triphasic PT  Unable to insonate.  PER   PER    GREAT TOE  NA GREAT TOE  NA    RIGHT LEFT  ABI Unable to calculate. 0.79   Unable to calculate the right ABI due to inability to obtain right lower extremity arterial pressures. The left ABI is suggestive of mild arterial insufficiency.   12/03/2013 10:20 AM Gertie FeyMichelle Kaileb Monsanto, RVT, RDCS, RDMS

## 2013-12-03 NOTE — Progress Notes (Signed)
ANTICOAGULATION CONSULT NOTE - Follow Up Consult  Pharmacy Consult for heparin Indication: DVT and NSTEMI  Labs:  Recent Labs  12/01/13 0225 12/02/13 0300 12/02/13 1520 12/03/13 0400  HGB 12.4* 12.2*  --  12.4*  HCT 37.3* 36.0*  --  37.3*  PLT 233 216  --  234  HEPARINUNFRC 0.37 0.32  --  <0.10*  CREATININE 1.04  --  1.00 1.01    Assessment: 79yo male now undetectable on heparin after several levels at goal though had been trending down to low end of goal, RN reports no issues w/ gtt.  Goal of Therapy:  Heparin level 0.3-0.7 units/ml   Plan:  Will rebolus with heparin 3000 units and increase gtt by 3-4 units/kg/hr to 2100 units/hr and check level in 8hr.  Vernard GamblesVeronda Lakendrick Paradis, PharmD, BCPS  12/03/2013,6:37 AM

## 2013-12-03 NOTE — Progress Notes (Signed)
ANTICOAGULATION CONSULT NOTE   Pharmacy Consult for Heparin  Indication: chest pain/ACS, DVT  Allergies  Allergen Reactions  . Celecoxib     Unknown   . Codeine     Unknown   . Iohexol     Unknown     Patient Measurements: Height: 5\' 9"  (175.3 cm) Weight: 194 lb 0.1 oz (88 kg) IBW/kg (Calculated) : 70.7  Vital Signs: Temp: 98.4 F (36.9 C) (03/01 1222) Temp src: Oral (03/01 1222) BP: 112/72 mmHg (03/01 1222) Pulse Rate: 80 (03/01 0400)   Estimated Creatinine Clearance: 67.2 ml/min (by C-G formula based on Cr of 1.01).   Assessment: [name] for [place] and now noted with new RLE DVT. Pt s/p cath with possible staged PCI on Monday. Heparin level is supratherapeutic  Goal of Therapy:  Heparin level 0.3-0.7 units/ml Monitor platelets by anticoagulation protocol: Yes   Plan:  -Adjust Heparin 1900 u/hr -Daily heparin level and CBC -Will follow plans for cath  James [name] M. James [name], PharmD Clinical Pharmacist- Resident Pager: [phone] Pharmacy: [phone] 12/03/2013 3:10 PM

## 2013-12-04 LAB — CARBOXYHEMOGLOBIN
Carboxyhemoglobin: 1.9 % — ABNORMAL HIGH (ref 0.5–1.5)
Methemoglobin: 0.7 % (ref 0.0–1.5)
O2 Saturation: 65.5 %
TOTAL HEMOGLOBIN: 12.8 g/dL — AB (ref 13.5–18.0)

## 2013-12-04 LAB — HEPARIN LEVEL (UNFRACTIONATED)
HEPARIN UNFRACTIONATED: 0.3 [IU]/mL (ref 0.30–0.70)
HEPARIN UNFRACTIONATED: 0.34 [IU]/mL (ref 0.30–0.70)

## 2013-12-04 LAB — BASIC METABOLIC PANEL
BUN: 34 mg/dL — AB (ref 6–23)
CALCIUM: 9.2 mg/dL (ref 8.4–10.5)
CHLORIDE: 94 meq/L — AB (ref 96–112)
CO2: 27 meq/L (ref 19–32)
CREATININE: 1.01 mg/dL (ref 0.50–1.35)
GFR calc Af Amer: 81 mL/min — ABNORMAL LOW (ref >90)
GFR calc non Af Amer: 70 mL/min — ABNORMAL LOW (ref >90)
GLUCOSE: 112 mg/dL — AB (ref 70–99)
Potassium: 3.7 mEq/L (ref 3.7–5.3)
Sodium: 135 mEq/L — ABNORMAL LOW (ref 137–147)

## 2013-12-04 LAB — CBC
HCT: 37.1 % — ABNORMAL LOW (ref 39.0–52.0)
Hemoglobin: 12.7 g/dL — ABNORMAL LOW (ref 13.0–17.0)
MCH: 29.7 pg (ref 26.0–34.0)
MCHC: 34.2 g/dL (ref 30.0–36.0)
MCV: 86.7 fL (ref 78.0–100.0)
PLATELETS: 229 10*3/uL (ref 150–400)
RBC: 4.28 MIL/uL (ref 4.22–5.81)
RDW: 13.3 % (ref 11.5–15.5)
WBC: 12.3 10*3/uL — ABNORMAL HIGH (ref 4.0–10.5)

## 2013-12-04 LAB — MAGNESIUM: Magnesium: 2 mg/dL (ref 1.5–2.5)

## 2013-12-04 MED ORDER — MILRINONE IN DEXTROSE 20 MG/100ML IV SOLN
0.1250 ug/kg/min | INTRAVENOUS | Status: DC
  Administered 2013-12-04: 0.125 ug/kg/min via INTRAVENOUS
  Filled 2013-12-04: qty 100

## 2013-12-04 MED ORDER — DIGOXIN 250 MCG PO TABS
0.2500 mg | ORAL_TABLET | Freq: Once | ORAL | Status: AC
  Administered 2013-12-04: 0.25 mg via ORAL
  Filled 2013-12-04: qty 1

## 2013-12-04 MED ORDER — SODIUM CHLORIDE 0.9 % IJ SOLN: 3.0000 mL | INTRAMUSCULAR | Status: DC | PRN

## 2013-12-04 MED ORDER — FUROSEMIDE 40 MG PO TABS
40.0000 mg | ORAL_TABLET | Freq: Two times a day (BID) | ORAL | Status: DC
  Filled 2013-12-04 (×2): qty 1

## 2013-12-04 MED ORDER — DIGOXIN 125 MCG PO TABS
0.1250 mg | ORAL_TABLET | Freq: Every day | ORAL | Status: DC
  Administered 2013-12-05 – 2013-12-14 (×10): 0.125 mg via ORAL
  Filled 2013-12-04 (×10): qty 1

## 2013-12-04 MED ORDER — FUROSEMIDE 40 MG PO TABS: 40.0000 mg | ORAL_TABLET | Freq: Two times a day (BID) | ORAL | Status: DC

## 2013-12-04 MED ORDER — SODIUM CHLORIDE 0.9 % IJ SOLN
3.0000 mL | Freq: Two times a day (BID) | INTRAMUSCULAR | Status: DC
  Administered 2013-12-05 (×2): 3 mL via INTRAVENOUS

## 2013-12-04 MED ORDER — SODIUM CHLORIDE 0.9 % IV SOLN
250.0000 mL | INTRAVENOUS | Status: DC | PRN
Start: 2013-12-04 — End: 2013-12-06

## 2013-12-04 MED ORDER — ASPIRIN 81 MG PO CHEW
81.0000 mg | CHEWABLE_TABLET | ORAL | Status: AC
  Administered 2013-12-05: 81 mg via ORAL
  Filled 2013-12-04: qty 1

## 2013-12-04 MED ORDER — POTASSIUM CHLORIDE CRYS ER 20 MEQ PO TBCR
40.0000 meq | EXTENDED_RELEASE_TABLET | Freq: Once | ORAL | Status: AC
  Administered 2013-12-04: 40 meq via ORAL
  Filled 2013-12-04: qty 2

## 2013-12-04 MED ORDER — HEPARIN (PORCINE) IN NACL 100-0.45 UNIT/ML-% IJ SOLN
1600.0000 [IU]/h | INTRAMUSCULAR | Status: DC
  Administered 2013-12-04 – 2013-12-06 (×4): 2000 [IU]/h via INTRAVENOUS
  Filled 2013-12-04 (×7): qty 250

## 2013-12-04 MED ORDER — SPIRONOLACTONE 12.5 MG HALF TABLET
12.5000 mg | ORAL_TABLET | Freq: Every day | ORAL | Status: DC
  Filled 2013-12-04: qty 1

## 2013-12-04 MED ORDER — DOPAMINE-DEXTROSE 3.2-5 MG/ML-% IV SOLN
2.0000 ug/kg/min | INTRAVENOUS | Status: DC
  Administered 2013-12-04: 2 ug/kg/min via INTRAVENOUS
  Filled 2013-12-04: qty 250

## 2013-12-04 MED ORDER — LISINOPRIL 2.5 MG PO TABS
2.5000 mg | ORAL_TABLET | Freq: Every day | ORAL | Status: DC
  Administered 2013-12-04: 2.5 mg via ORAL
  Filled 2013-12-04: qty 1

## 2013-12-04 MED ORDER — SPIRONOLACTONE 12.5 MG HALF TABLET
12.5000 mg | ORAL_TABLET | Freq: Every day | ORAL | Status: DC
  Administered 2013-12-04 – 2013-12-07 (×4): 12.5 mg via ORAL
  Filled 2013-12-04 (×5): qty 1

## 2013-12-04 MED ORDER — DOPAMINE-DEXTROSE 3.2-5 MG/ML-% IV SOLN
INTRAVENOUS | Status: AC
  Administered 2013-12-04: 2 ug/kg/min via INTRAVENOUS
  Filled 2013-12-04: qty 250

## 2013-12-04 NOTE — Progress Notes (Signed)
Pt having very frequent 4-5 beat bursts of Vtach.  Asymptomatic, self resolving, 109/46  HR 80's, RR 25 95% on RA. Lenny Pastelom Callahan, NP notified of same.  Magnesium level to be drawn with AM labs

## 2013-12-04 NOTE — Progress Notes (Addendum)
James [name] - Stepdown / ICU Progress Note  James [name] DOA: 11/24/2013 PCP: [name], DO  Brief narrative: 79 year old male with history of CAD status post CABG as well as congestive heart failure, grade [name] diastolic and systolic CHF with a decreased ejection fraction of 40% who was transferred from [facility] emergency room after presenting there on 2/20 with complaints of shortness of breath and lower extremity swelling x2 days. He was found to have a troponin of 3.5 and chest x-ray noted pneumonia and congestive heart failure so patient was transferred to the hospitalist service and placed in the step down unit. Patient's cardiologist is Dr. Percival Spanish of [facility] heart care and was consulted.   Attempts at diuresing were met with hypotension;  The patient's blood pressure dropped as low as 50s. Critical care was consulted initially for possible dopamine involvement, however patient did respond to gentle fluid boluses. As troponins were trending down, pneumonia treated, and blood pressure stabilized, patient's mentation improved. He is felt to be stable and plans are for cardiac catheterization on 2/24.  Assessment/Plan:  Acute respiratory failure with hypoxia :   A) Ischemic CM / Acute on chronic systolic heart failure   B) Left upper lobe pneumonia v/s pulmonary infarct  -had recurrent hypotension 2/23 that required fluid boluses -recently had OP meds adjusted due to hypotension -Lasix decreased to 20 mg daily 2/25 but volume overload persisted so Cards increased IV Lasix -cont to hold BB and ACE I 2/2 recent hypotension -need to clarify if PE since if PE anticoagulation would be 6 months as opposed to 3 months -Antibiotics discontinued and are following clinical exam -Milrinone added by Cardiology  -Management of heart failure and ischemia per Cardiology team  DVT of right lower extremity  -new finding this admission -already on IV Heparin for  cardiac ischemia as well -have asked CM to clarify co pay of NOAC  CAD/NSTEMI  -per Cards -cath 2/25:diffuse dz circumflex and stenosis vein graft right-interventional decision pending -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 -Repeat catheterization planned once patient adequately diuresed- tentative plan for 12/05/13  Persistent leukocytosis -Suspect related to recanalization in acute DVT right lower extremity noting mild erythema has slightly worsened right lower extremity but this is not consistent with a cellulitic process  Hypotension -recurrent and seems r/t meds not sepsis  CKD  stage II -renal fnx stable post cath  HYPERLIPIDEMIA  Hx of Essential hypertension, benign -BP soft - usual meds on hold  Hypothyroidism -cont Synthroid  Acute delirium -resolving  DVT prophylaxis: IV heparin Code Status: Full Family Communication: No family at bedside  Disposition Plan/Expected LOS: Stepdown  Consultants: Cardiology  Procedures: 2-D echocardiogram  - Procedure narrative: Transthoracic echocardiography. Image quality was adequate. The study was technically difficult, as a result of poor acoustic windows and poor sound wave transmission. Intravenous contrast (Definity) was administered. - Left ventricle: The cavity size was severely dilated. Wall thickness was normal. Systolic function was severely reduced. The estimated ejection fraction was in the range of 20% to 25%. Diffuse hypokinesis. Indeterminant diastolic function. - Aortic valve: There was no stenosis. Trivial regurgitation. - Mitral valve: Mildly calcified annulus. Mildly calcified leaflets . Moderate central regurgitation. - Left atrium: The atrium was moderately dilated. - Right ventricle: The cavity size was mildly to moderately dilated. Systolic function was mildly reduced. - Right atrium: The atrium was mildly dilated. - Tricuspid valve: Peak RV-RA gradient: 8m Hg (S). -  Pulmonary arteries:  PA peak pressure: 76m Hg (S). - Systemic veins: IVC measured 2.2 cm with < 50% respirophasic variation, suggesting RA pressure 15 mmHg.  Repeat 2-D echocardiogram pending   Ankle-brachial index pending  Lower extremity venous duplex  preliminary results consistent with right lower extremity DVT involving posterior tibial and peroneal veins  Antibiotics: Levaquin 2/22 >>>2/25  HPI/Subjective: Awake and hungry. No complaints. Currently appears mostly oriented and is not agitated or pulling at devices.  Objective: Blood pressure 90/54, pulse 93, temperature 98.5 F (36.9 C), temperature source Oral, resp. rate 23, height 5' 9"  ([name] m), weight 187 lb 13.3 oz (85.2 kg), SpO2 96.00%.  Intake/Output Summary (Last 24 hours) at 12/04/13 1316 Last data filed at 12/04/13 1300  Gross per 24 hour  Intake 1016.71 ml  Output   3225 ml  Net -2208.29 ml   Exam: General: No acute respiratory distress Lungs: Clear to auscultation bilaterally without wheezes or crackles,  2L Cardiovascular: Regular rate and rhythm without murmur gallop or rub normal S1 and S2, unilateral right lower extremity peripheral edema 2+ with associated mild erythematous changes  Abdomen: Nontender, nondistended, soft, bowel sounds positive, no rebound, no ascites, no appreciable mass Musculoskeletal: No significant cyanosis, clubbing of bilateral lower extremities-dressing over R groin post cath   Scheduled Meds:  Scheduled Meds: . [START ON 12/05/2013] aspirin  81 mg Oral Pre-Cath  . aspirin EC  81 mg Oral Daily  . atorvastatin  40 mg Oral q1800  . clopidogrel  75 mg Oral Q breakfast  . [START ON 12/05/2013] digoxin  0.125 mg Oral Daily  . furosemide  40 mg Oral BID  . levothyroxine  262 mcg Oral QAC breakfast  . lisinopril  2.5 mg Oral Daily  . sodium chloride  10-40 mL Intracatheter Q12H  . sodium chloride  3 mL Intravenous Q12H  . sodium chloride  3 mL Intravenous Q12H  . spironolactone   12.5 mg Oral Daily   Continuous Infusions: . sodium chloride 5 mL/hr at 12/02/13 2000  . heparin 2,000 Units/hr (12/04/13 0845)    Data Reviewed: Basic Metabolic Panel:  Recent Labs Lab 11/30/13 0231 12/01/13 0225 12/02/13 1520 12/03/13 0400 12/04/13 0430 12/04/13 0431  NA 142 141 136* 140  --  135*  K 4.4 3.5* 4.[name] 4.9  --  3.7  CL 102 101 96 100  --  94*  CO2 25 26 23 24   --  27  GLUCOSE 123* 124* 120* 116*  --  112*  BUN 26* 34* 35* 36*  --  34*  CREATININE 0.99 [name].04 [name].00 [name].01  --  [name].01  CALCIUM 9.4 9.[name] 9.0 9.7  --  9.2  MG  --   --   --   --  2.0  --    Liver Function Tests: No results found for this basename: AST, ALT, ALKPHOS, BILITOT, PROT, ALBUMIN,  in the last 168 hours  CBC:  Recent Labs Lab 11/30/13 0231 12/01/13 0225 12/02/13 0300 12/03/13 0400 12/04/13 0431  WBC 9.0 12.5* 11.8* 13.8* 12.3*  HGB 12.5* 12.4* 12.2* 12.4* 12.7*  HCT 37.8* 37.3* 36.0* 37.3* 37.[name]*  MCV 88.5 88.6 88.0 88.0 86.7  PLT 220 233 216 234 229    Recent Results (from the past 240 hour(s))  MRSA PCR SCREENING     Status: None   Collection Time    11/24/13 10:43 PM      Result Value Ref Range Status   MRSA by PCR NEGATIVE  NEGATIVE Final   Comment:  The GeneXpert MRSA Assay (FDA     approved for NASAL specimens     only), is one component of a     comprehensive MRSA colonization     surveillance program. It is not     intended to diagnose MRSA     infection nor to guide or     monitor treatment for     MRSA infections.     Studies:  Recent x-ray studies have been reviewed in detail by the Attending Physician  Time spent : 25mns     Allison Ellis, AEdgemont ParkTriad Hospitalists Office  [phone] [phone] **If unable to reach the above provider after paging please contact the [email or web address] [phone] On-Call/Text Page:      [email or web address]      password [email or web address]  If 7PM-7AM, please contact night-coverage [email or web address] Password [email or web address] 12/04/2013, [name]:16  PM   LOS: 10 days   I have personally examined this patient and reviewed the entire database. I have reviewed the above note, made any necessary editorial changes, and agree with its content.  JCherene Altes MD [facility]

## 2013-12-04 NOTE — Progress Notes (Signed)
Patient ID: James [name], male   DOB: 23-May-1935, 79 y.o.   MRN: [id number]   SUBJECTIVE: Patient denies chest pain or dyspnea at rest.  There seems to be a degree of dementia versus mental slowing.  He diuresed quite well yesterday on IV Lasix and IV milrinone, weight is down 7 lbs.  CVP 2.  Creatinine stable. He is having a lot of ventricular and atrial ectopy on milrinone.   Scheduled Meds: . aspirin EC  81 mg Oral Daily  . atorvastatin  40 mg Oral q1800  . clopidogrel  75 mg Oral Q breakfast  . [START ON 12/05/2013] digoxin  0.125 mg Oral Daily  . digoxin  0.25 mg Oral Once  . furosemide  40 mg Oral BID  . levothyroxine  262 mcg Oral QAC breakfast  . lisinopril  2.5 mg Oral Daily  . potassium chloride  40 mEq Oral Once  . sodium chloride  10-40 mL Intracatheter Q12H  . sodium chloride  3 mL Intravenous Q12H  . spironolactone  12.5 mg Oral Daily  . spironolactone  12.5 mg Oral Daily   Continuous Infusions: . sodium chloride 5 mL/hr at 12/02/13 2000  . heparin 1,900 Units/hr (12/04/13 0135)  . milrinone 0.25 mcg/kg/min (12/04/13 0309)   PRN Meds:.sodium chloride, acetaminophen, ondansetron (ZOFRAN) IV, sodium chloride, sodium chloride    Filed Vitals:   12/04/13 0500 12/04/13 0600 12/04/13 0700 12/04/13 0800  BP: 111/60 124/52 129/55 112/63  Pulse: 95 46 46 87  Temp:    97.5 F (36.4 C)  TempSrc:    Oral  Resp: 26 28 31 26   Height: 5\' 9"  (1.753 m)     Weight: 187 lb 13.3 oz (85.2 kg)     SpO2: 97% 96% 95% 95%    Intake/Output Summary (Last 24 hours) at 12/04/13 0833 Last data filed at 12/04/13 0800  Gross per 24 hour  Intake 1031.4 ml  Output   3125 ml  Net -2093.6 ml    LABS: Basic Metabolic Panel:  Recent Labs  82/95/6201/10/19 0400 12/04/13 0430 12/04/13 0431  NA 140  --  135*  K 4.9  --  3.7  CL 100  --  94*  CO2 24  --  27  GLUCOSE 116*  --  112*  BUN 36*  --  34*  CREATININE 1.01  --  1.01  CALCIUM 9.7  --  9.2  MG  --  2.0  --    Liver Function  Tests: No results found for this basename: AST, ALT, ALKPHOS, BILITOT, PROT, ALBUMIN,  in the last 72 hours No results found for this basename: LIPASE, AMYLASE,  in the last 72 hours CBC:  Recent Labs  12/03/13 0400 12/04/13 0431  WBC 13.8* 12.3*  HGB 12.4* 12.7*  HCT 37.3* 37.1*  MCV 88.0 86.7  PLT 234 229   Cardiac Enzymes: No results found for this basename: CKTOTAL, CKMB, CKMBINDEX, TROPONINI,  in the last 72 hours BNP: No components found with this basename: POCBNP,  D-Dimer: No results found for this basename: DDIMER,  in the last 72 hours Hemoglobin A1C: No results found for this basename: HGBA1C,  in the last 72 hours Fasting Lipid Panel: No results found for this basename: CHOL, HDL, LDLCALC, TRIG, CHOLHDL, LDLDIRECT,  in the last 72 hours Thyroid Function Tests: No results found for this basename: TSH, T4TOTAL, FREET3, T3FREE, THYROIDAB,  in the last 72 hours Anemia Panel: No results found for this basename: VITAMINB12, FOLATE, FERRITIN, TIBC, IRON,  RETICCTPCT,  in the last 72 hours  RADIOLOGY: Dg Chest Port 1 View  11/26/2013   CLINICAL DATA:  Chest pain and congestive heart failure.  EXAM: PORTABLE CHEST - 1 VIEW  COMPARISON:  11/25/2013  FINDINGS: Previous median sternotomy and CABG procedure. The heart size is moderately enlarged. There is persistent airspace consolidation within the left upper lobe. Mild diffuse edema is identified elsewhere. Calcification within the left ventricular apex is noted which may be the sequelae of prior infarct  IMPRESSION: 1. Left upper lobe pneumonia. 2. Superimposed pulmonary edema compatible with CHF.   Electronically Signed   By: Signa Kell M.D.   On: 11/26/2013 10:55   Dg Chest Port 1 View  11/25/2013   CLINICAL DATA:  Congestive heart failure. Hypotension. Coronary artery disease.  EXAM: PORTABLE CHEST - 1 VIEW  COMPARISON:  11/24/13  FINDINGS: Left upper lobe airspace disease shows no significant interval change. Cardiomegaly  and pulmonary vascular congestion are stable. No evidence of pleural effusion. Prior CABG again noted.  IMPRESSION: No significant change in left upper lobe airspace disease, suspicious for pneumonia.   Electronically Signed   By: Myles Rosenthal M.D.   On: 11/25/2013 20:14    PHYSICAL EXAM General: NAD Neck: JVP not elevated, no thyromegaly or thyroid nodule.  Lungs: Clear to auscultation bilaterally with normal respiratory effort. CV: Nondisplaced PMI.  Heart regular S1/S2, no S3/S4, 1/6 HSM apex.  1+ edema to knee right leg.  No carotid bruit.   Abdomen: Soft, nontender, no hepatosplenomegaly, no distention.  Neurologic: Alert and oriented x 3.  Psych: Normal affect. Extremities: No clubbing or cyanosis.   TELEMETRY: Reviewed telemetry pt in NSR with PACs and PVCs.   ASSESSMENT AND PLAN: 79 yo with history of CAD s/p CABG and chronic systolic CHF was admitted with NSTEMI.  He also has acute on chronic systolic CHF with low output and was found to have a RLE DVT.   1. CAD: NSTEMI with complex disease in SVG-OM.  This is a potential target for intervention, but we have been managing significant volume overload prior to the procedure.   - Continue heparin gtt, ASA 81, Plavix, atorvastatin.  - Volume much improved, plan for PCI on Tuesday am.   2. Acute on chronic systolic CHF:  Ischemic cardiomyopathy.  Echo with EF 20-25%, diffuse hypokinesis, mild to moderate RV dysfunction, moderate pulmonary hypertension, moderate MR.  Low output with CVP 52% initially, up to 66% on milrinone with good diuresis.  CVP now down to 2 and weight down 7 lbs. Creatinine stable.  - Can switch Lasix over to po with good CVP, will use Lasix 40 mg po bid.  - With frequent ectopy and run of NSVT, would like to get him off milrinone.  Stop today, start digoxin.  - Add lisinopril 2.5 mg daily and spironolactone 12.5 mg daily.  - Follow co-ox and CVP.   3. RLE DVT: Continue heparin gtt, will need to transition to coumadin  versus NOAC eventually (after PCI).    Marca Ancona 12/04/2013 8:33 AM

## 2013-12-04 NOTE — Progress Notes (Signed)
ANTICOAGULATION CONSULT NOTE - Follow Up Consult  Pharmacy Consult for heparin Indication: chest pain/ACS and DVT  Allergies  Allergen Reactions  . Celecoxib     Unknown   . Codeine     Unknown   . Iohexol     Unknown     Patient Measurements: Height: 5\' 9"  (175.3 cm) Weight: 187 lb 13.3 oz (85.2 kg) IBW/kg (Calculated) : 70.7  Vital Signs: Temp: 98.5 F (36.9 C) (03/02 1600) Temp src: Oral (03/02 1600) BP: 81/33 mmHg (03/02 1624) Pulse Rate: 82 (03/02 1600)  Labs:  Recent Labs  12/02/13 0300 12/02/13 1520 12/03/13 0400 12/03/13 1400 12/04/13 0431 12/04/13 1500  HGB 12.2*  --  12.4*  --  12.7*  --   HCT 36.0*  --  37.3*  --  37.1*  --   PLT 216  --  234  --  229  --   HEPARINUNFRC 0.32  --  <0.10* 0.81* 0.30 0.34  CREATININE  --  1.00 1.01  --  1.01  --     Estimated Creatinine Clearance: 66.3 ml/min (by C-G formula based on Cr of 1.01).   Medications:  Infusions:  . sodium chloride 5 mL/hr at 12/02/13 2000  . heparin 2,000 Units/hr (12/04/13 1608)  . milrinone 0.125 mcg/kg/min (12/04/13 1650)    Assessment: 79 yo male on IV heparin for NSTEMI, also noted with new RLE DVT.  Awaiting return to cath lab tomorrow.  Heparin level remains therapeutic.  No bleeding or complications noted.  Goal of Therapy:  Heparin level 0.3-0.7 units/ml Monitor platelets by anticoagulation protocol: Yes   Plan:  1. Continue IV heparin at 2000 units/hr. 2. Continue daily heparin level and CBC. 3. F/U starting Coumadin or NOAC after cath lab tomorrow.  Christoper Fabianaron Kenden Brandt, PharmD, BCPS Clinical pharmacist, pager [phone]  12/04/2013 5:23 PM

## 2013-12-04 NOTE — Progress Notes (Signed)
Pt. With consistently low BP with systolic in the 50's and diastolic in the 20's.  Dr. Shirlee LatchMclean notified, fluid boluses given with no resolve.  Milrinone briefly started, but then d/c'd due to ectopy.  Dopamine started and patient's blood pressure improved to 106/57.  Will continue to monitor closely.

## 2013-12-04 NOTE — Progress Notes (Signed)
ANTICOAGULATION CONSULT NOTE - Follow Up Consult  Pharmacy Consult for heparin Indication: chest pain/ACS and DVT  Allergies  Allergen Reactions  . Celecoxib     Unknown   . Codeine     Unknown   . Iohexol     Unknown     Patient Measurements: Height: 5\' 9"  (175.3 cm) Weight: 187 lb 13.3 oz (85.2 kg) IBW/kg (Calculated) : 70.7 Heparin Dosing Weight: n/a  Vital Signs: Temp: 98.5 F (36.9 C) (03/02 1200) Temp src: Oral (03/02 1200) BP: 90/54 mmHg (03/02 1200) Pulse Rate: 93 (03/02 1200)  Labs:  Recent Labs  12/02/13 0300 12/02/13 1520 12/03/13 0400 12/03/13 1400 12/04/13 0431  HGB 12.2*  --  12.4*  --  12.7*  HCT 36.0*  --  37.3*  --  37.1*  PLT 216  --  234  --  229  HEPARINUNFRC 0.32  --  <0.10* 0.81* 0.30  CREATININE  --  1.00 1.01  --  1.01    Estimated Creatinine Clearance: 66.3 ml/min (by C-G formula based on Cr of 1.01).   Medications:  Infusions:  . sodium chloride 5 mL/hr at 12/02/13 2000  . heparin 2,000 Units/hr (12/04/13 0845)    Assessment: 79 yo male on IV heparin for NSTEMI, also noted with new RLE DVT.  Awaiting return to cath lab tomorrow.  Heparin level at lowest end of therapeutic range today.  No bleeding or complications noted.  Goal of Therapy:  Heparin level 0.3-0.7 units/ml Monitor platelets by anticoagulation protocol: Yes   Plan:  1. Increase IV heparin to 2000 units/hr. 2. Recheck heparin level in 6 hrs. 3. Continue daily heparin level and CBC. 4. F/U starting Coumadin or NOAC after cath lab tomorrow.  Tad MooreJessica Mahalie Kanner, Pharm D, BCPS  Clinical Pharmacist Pager [phone]  12/04/2013 1:39 PM

## 2013-12-05 ENCOUNTER — Encounter (HOSPITAL_COMMUNITY)
Admission: EM | Disposition: A | Discharge status: Skilled Nursing Facility | Payer: Self-pay | Source: Other Acute Inpatient Hospital | Attending: Internal Medicine

## 2013-12-05 DIAGNOSIS — I959 Hypotension, unspecified: Secondary | ICD-10-CM

## 2013-12-05 LAB — CBC
HCT: 36.9 % — ABNORMAL LOW (ref 39.0–52.0)
HEMOGLOBIN: 12.3 g/dL — AB (ref 13.0–17.0)
MCH: 28.9 pg (ref 26.0–34.0)
MCHC: 33.3 g/dL (ref 30.0–36.0)
MCV: 86.8 fL (ref 78.0–100.0)
Platelets: 251 10*3/uL (ref 150–400)
RBC: 4.25 MIL/uL (ref 4.22–5.81)
RDW: 13.3 % (ref 11.5–15.5)
WBC: 13.5 10*3/uL — ABNORMAL HIGH (ref 4.0–10.5)

## 2013-12-05 LAB — BASIC METABOLIC PANEL
BUN: 34 mg/dL — ABNORMAL HIGH (ref 6–23)
CALCIUM: 9 mg/dL (ref 8.4–10.5)
CHLORIDE: 100 meq/L (ref 96–112)
CO2: 25 meq/L (ref 19–32)
Creatinine, Ser: 0.61 mg/dL (ref 0.50–1.35)
GFR calc Af Amer: >90 mL/min (ref >90)
GFR calc non Af Amer: >90 mL/min (ref >90)
Glucose, Bld: 112 mg/dL — ABNORMAL HIGH (ref 70–99)
Potassium: 3.9 mEq/L (ref 3.7–5.3)
SODIUM: 139 meq/L (ref 137–147)

## 2013-12-05 LAB — CARBOXYHEMOGLOBIN
Carboxyhemoglobin: 2.1 % — ABNORMAL HIGH (ref 0.5–1.5)
Methemoglobin: 1.4 % (ref 0.0–1.5)
O2 Saturation: 74.7 %
Total hemoglobin: 14.9 g/dL (ref 13.5–18.0)

## 2013-12-05 LAB — HEPARIN LEVEL (UNFRACTIONATED): Heparin Unfractionated: 0.54 IU/mL (ref 0.30–0.70)

## 2013-12-05 SURGERY — PERCUTANEOUS STENT INTERVENTION
Anesthesia: LOCAL

## 2013-12-05 MED ORDER — DIPHENHYDRAMINE HCL 50 MG/ML IJ SOLN
INTRAMUSCULAR | Status: AC
  Administered 2013-12-05: 25 mg
  Filled 2013-12-05: qty 1

## 2013-12-05 MED ORDER — DIPHENHYDRAMINE HCL 50 MG/ML IJ SOLN
25.0000 mg | INTRAMUSCULAR | Status: AC
  Administered 2013-12-05: 25 mg via INTRAVENOUS

## 2013-12-05 MED ORDER — METHYLPREDNISOLONE SODIUM SUCC 125 MG IJ SOLR
INTRAMUSCULAR | Status: AC
  Filled 2013-12-05: qty 2

## 2013-12-05 MED ORDER — SODIUM CHLORIDE 0.9 % IV SOLN
INTRAVENOUS | Status: AC
  Administered 2013-12-05: 10:00:00 via INTRAVENOUS

## 2013-12-05 MED ORDER — FAMOTIDINE IN NACL 20-0.9 MG/50ML-% IV SOLN
20.0000 mg | INTRAVENOUS | Status: DC
  Filled 2013-12-05: qty 50

## 2013-12-05 MED ORDER — SODIUM CHLORIDE 0.9 % IV SOLN
Freq: Once | INTRAVENOUS | Status: AC
  Administered 2013-12-05: 09:00:00 via INTRAVENOUS

## 2013-12-05 MED ORDER — METHYLPREDNISOLONE SODIUM SUCC 125 MG IJ SOLR
125.0000 mg | INTRAMUSCULAR | Status: AC
  Administered 2013-12-05: 125 mg via INTRAVENOUS

## 2013-12-05 NOTE — Progress Notes (Signed)
As per cardiology, maintain systolic bp- 80's , avoid increasing the rate of dopamine,. Continue to monitor.

## 2013-12-05 NOTE — Progress Notes (Signed)
Patient ID: OM LIZOTTE, male   DOB: 1935-04-23, 79 y.o.   MRN: [id number]    SUBJECTIVE: Patient denies chest pain or dyspnea at rest.  There seems to be a degree of dementia versus mental slowing.  CVP down to 2-4.  Patient's BP dropped yesterday after milrinone stopped.  We initially put him back on milrinone but he had a lot of ectopy.  We ended up starting dopamine, which he remains on today.   BP still running on the low side this morning (SBP to 70s at times).   Scheduled Meds: . aspirin EC  81 mg Oral Daily  . atorvastatin  40 mg Oral q1800  . clopidogrel  75 mg Oral Q breakfast  . digoxin  0.125 mg Oral Daily  . famotidine (PEPCID) IV  20 mg Intravenous Pre-Cath  . levothyroxine  262 mcg Oral QAC breakfast  . sodium chloride  10-40 mL Intracatheter Q12H  . sodium chloride  3 mL Intravenous Q12H  . sodium chloride  3 mL Intravenous Q12H  . spironolactone  12.5 mg Oral Daily   Continuous Infusions: . sodium chloride 5 mL/hr at 12/02/13 2000  . sodium chloride    . DOPamine 7 mcg/kg/min (12/05/13 0800)  . heparin 2,000 Units/hr (12/05/13 0459)   PRN Meds:.sodium chloride, sodium chloride, acetaminophen, ondansetron (ZOFRAN) IV, sodium chloride, sodium chloride, sodium chloride    Filed Vitals:   12/05/13 0400 12/05/13 0500 12/05/13 0600 12/05/13 0700  BP: 94/58 98/62 96/44  92/53  Pulse:      Temp: 99 F (37.2 C)     TempSrc: Oral     Resp: 29 23 27 26   Height:      Weight:  86.2 kg (190 lb 0.6 oz)    SpO2: 96% 92% 95% 98%    Intake/Output Summary (Last 24 hours) at 12/05/13 0813 Last data filed at 12/05/13 0800  Gross per 24 hour  Intake 947.61 ml  Output   2050 ml  Net -1102.39 ml    LABS: Basic Metabolic Panel:  Recent Labs  13/08/65 0430 12/04/13 0431 12/05/13 0500  NA  --  135* 139  K  --  3.7 3.9  CL  --  94* 100  CO2  --  27 25  GLUCOSE  --  112* 112*  BUN  --  34* 34*  CREATININE  --  1.01 0.61  CALCIUM  --  9.2 9.0  MG 2.0  --   --     Liver Function Tests: No results found for this basename: AST, ALT, ALKPHOS, BILITOT, PROT, ALBUMIN,  in the last 72 hours No results found for this basename: LIPASE, AMYLASE,  in the last 72 hours CBC:  Recent Labs  12/04/13 0431 12/05/13 0500  WBC 12.3* 13.5*  HGB 12.7* 12.3*  HCT 37.1* 36.9*  MCV 86.7 86.8  PLT 229 251   Cardiac Enzymes: No results found for this basename: CKTOTAL, CKMB, CKMBINDEX, TROPONINI,  in the last 72 hours BNP: No components found with this basename: POCBNP,  D-Dimer: No results found for this basename: DDIMER,  in the last 72 hours Hemoglobin A1C: No results found for this basename: HGBA1C,  in the last 72 hours Fasting Lipid Panel: No results found for this basename: CHOL, HDL, LDLCALC, TRIG, CHOLHDL, LDLDIRECT,  in the last 72 hours Thyroid Function Tests: No results found for this basename: TSH, T4TOTAL, FREET3, T3FREE, THYROIDAB,  in the last 72 hours Anemia Panel: No results found for this basename: VITAMINB12, FOLATE,  FERRITIN, TIBC, IRON, RETICCTPCT,  in the last 72 hours  RADIOLOGY: Dg Chest Port 1 View  11/26/2013   CLINICAL DATA:  Chest pain and congestive heart failure.  EXAM: PORTABLE CHEST - 1 VIEW  COMPARISON:  11/25/2013  FINDINGS: Previous median sternotomy and CABG procedure. The heart size is moderately enlarged. There is persistent airspace consolidation within the left upper lobe. Mild diffuse edema is identified elsewhere. Calcification within the left ventricular apex is noted which may be the sequelae of prior infarct  IMPRESSION: 1. Left upper lobe pneumonia. 2. Superimposed pulmonary edema compatible with CHF.   Electronically Signed   By: Signa Kellaylor  Stroud M.D.   On: 11/26/2013 10:55   Dg Chest Port 1 View  11/25/2013   CLINICAL DATA:  Congestive heart failure. Hypotension. Coronary artery disease.  EXAM: PORTABLE CHEST - 1 VIEW  COMPARISON:  11/24/13  FINDINGS: Left upper lobe airspace disease shows no significant interval  change. Cardiomegaly and pulmonary vascular congestion are stable. No evidence of pleural effusion. Prior CABG again noted.  IMPRESSION: No significant change in left upper lobe airspace disease, suspicious for pneumonia.   Electronically Signed   By: [name]  Stahl M.D.   On: 11/25/2013 20:14    PHYSICAL EXAM General: NAD Neck: JVP not elevated, no thyromegaly or thyroid nodule.  Lungs: Clear to auscultation bilaterally with normal respiratory effort. CV: Nondisplaced PMI.  Heart regular S1/S2, no S3/S4, 1/6 HSM apex.  1+ edema to knee right leg.  No carotid bruit.   Abdomen: Soft, nontender, no hepatosplenomegaly, no distention.  Neurologic: Alert and oriented x 3.  Psych: Normal affect. Extremities: No clubbing or cyanosis.   TELEMETRY: Reviewed telemetry pt in NSR   ASSESSMENT AND PLAN: 79 yo with history of CAD s/p CABG and chronic systolic CHF was admitted with NSTEMI.  He also has acute on chronic systolic CHF with low output and was found to have a RLE DVT.   1. CAD: NSTEMI with complex disease in SVG-OM.  This is a potential target for intervention, but we have been managing significant volume overload prior to the procedure and now hypotension.   - Continue heparin gtt, ASA 81, Plavix, atorvastatin.  - Volume improved with CVP 2-4 but now hypotensive.  I am going to postpone PCI until Wednesday.  Discussed with Dr. [place].    2. Acute on chronic systolic CHF:  Ischemic cardiomyopathy.  Echo with EF 20-25%, diffuse hypokinesis, mild to moderate RV dysfunction, moderate pulmonary hypertension, moderate MR.  Low output with CVP 52% initially, up to 66% on milrinone with good diuresis.  CVP now down to 2-4 with stable creatinine.  However, he now is hypotensive and on dopamine.  We suspect we overshot his diuresis (also got low dose lisinopril yesterday).  Will give NS @ 100 cc/hr x 2 hours and continue dopamine today.  Hold Lasix today. Continue digoxin and spironolactone. Lisinopril was  stopped. Good co-ox this morning. 3. RLE DVT: Continue heparin gtt, will need to transition to coumadin versus NOAC eventually (after PCI).    Marca AnconaDalton Marveline Profeta 12/05/2013 8:13 AM

## 2013-12-05 NOTE — Progress Notes (Addendum)
ANTICOAGULATION CONSULT NOTE - Follow Up Consult  Pharmacy Consult for heparin Indication: chest pain/ACS and DVT  Allergies  Allergen Reactions  . Celecoxib     Unknown   . Codeine     Unknown   . Iohexol     Unknown     Patient Measurements: Height: 5\' 9"  (175.3 cm) Weight: 190 lb 0.6 oz (86.2 kg) IBW/kg (Calculated) : 70.7  Vital Signs: Temp: 99 F (37.2 C) (03/03 0400) Temp src: Oral (03/03 0400) BP: 96/44 mmHg (03/03 0600)  Labs:  Recent Labs  12/03/13 0400  12/04/13 0431 12/04/13 1500 12/05/13 0500  HGB 12.4*  --  12.7*  --  12.3*  HCT 37.3*  --  37.1*  --  36.9*  PLT 234  --  229  --  251  HEPARINUNFRC <0.10*  < > 0.30 0.34 0.54  CREATININE 1.01  --  1.01  --  0.61  < > = values in this interval not displayed.  Estimated Creatinine Clearance: 84.1 ml/min (by C-G formula based on Cr of 0.61).   Medications:  Infusions:  . sodium chloride 5 mL/hr at 12/02/13 2000  . DOPamine 6 mcg/kg/min (12/04/13 2000)  . heparin 2,000 Units/hr (12/05/13 0459)  . milrinone 0.125 mcg/kg/min (12/04/13 1650)    Assessment: 79 yo male on IV heparin for NSTEMI, also noted with new RLE DVT.  Awaiting return to cath lab tomorrow.  Heparin level remains therapeutic.  No bleeding or complications noted.  Goal of Therapy:  Heparin level 0.3-0.7 units/ml Monitor platelets by anticoagulation protocol: Yes   Plan:  1. Continue IV heparin at 2000 units/hr. 2. Continue daily heparin level and CBC. 3. F/U starting Coumadin or NOAC after cath lab tomorrow.  Tad MooreJessica Philmore Lepore, Pharm D, BCPS  Clinical Pharmacist Pager [phone]  12/05/2013 7:54 AM

## 2013-12-05 NOTE — Progress Notes (Signed)
[name]  James [name] YHC:[id number] DOB: 1935-04-15 DOA: 11/24/2013 PCP: [name], DO  Brief narrative: 79 year old male with history of CAD status post CABG as well as congestive heart failure, grade 1 diastolic and systolic CHF with a decreased ejection fraction of 40% who was transferred from [facility] emergency room after presenting there on 2/20 with complaints of shortness of breath and lower extremity swelling x2 days. He was found to have a troponin of 3.5 and chest x-ray noted pneumonia and congestive heart failure so patient was transferred to the hospitalist service and placed in the step down unit. Patient's cardiologist is Dr. Percival Spanish of [facility] heart care and was consulted.   Attempts at diuresing were met with hypotension;  The patient's blood pressure dropped as low as 50s. Critical care was consulted initially for possible dopamine involvement, however patient did respond to gentle fluid boluses. As troponins were trending down, pneumonia treated, and blood pressure stabilized, patient's mentation improved. He is felt to be stable and plans are for cardiac catheterization on 2/24.  Assessment/Plan:  Acute respiratory failure with hypoxia :   A) Ischemic CM / Acute on chronic systolic heart failure   B) Left upper lobe pneumonia v/s pulmonary infarct  -had recurrent hypotension 2/23 that required fluid boluses -recently had OP meds adjusted due to hypotension -Lasix decreased to 20 mg daily 2/25 but volume overload persisted so Cards increased IV Lasix -cont to hold BB and ACE I 2/2 recent hypotension -need to clarify if PE since if PE anticoagulation would be 6 months as opposed to 3 months -Antibiotics discontinued and are following clinical exam -Milrinone added by Cardiology but after weaned pt developed hypotension -did not tolerate when resumed 2/2 arrhythmia so Dopamine used to keep SBP>80-85 -Management of heart failure  and ischemia per Cardiology team-encouraged by increasing co-ox  DVT of right lower extremity  -new finding this admission -already on IV Heparin for cardiac ischemia as well -have asked CM to clarify co pay of NOAC  CAD/NSTEMI  -per Cards -cath 2/25:diffuse dz circumflex and stenosis vein graft right-interventional decision pending -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 -Repeat catheterization planned once patient adequately diuresed- tentative plan for 12/06/13 due to recurrent hypotension  Persistent leukocytosis -Suspect related to recanalization in acute DVT right lower extremity noting mild erythema has slightly worsened right lower extremity but this is not consistent with a cellulitic process  Hypotension -recurrent and seems r/t meds not sepsis (see above)  CKD  stage II -renal fnx stable post cath  HYPERLIPIDEMIA  Hx of Essential hypertension, benign -BP soft - usual meds on hold  Hypothyroidism -cont Synthroid  Acute delirium -resolving  DVT prophylaxis: IV heparin Code Status: Full Family Communication: No family at bedside  Disposition Plan/Expected LOS: Stepdown  Consultants: Cardiology  Procedures: 2-D echocardiogram  - Procedure narrative: Transthoracic echocardiography. Image quality was adequate. The study was technically difficult, as a result of poor acoustic windows and poor sound wave transmission. Intravenous contrast (Definity) was administered. - Left ventricle: The cavity size was severely dilated. Wall thickness was normal. Systolic function was severely reduced. The estimated ejection fraction was in the range of 20% to 25%. Diffuse hypokinesis. Indeterminant diastolic function. - Aortic valve: There was no stenosis. Trivial regurgitation. - Mitral valve: Mildly calcified annulus. Mildly calcified leaflets . Moderate central regurgitation. - Left atrium: The atrium was moderately dilated. - Right ventricle: The  cavity size was mildly to  moderately dilated. Systolic function was mildly reduced. - Right atrium: The atrium was mildly dilated. - Tricuspid valve: Peak RV-RA gradient: 31m Hg (S). - Pulmonary arteries: PA peak pressure: 678mHg (S). - Systemic veins: IVC measured 2.2 cm with < 50% respirophasic variation, suggesting RA pressure 15 mmHg.  Ankle-brachial index pending  Lower extremity venous duplex  preliminary results consistent with right lower extremity DVT involving posterior tibial and peroneal veins  Antibiotics: Levaquin 2/22 >>>2/25  HPI/Subjective: Sleepy but awakens. Seems confused but no complaints  Objective: Blood pressure 105/65, pulse 94, temperature 98.9 F (37.2 C), temperature source Oral, resp. rate 26, height 5' 9"  (1.753 m), weight 190 lb 0.6 oz (86.2 kg), SpO2 96.00%.  Intake/Output Summary (Last 24 hours) at 12/05/13 1322 Last data filed at 12/05/13 1300  Gross per 24 hour  Intake 1633.1 ml  Output   1150 ml  Net  483.1 ml   Exam: General: No acute respiratory distress Lungs: Clear to auscultation bilaterally without wheezes or crackles,  2 L Cardiovascular: Regular rate and rhythm without murmur gallop or rub normal S1 and S2, unilateral right lower extremity peripheral edema 2+ previous mild erythematous changes have resolved, SBP soft- Dopamine gtt Abdomen: Nontender, nondistended, soft, bowel sounds positive, no rebound, no ascites, no appreciable mass Musculoskeletal: No significant cyanosis, clubbing of bilateral lower extremities-dressing over R groin post cath   Scheduled Meds:  Scheduled Meds: . aspirin EC  81 mg Oral Daily  . atorvastatin  40 mg Oral q1800  . clopidogrel  75 mg Oral Q breakfast  . digoxin  0.125 mg Oral Daily  . famotidine (PEPCID) IV  20 mg Intravenous Pre-Cath  . levothyroxine  262 mcg Oral QAC breakfast  . sodium chloride  10-40 mL Intracatheter Q12H  . sodium chloride  3 mL Intravenous Q12H  . sodium chloride  3  mL Intravenous Q12H  . spironolactone  12.5 mg Oral Daily   Continuous Infusions: . sodium chloride 5 mL/hr at 12/02/13 2000  . DOPamine 5 mcg/kg/min (12/05/13 1100)  . heparin 2,000 Units/hr (12/05/13 0459)    Data Reviewed: Basic Metabolic Panel:  Recent Labs Lab 12/01/13 0225 12/02/13 1520 12/03/13 0400 12/04/13 0430 12/04/13 0431 12/05/13 0500  NA 141 136* 140  --  135* 139  K 3.5* 4.1 4.9  --  3.7 3.9  CL 101 96 100  --  94* 100  CO2 26 23 24   --  27 25  GLUCOSE 124* 120* 116*  --  112* 112*  BUN 34* 35* 36*  --  34* 34*  CREATININE 1.04 1.00 1.01  --  1.01 0.61  CALCIUM 9.1 9.0 9.7  --  9.2 9.0  MG  --   --   --  2.0  --   --    Liver Function Tests: No results found for this basename: AST, ALT, ALKPHOS, BILITOT, PROT, ALBUMIN,  in the last 168 hours  CBC:  Recent Labs Lab 12/01/13 0225 12/02/13 0300 12/03/13 0400 12/04/13 0431 12/05/13 0500  WBC 12.5* 11.8* 13.8* 12.3* 13.5*  HGB 12.4* 12.2* 12.4* 12.7* 12.3*  HCT 37.3* 36.0* 37.3* 37.1* 36.9*  MCV 88.6 88.0 88.0 86.7 86.8  PLT 233 216 234 229 251    No results found for this or any previous visit (from the past 240 hour(s)).   Studies:  Recent x-ray studies have been reviewed in detail by the Attending Physician  Time spent : 2589m     Allison Ellis, ANP [facility] Office  [phone] Pager [phone]  **If unable to reach the above provider after paging please contact the Flow Manager @ [phone]  On-Call/Text Page:      [email or web address]      password [email or web address]  If 7PM-7AM, please contact night-coverage [email or web address] Password [email or web address] 12/05/2013, 1:22 PM   LOS: 11 days     I have examined the patient, reviewed the chart and modified the above note which I agree with.   [email or web address],GN [phone] 12/05/2013, 2:33 PM

## 2013-12-06 ENCOUNTER — Encounter (HOSPITAL_COMMUNITY)
Admission: EM | Disposition: A | Discharge status: Skilled Nursing Facility | Payer: Medicare Other | Source: Other Acute Inpatient Hospital | Attending: Internal Medicine

## 2013-12-06 DIAGNOSIS — R4182 Altered mental status, unspecified: Secondary | ICD-10-CM

## 2013-12-06 DIAGNOSIS — I25810 Atherosclerosis of coronary artery bypass graft(s) without angina pectoris: Secondary | ICD-10-CM

## 2013-12-06 HISTORY — PX: PERCUTANEOUS CORONARY STENT INTERVENTION (PCI-S): SHX5485

## 2013-12-06 LAB — CBC
HCT: 36.9 % — ABNORMAL LOW (ref 39.0–52.0)
Hemoglobin: 12.3 g/dL — ABNORMAL LOW (ref 13.0–17.0)
MCH: 29 pg (ref 26.0–34.0)
MCHC: 33.3 g/dL (ref 30.0–36.0)
MCV: 87 fL (ref 78.0–100.0)
Platelets: 292 10*3/uL (ref 150–400)
RBC: 4.24 MIL/uL (ref 4.22–5.81)
RDW: 13.3 % (ref 11.5–15.5)
WBC: 15.9 10*3/uL — AB (ref 4.0–10.5)

## 2013-12-06 LAB — POCT ACTIVATED CLOTTING TIME: ACTIVATED CLOTTING TIME: 575 s

## 2013-12-06 LAB — BASIC METABOLIC PANEL
BUN: 33 mg/dL — ABNORMAL HIGH (ref 6–23)
CALCIUM: 9.2 mg/dL (ref 8.4–10.5)
CO2: 23 mEq/L (ref 19–32)
Chloride: 98 mEq/L (ref 96–112)
Creatinine, Ser: 0.3 mg/dL — ABNORMAL LOW (ref 0.50–1.35)
GFR calc non Af Amer: >90 mL/min (ref >90)
GLUCOSE: 130 mg/dL — AB (ref 70–99)
Potassium: 4.1 mEq/L (ref 3.7–5.3)
SODIUM: 136 meq/L — AB (ref 137–147)

## 2013-12-06 LAB — PROTIME-INR
INR: 1.25 (ref 0.00–1.49)
Prothrombin Time: 15.4 seconds — ABNORMAL HIGH (ref 11.6–15.2)

## 2013-12-06 LAB — APTT: APTT: 112 s — AB (ref 24–37)

## 2013-12-06 LAB — CARBOXYHEMOGLOBIN
Carboxyhemoglobin: 1.8 % — ABNORMAL HIGH (ref 0.5–1.5)
Methemoglobin: 0.7 % (ref 0.0–1.5)
O2 SAT: 93.5 %
Total hemoglobin: 12.4 g/dL — ABNORMAL LOW (ref 13.5–18.0)

## 2013-12-06 LAB — HEPARIN LEVEL (UNFRACTIONATED)
Heparin Unfractionated: 0.91 IU/mL — ABNORMAL HIGH (ref 0.30–0.70)
Heparin Unfractionated: 0.8 IU/mL — ABNORMAL HIGH (ref 0.30–0.70)

## 2013-12-06 SURGERY — PERCUTANEOUS CORONARY STENT INTERVENTION (PCI-S)
Anesthesia: LOCAL

## 2013-12-06 MED ORDER — METHYLPREDNISOLONE SODIUM SUCC 125 MG IJ SOLR: 125.0000 mg | INTRAMUSCULAR | Status: DC

## 2013-12-06 MED ORDER — LIDOCAINE-EPINEPHRINE 1 %-1:100000 IJ SOLN
INTRAMUSCULAR | Status: AC
  Filled 2013-12-06: qty 1

## 2013-12-06 MED ORDER — LIDOCAINE HCL (PF) 1 % IJ SOLN
INTRAMUSCULAR | Status: AC
  Filled 2013-12-06: qty 30

## 2013-12-06 MED ORDER — BIVALIRUDIN 250 MG IV SOLR
INTRAVENOUS | Status: AC
  Filled 2013-12-06: qty 250

## 2013-12-06 MED ORDER — DIPHENHYDRAMINE HCL 50 MG/ML IJ SOLN
25.0000 mg | INTRAMUSCULAR | Status: AC
  Administered 2013-12-06: 25 mg via INTRAVENOUS
  Filled 2013-12-06: qty 1

## 2013-12-06 MED ORDER — HEPARIN (PORCINE) IN NACL 2-0.9 UNIT/ML-% IJ SOLN
INTRAMUSCULAR | Status: AC
  Filled 2013-12-06: qty 1500

## 2013-12-06 MED ORDER — RIVAROXABAN 15 MG PO TABS
15.0000 mg | ORAL_TABLET | Freq: Two times a day (BID) | ORAL | Status: DC
  Administered 2013-12-06 – 2013-12-14 (×16): 15 mg via ORAL
  Filled 2013-12-06 (×20): qty 1

## 2013-12-06 MED ORDER — ASPIRIN 81 MG PO CHEW: 81.0000 mg | CHEWABLE_TABLET | ORAL | Status: AC

## 2013-12-06 MED ORDER — SODIUM CHLORIDE 0.9 % IV SOLN: INTRAVENOUS | Status: AC

## 2013-12-06 MED ORDER — SODIUM CHLORIDE 0.9 % IV SOLN
250.0000 mL | INTRAVENOUS | Status: DC | PRN
Start: 2013-12-06 — End: 2013-12-06

## 2013-12-06 MED ORDER — NITROGLYCERIN 0.2 MG/ML ON CALL CATH LAB
INTRAVENOUS | Status: AC
  Filled 2013-12-06: qty 1

## 2013-12-06 MED ORDER — SODIUM CHLORIDE 0.9 % IJ SOLN: 3.0000 mL | Freq: Two times a day (BID) | INTRAMUSCULAR | Status: DC

## 2013-12-06 MED ORDER — FAMOTIDINE IN NACL 20-0.9 MG/50ML-% IV SOLN
20.0000 mg | Freq: Once | INTRAVENOUS | Status: AC
  Administered 2013-12-06: 20 mg via INTRAVENOUS
  Filled 2013-12-06: qty 50

## 2013-12-06 MED ORDER — SODIUM CHLORIDE 0.9 % IJ SOLN: 3.0000 mL | INTRAMUSCULAR | Status: DC | PRN

## 2013-12-06 NOTE — Progress Notes (Signed)
NUTRITION FOLLOW UP  Intervention:   1.  General healthful diet; encourage intake of foods and beverages as able.  RD to follow and assess for nutritional adequacy.   Nutrition Dx:   Inadequate oral intake, improving.   Monitor:   1.  Food/Beverage; pt meeting >/=90% estimated needs with tolerance.  Likely met with adequate intake. 2.  Wt/wt change; monitor trends. Overall stable  Assessment:   Patient with PMH of CAD, ischemic cardiomyopathy and chronic systolic CHF who was seen at [facility] ED due to complaints of SOB, and worsening swelling of his lower legs. He denied having any chest pain or fever or chills. He was evaluated in the ED and was found to have a +Troponin level of 3.5, and changes consisted with CHF and possible pneumonia on chest X-ray.  Pt currently in cath lab.  NPO for procedure, however he continues to eat well at 50-80% of meals. No nutrition-related concerns.   Expect diet to be resume s/p cath recovery.  Wt variable; overall 190-195 lbs  Height: Ht Readings from Last 1 Encounters:  12/04/13 _0  (1.753 m)    Weight Status:   Wt Readings from Last 1 Encounters:  12/06/13 182 lb 8.7 oz (82.8 kg)    Re-estimated needs:  Kcal: 1950-2150  Protein: 95-105 gm  Fluid: per MD  Skin: intact  Diet Order: NPO   Intake/Output Summary (Last 24 hours) at 12/06/13 1501 Last data filed at 12/06/13 1300  Gross per 24 hour  Intake   1094 ml  Output    650 ml  Net    444 ml   Last BM: 2/28  Labs:   Recent Labs Lab 12/04/13 0430 12/04/13 0431 12/05/13 0500 12/06/13 0557  NA  --  135* 139 136*  K  --  3.7 3.9 4.1  CL  --  94* 100 98  CO2  --  _1 BUN  --  34* 34* 33*  CREATININE  --  1.01 0.61 0.30*  CALCIUM  --  9.2 9.0 9.2  MG 2.0  --   --   --   GLUCOSE  --  112* 112* 130*    CBG (last 3)  No results found for this basename: GLUCAP,  in the last 72 hours  Scheduled Meds: . aspirin EC  81 mg Oral Daily  . atorvastatin  40 mg Oral  q1800  . clopidogrel  75 mg Oral Q breakfast  . digoxin  0.125 mg Oral Daily  . levothyroxine  262 mcg Oral QAC breakfast  . methylPREDNISolone (SOLU-MEDROL) injection  125 mg Intravenous Pre-Cath  . sodium chloride  10-40 mL Intracatheter Q12H  . sodium chloride  3 mL Intravenous Q12H  . sodium chloride  3 mL Intravenous Q12H  . sodium chloride  3 mL Intravenous Q12H  . spironolactone  12.5 mg Oral Daily    Continuous Infusions: . sodium chloride 5 mL/hr at 12/02/13 2000  . DOPamine 1 mcg/kg/min (12/06/13 1200)  . heparin Stopped (12/06/13 1330)    Brynda Greathouse, MS RD LDN Clinical Inpatient Dietitian Pager: [phone] Weekend/After hours pager: [phone]

## 2013-12-06 NOTE — Progress Notes (Signed)
PT Cancellation Note  Patient Details Name: James CommentRobert R Jarboe MRN: [id number] DOB: [birth year]   Cancelled Treatment:    Reason Eval/Treat Not Completed: Patient not medically ready;Other ([name]) (pt just back from cardiac cath).    Thanks,  Rollene Rotundaebecca B. Caison Hearn, PT, DPT [phone]   12/06/2013, 3:19 PM

## 2013-12-06 NOTE — Progress Notes (Signed)
[facility]  [name] NOM:[id number] DOB: 1935-08-12 DOA: 11/24/2013 PCP: James Graves, DO  Brief narrative: 79 year old male with history of CAD status post CABG as well as congestive heart failure, grade 1 diastolic and systolic CHF with a decreased ejection fraction of 40% who was transferred from [facility] emergency room after presenting there on 2/20 with complaints of shortness of breath and lower extremity swelling x2 days. He was found to have a troponin of 3.5 and chest x-ray noted pneumonia and congestive heart failure so patient was transferred to the hospitalist service and placed in the step down unit. Patient's cardiologist is Dr. Percival [name] of [facility] heart care and was consulted.   Attempts at diuresing were met with hypotension;  The patient's blood pressure dropped as low as 50s. Critical care was consulted initially for possible dopamine, however patient did respond to gentle fluid boluses. As troponins were trending down, pneumonia treated, and blood pressure stabilized, patient's mentation improved.   Assessment/Plan:  Acute respiratory failure with hypoxia :   A) Ischemic CM / Acute on chronic systolic heart failure   B) Left upper lobe pneumonia v/s pulmonary infarct  -diuresis has been limited by hypotension-likely over diuresed recently -need to clarify if PE since if PE anticoagulation would be 6 months as opposed to 3 months -Antibiotics discontinued  -Milrinone added by Cardiology but after weaned pt developed hypotension -did not tolerate when resumed 2/2 arrhythmia so Dopamine used to keep SBP>80-85-now being titrated down -Management of heart failure and ischemia per Cardiology team  DVT of right lower extremity  -new finding this admission -on IV Heparin for cardiac ischemia as well -have asked CM to clarify co pay of NOAC  CAD/NSTEMI  -per Cards -cath 2/25:diffuse dz circumflex and stenosis vein graft  right-interventional decision pending -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 -cath today for PCI SVG-OM  Persistent leukocytosis -no clear evidence of infection at this time - follow trend   Hypotension -recurrent and seems r/t meds not sepsis (see above)  CKD  stage II -renal fnx stable post cath  HYPERLIPIDEMIA  Hx of Essential hypertension, benign -BP soft - usual meds on hold  Hypothyroidism -cont Synthroid  Acute delirium -resolving  DVT prophylaxis: IV heparin Code Status: Full Family Communication: cousin updated at bedside Disposition Plan/Expected LOS: Stepdown  Consultants: Cardiology  Procedures: 2-D echocardiogram  - Procedure narrative: Transthoracic echocardiography. Image quality was adequate. The study was technically difficult, as a result of poor acoustic windows and poor sound wave transmission. Intravenous contrast (Definity) was administered. - Left ventricle: The cavity size was severely dilated. Wall thickness was normal. Systolic function was severely reduced. The estimated ejection fraction was in the range of 20% to 25%. Diffuse hypokinesis. Indeterminant diastolic function. - Aortic valve: There was no stenosis. Trivial regurgitation. - Mitral valve: Mildly calcified annulus. Mildly calcified leaflets . Moderate central regurgitation. - Left atrium: The atrium was moderately dilated. - Right ventricle: The cavity size was mildly to moderately dilated. Systolic function was mildly reduced. - Right atrium: The atrium was mildly dilated. - Tricuspid valve: Peak RV-RA gradient: 71m Hg (S). - Pulmonary arteries: PA peak pressure: 63mHg (S). - Systemic veins: IVC measured 2.2 cm with < 50% respirophasic variation, suggesting RA pressure 15 mmHg.  Ankle-brachial index  Left ABI suggest mild, borderline arterial insufficiency. Unable to obtain right lower extremity pressures due to acute DVT. Unable to evaluate the  right brachial artery  due to bandaging.  Lower extremity venous duplex  preliminary results consistent with right lower extremity DVT involving posterior tibial and peroneal veins  Antibiotics: Levaquin 2/22 >>>2/25  HPI/Subjective: Alert-no complaints.  No cp or sob.    Objective: Blood pressure 93/59, pulse 97, temperature 97.9 F (36.6 C), temperature source Oral, resp. rate 20, height 5' 9"  (1.753 m), weight 182 lb 8.7 oz (82.8 kg), SpO2 98.00%.  Intake/Output Summary (Last 24 hours) at 12/06/13 1243 Last data filed at 12/06/13 1200  Gross per 24 hour  Intake   1290 ml  Output    650 ml  Net    640 ml   Exam: General: No acute respiratory distress Lungs: Clear to auscultation bilaterally without wheezes or crackles,  2 L Cardiovascular: Regular rate and rhythm without murmur gallop or rub normal S1 and S2, unilateral right lower extremity peripheral edema 2+ previous mild erythematous changes have resolved, SBP better- Dopamine gtt weaning Abdomen: Nontender, nondistended, soft, bowel sounds positive, no rebound, no ascites, no appreciable mass Musculoskeletal: No significant cyanosis, clubbing of bilateral lower extremities-dressing over R groin post cath   Scheduled Meds:  Scheduled Meds: . aspirin EC  81 mg Oral Daily  . atorvastatin  40 mg Oral q1800  . clopidogrel  75 mg Oral Q breakfast  . digoxin  0.125 mg Oral Daily  . diphenhydrAMINE  25 mg Intravenous Pre-Cath  . famotidine (PEPCID) IV  20 mg Intravenous Once  . levothyroxine  262 mcg Oral QAC breakfast  . methylPREDNISolone (SOLU-MEDROL) injection  125 mg Intravenous Pre-Cath  . sodium chloride  10-40 mL Intracatheter Q12H  . sodium chloride  3 mL Intravenous Q12H  . sodium chloride  3 mL Intravenous Q12H  . sodium chloride  3 mL Intravenous Q12H  . spironolactone  12.5 mg Oral Daily   Continuous Infusions: . sodium chloride 5 mL/hr at 12/02/13 2000  . DOPamine 1 mcg/kg/min (12/06/13 1200)  . heparin  1,800 Units/hr (12/06/13 0800)    Data Reviewed: Basic Metabolic Panel:  Recent Labs Lab 12/02/13 1520 12/03/13 0400 12/04/13 0430 12/04/13 0431 12/05/13 0500 12/06/13 0557  NA 136* 140  --  135* 139 136*  K 4.1 4.9  --  3.7 3.9 4.1  CL 96 100  --  94* 100 98  CO2 23 24  --  27 25 23   GLUCOSE 120* 116*  --  112* 112* 130*  BUN 35* 36*  --  34* 34* 33*  CREATININE 1.00 1.01  --  1.01 0.61 0.30*  CALCIUM 9.0 9.7  --  9.2 9.0 9.2  MG  --   --  2.0  --   --   --    Liver Function Tests: No results found for this basename: AST, ALT, ALKPHOS, BILITOT, PROT, ALBUMIN,  in the last 168 hours  CBC:  Recent Labs Lab 12/02/13 0300 12/03/13 0400 12/04/13 0431 12/05/13 0500 12/06/13 0557  WBC 11.8* 13.8* 12.3* 13.5* 15.9*  HGB 12.2* 12.4* 12.7* 12.3* 12.3*  HCT 36.0* 37.3* 37.1* 36.9* 36.9*  MCV 88.0 88.0 86.7 86.8 87.0  PLT 216 234 229 251 292    No results found for this or any previous visit (from the past 240 hour(s)).   Studies:  Recent x-ray studies have been reviewed in detail by the Attending Physician  Time spent : 59mns     Allison Ellis, ALovingtonTriad Hospitalists Office  [phone] [phone] **If unable to reach the above provider after paging please contact the  Flow Manager @ [phone]  On-Call/Text Page:      [email or web address]      password [email or web address]  If 7PM-7AM, please contact night-coverage [email or web address] Password [email or web address] 12/06/2013, 12:43 PM   LOS: 12 days   I have personally examined this patient and reviewed the entire database. I have reviewed the above note, made any necessary editorial changes, and agree with its content.  Cherene Altes, MD [facility]

## 2013-12-06 NOTE — Progress Notes (Signed)
Patient ID: James [name], male   DOB: August 10, 1935, 79 y.o.   MRN: [id number]    SUBJECTIVE: Patient denies chest pain or dyspnea at rest.  There seems to be a degree of dementia versus mental slowing.  CVP now back up a bit again to 6.  BP stable on dopamine, now titrating down.    Scheduled Meds: . aspirin  81 mg Oral Pre-Cath  . aspirin EC  81 mg Oral Daily  . atorvastatin  40 mg Oral q1800  . clopidogrel  75 mg Oral Q breakfast  . digoxin  0.125 mg Oral Daily  . diphenhydrAMINE  25 mg Intravenous Pre-Cath  . levothyroxine  262 mcg Oral QAC breakfast  . methylPREDNISolone (SOLU-MEDROL) injection  125 mg Intravenous Pre-Cath  . sodium chloride  10-40 mL Intracatheter Q12H  . sodium chloride  3 mL Intravenous Q12H  . sodium chloride  3 mL Intravenous Q12H  . sodium chloride  3 mL Intravenous Q12H  . spironolactone  12.5 mg Oral Daily   Continuous Infusions: . sodium chloride 5 mL/hr at 12/02/13 2000  . DOPamine 5 mcg/kg/min (12/05/13 1100)  . heparin 1,800 Units/hr (12/06/13 0656)   PRN Meds:.sodium chloride, sodium chloride, sodium chloride, acetaminophen, ondansetron (ZOFRAN) IV, sodium chloride, sodium chloride, sodium chloride, sodium chloride    Filed Vitals:   12/06/13 0400 12/06/13 0500 12/06/13 0600 12/06/13 0700  BP: 93/57 99/61 95/61  91/65  Pulse:      Temp: 98 F (36.7 C)     TempSrc: Oral     Resp: 23 24 25 24   Height:      Weight:  82.8 kg (182 lb 8.7 oz)    SpO2: 100%       Intake/Output Summary (Last 24 hours) at 12/06/13 0748 Last data filed at 12/06/13 0700  Gross per 24 hour  Intake   1768 ml  Output    650 ml  Net   1118 ml    LABS: Basic Metabolic Panel:  Recent Labs  30/86/5701/11/19 0430 12/04/13 0431 12/05/13 0500  NA  --  135* 139  K  --  3.7 3.9  CL  --  94* 100  CO2  --  27 25  GLUCOSE  --  112* 112*  BUN  --  34* 34*  CREATININE  --  1.01 0.61  CALCIUM  --  9.2 9.0  MG 2.0  --   --    Liver Function Tests: No results found for  this basename: AST, ALT, ALKPHOS, BILITOT, PROT, ALBUMIN,  in the last 72 hours No results found for this basename: LIPASE, AMYLASE,  in the last 72 hours CBC:  Recent Labs  12/05/13 0500 12/06/13 0557  WBC 13.5* 15.9*  HGB 12.3* 12.3*  HCT 36.9* 36.9*  MCV 86.8 87.0  PLT 251 292   Cardiac Enzymes: No results found for this basename: CKTOTAL, CKMB, CKMBINDEX, TROPONINI,  in the last 72 hours BNP: No components found with this basename: POCBNP,  D-Dimer: No results found for this basename: DDIMER,  in the last 72 hours Hemoglobin A1C: No results found for this basename: HGBA1C,  in the last 72 hours Fasting Lipid Panel: No results found for this basename: CHOL, HDL, LDLCALC, TRIG, CHOLHDL, LDLDIRECT,  in the last 72 hours Thyroid Function Tests: No results found for this basename: TSH, T4TOTAL, FREET3, T3FREE, THYROIDAB,  in the last 72 hours Anemia Panel: No results found for this basename: VITAMINB12, FOLATE, FERRITIN, TIBC, IRON, RETICCTPCT,  in the last  72 hours  RADIOLOGY: Dg Chest Port 1 View  11/26/2013   CLINICAL DATA:  Chest pain and congestive heart failure.  EXAM: PORTABLE CHEST - 1 VIEW  COMPARISON:  11/25/2013  FINDINGS: Previous median sternotomy and CABG procedure. The heart size is moderately enlarged. There is persistent airspace consolidation within the left upper lobe. Mild diffuse edema is identified elsewhere. Calcification within the left ventricular apex is noted which may be the sequelae of prior infarct  IMPRESSION: 1. Left upper lobe pneumonia. 2. Superimposed pulmonary edema compatible with CHF.   Electronically Signed   By: Signa Kell M.D.   On: 11/26/2013 10:55   Dg Chest Port 1 View  11/25/2013   CLINICAL DATA:  Congestive heart failure. Hypotension. Coronary artery disease.  EXAM: PORTABLE CHEST - 1 VIEW  COMPARISON:  11/24/13  FINDINGS: Left upper lobe airspace disease shows no significant interval change. Cardiomegaly and pulmonary vascular  congestion are stable. No evidence of pleural effusion. Prior CABG again noted.  IMPRESSION: No significant change in left upper lobe airspace disease, suspicious for pneumonia.   Electronically Signed   By: Myles Rosenthal M.D.   On: 11/25/2013 20:14    PHYSICAL EXAM General: NAD Neck: JVP 8-9 cm, no thyromegaly or thyroid nodule.  Lungs: Clear to auscultation bilaterally with normal respiratory effort. CV: Nondisplaced PMI.  Heart regular S1/S2, no S3/S4, 1/6 HSM apex.  1+ edema to knee right leg.  No carotid bruit.   Abdomen: Soft, nontender, no hepatosplenomegaly, no distention.  Neurologic: Alert and oriented x 3.  Psych: Normal affect. Extremities: No clubbing or cyanosis.   TELEMETRY: Reviewed telemetry pt in NSR   ASSESSMENT AND PLAN: 79 yo with history of CAD s/p CABG and chronic systolic CHF was admitted with NSTEMI.  He also has acute on chronic systolic CHF with low output and was found to have a RLE DVT.   1. CAD: NSTEMI with complex disease in SVG-OM.  This is a potential target for intervention, but we have been managing significant volume overload prior to the procedure and now hypotension.   - Continue heparin gtt, ASA 81, Plavix, atorvastatin.  -  I spoke with Dr [place] yesterday and postponed PCI due to hypotension (?overshot diuresis). Dopamine now being titrated down so will plan PCI today.  - Creatinine today still pending. 2. Acute on chronic systolic CHF:  Ischemic cardiomyopathy.  Echo with EF 20-25%, diffuse hypokinesis, mild to moderate RV dysfunction, moderate pulmonary hypertension, moderate MR.  Low output with co-ox 52% initially, up to 66% on milrinone with good diuresis.  CVP dropped to 2 with stable creatinine but he became hypotensive and dopamine was started.  I suspect we overshot his diuresis (also got low dose lisinopril).  Lasix held yesterday, CVP back up to 6 and BP better.  Continue digoxin and spironolactone. Lisinopril was stopped. Good co-ox this  morning.  Will continue to down-titrate dopamine.   3. RLE DVT: Continue heparin gtt, will need to transition to coumadin versus NOAC eventually (after PCI).   4. Patient has mild confusion, ?component of dementia.  Needs PT.  Will need to look into his living situation, probably going to need rehab at discharge.   Marca Ancona 12/06/2013 7:48 AM

## 2013-12-06 NOTE — Progress Notes (Signed)
ANTICOAGULATION CONSULT NOTE - Initial Consult  Pharmacy Consult for Xarelto Indication: pulmonary embolus and DVT  Allergies  Allergen Reactions  . Celecoxib     Unknown   . Codeine     Unknown   . Iohexol     Unknown     Patient Measurements: Height: 5\' 9"  (175.3 cm) Weight: 182 lb 8.7 oz (82.8 kg) IBW/kg (Calculated) : 70.7   Vital Signs: Temp: 97.5 F (36.4 C) (03/04 1600) Temp src: Oral (03/04 1600) BP: 85/50 mmHg (03/04 1600) Pulse Rate: 84 (03/04 1348)  Labs:  Recent Labs  12/04/13 0431  12/05/13 0500 12/06/13 0557 12/06/13 1205  HGB 12.7*  --  12.3* 12.3*  --   HCT 37.1*  --  36.9* 36.9*  --   PLT 229  --  251 292  --   APTT  --   --   --  112*  --   LABPROT  --   --   --  15.4*  --   INR  --   --   --  1.25  --   HEPARINUNFRC 0.30  < > 0.54 0.91* 0.80*  CREATININE 1.01  --  0.61 0.30*  --   < > = values in this interval not displayed.  Estimated Creatinine Clearance: 77.3 ml/min (by C-G formula based on Cr of 0.3).   Medical History: Past Medical History  Diagnosis Date  . Coronary artery disease 09/2007    s/p CABG and s/p placement of drug-eluting stent tothe saphenous vein graft to right coronary arter   . CHF (congestive heart failure)     which is felt to be compensated/Adx 06/2009  . GERD (gastroesophageal reflux disease)   . Hypertension   . Hyperlipidemia   . Thyroid disease   . Obesity   . Nodule of right lung   . Porcelain gallbladder     Assessment: [age] YOM who was on heparin for NSTEMI and DVT. Went to cath today- had DES placed x2- is to transition to Xarelto for treatment of DVT. Also ?PE which needs to be confirmed. Last heparin level was elevated at 0.8units/mL at 1200 this afternoon. Heparin stopped for cath. Sheath removed at ~1430.  Goal of Therapy:  aPTT 66-102 seconds Monitor platelets by anticoagulation protocol: Yes   Plan:  1. Start Xarelto 15mg  BID tonight- needs 21 days of therapy-last dose will be 3/25 PM-  then start Xarelto 20mg  qsupper 2. Follow CBC q72h  3. Follow for s/s bleeding and PE work up  General MillsLauren D. Chaos Carlile, PharmD, BCPS Clinical Pharmacist Pager: [phone] 12/06/2013 7:01 PM

## 2013-12-06 NOTE — CV Procedure (Signed)
   CARDIAC CATH NOTE  Name: James [name] MRN: [id number] DOB: 1935-06-25  Procedure: PTCA and drug-eluting stenting of the SVG to OM X 2  Indication: non-ST elevation myocardial infarction.  Medications:  Sedation: no sedation  Contrast:  80 ml Omnipaque  Procedural Details: The right groin was prepped, draped, and anesthetized with 1% lidocaine. Using the modified Seldinger technique, a 6 Fr sheath was introduced into the right femoral artery.  Weight-based bivalirudin was given for anticoagulation. Once a therapeutic ACT was achieved, a 6 [place] AL-1 guide catheter was inserted.  A run-through coronary guidewire was used to cross the lesion.  The graft was subtotally occluded with TIMI 2 flow. I felt that passing a distal protection device would be almost impossible given severity of the lesion and also the presence of multiple lesions with increased risk of entrapment. Thus, I decided not to use distal protection. There was significant resistance in advancing the balloon through the proximal lesion. The lesion was predilated with a 2.0 x 15 balloon. There was significant disease extending into the midsegment. At this point, I elected to direct stent the distal lesion with a 3.0 x 12 mm Xience drug-eluting stent to 12 atmosphere. The proximal to mid lesion was stented with a 2.5 x 38 mm Xience drug-eluting stent. There was reasonable but somewhat slow flow. I gave a total of 240 mcg of intercoronary adenosine with improved flow to normal. Following PCI, there was 0% residual stenosis and TIMI-3 flow. Final angiography confirmed an excellent result. The patient tolerated the procedure well. There were no immediate procedural complications. Femoral hemostasis was achieved with a Mynx closure device. The patient was transferred to the post catheterization recovery area for further monitoring.  PCI Data: Vessel - SVG to OM 3/Segment - distal Percent Stenosis (pre)  80% TIMI-flow  2 Stent 3.0 x  12 mm Xience drug-eluting stent Percent Stenosis (post) 0% TIMI-flow (post) 3  Vessel - SVG to OM 3/Segment - proximal into midsegment Percent Stenosis (pre)  99% TIMI-flow 2 Stent 2.5 x 38 mm Xience drug-eluting stent Percent Stenosis (post) 0% TIMI-flow (post) 3  Final Conclusions:  Successful angioplasty and drug-eluting stent placement x2 to SVG to OM 3.   Recommendations:  Continue dual antiplatelet therapy for at least 12 months and ideally indefinitely. Continue to optimize medical management for coronary artery disease and heart failure.   Lorine BearsMuhammad Arida MD, [facility] Surgical CenterFACC 12/06/2013, 2:44 PM

## 2013-12-06 NOTE — Progress Notes (Addendum)
ANTICOAGULATION CONSULT NOTE - Follow Up Consult  Pharmacy Consult for Heparin  Indication: chest pain/ACS, DVT  Allergies  Allergen Reactions  . Celecoxib     Unknown   . Codeine     Unknown   . Iohexol     Unknown     Patient Measurements: Height: 5\' 9"  (175.3 cm) Weight: 182 lb 8.7 oz (82.8 kg) IBW/kg (Calculated) : 70.7  Vital Signs: Temp: 98 F (36.7 C) (03/04 0400) Temp src: Oral (03/04 0400) BP: 97/57 mmHg (03/04 0300)  Labs:  Recent Labs  12/04/13 0431 12/04/13 1500 12/05/13 0500 12/06/13 0557  HGB 12.7*  --  12.3* 12.3*  HCT 37.1*  --  36.9* 36.9*  PLT 229  --  251 292  HEPARINUNFRC 0.30 0.34 0.54 0.91*  CREATININE 1.01  --  0.61  --     Estimated Creatinine Clearance: 77.3 ml/min (by C-G formula based on Cr of 0.61).   Medications:  Heparin 2000 units/hr  Assessment: [name] on heparin for NSTEMI, new RLE DVT, appears to be going to cath today, HL is 0.91 this AM, other labs as above. Per RN, some oozing of blood from cath site, Hgb stable this AM, will continue to monitor closely.   Goal of Therapy:  Heparin level 0.3-0.7 units/ml Monitor platelets by anticoagulation protocol: Yes   Plan:  -Decrease heparin drip to 1800 units/hr -1500 HL, pending cath late this evening -Daily CBC/HL, pending cath -Monitor for bleeding  Abran DukeLedford, James 12/06/2013,6:48 AM  Heparin level still elevated at 0.8. No bleeding issues noted on rate of 1800/hr. Cath lab now calling, will follow up after cath anticoagulation plan.  If heparin restarted post-cath - would restart closer to 1600/hr.  Sheppard CoilFrank Khyron Garno PharmD., BCPS Clinical Pharmacist Pager [phone] 12/06/2013 1:19 PM

## 2013-12-07 LAB — CBC
HCT: 35.7 % — ABNORMAL LOW (ref 39.0–52.0)
Hemoglobin: 11.8 g/dL — ABNORMAL LOW (ref 13.0–17.0)
MCH: 28.8 pg (ref 26.0–34.0)
MCHC: 33.1 g/dL (ref 30.0–36.0)
MCV: 87.1 fL (ref 78.0–100.0)
PLATELETS: 253 10*3/uL (ref 150–400)
RBC: 4.1 MIL/uL — ABNORMAL LOW (ref 4.22–5.81)
RDW: 13.5 % (ref 11.5–15.5)
WBC: 13.2 10*3/uL — AB (ref 4.0–10.5)

## 2013-12-07 LAB — BASIC METABOLIC PANEL
BUN: 30 mg/dL — ABNORMAL HIGH (ref 6–23)
BUN: 29 mg/dL — AB (ref 6–23)
CALCIUM: 8.8 mg/dL (ref 8.4–10.5)
CO2: 22 meq/L (ref 19–32)
CO2: 22 mEq/L (ref 19–32)
CREATININE: 0.79 mg/dL (ref 0.50–1.35)
Calcium: 8.8 mg/dL (ref 8.4–10.5)
Chloride: 99 mEq/L (ref 96–112)
Chloride: 97 mEq/L (ref 96–112)
Creatinine, Ser: 0.78 mg/dL (ref 0.50–1.35)
GFR calc Af Amer: >90 mL/min (ref >90)
GFR, EST NON AFRICAN AMERICAN: 85 mL/min — AB (ref >90)
GFR, EST NON AFRICAN AMERICAN: 84 mL/min — AB (ref >90)
Glucose, Bld: 89 mg/dL (ref 70–99)
Glucose, Bld: 101 mg/dL — ABNORMAL HIGH (ref 70–99)
Potassium: 4.9 mEq/L (ref 3.7–5.3)
Potassium: 4.5 mEq/L (ref 3.7–5.3)
Sodium: 135 mEq/L — ABNORMAL LOW (ref 137–147)
Sodium: 131 mEq/L — ABNORMAL LOW (ref 137–147)

## 2013-12-07 LAB — CARBOXYHEMOGLOBIN
CARBOXYHEMOGLOBIN: 1.8 % — AB (ref 0.5–1.5)
Methemoglobin: 1.4 % (ref 0.0–1.5)
O2 SAT: 50.3 %
Total hemoglobin: 11.7 g/dL — ABNORMAL LOW (ref 13.5–18.0)

## 2013-12-07 MED ORDER — FUROSEMIDE 10 MG/ML IJ SOLN
60.0000 mg | Freq: Two times a day (BID) | INTRAMUSCULAR | Status: DC
  Administered 2013-12-07 – 2013-12-12 (×9): 60 mg via INTRAVENOUS
  Filled 2013-12-07 (×14): qty 6

## 2013-12-07 MED FILL — Sodium Chloride IV Soln 0.9%: INTRAVENOUS | Qty: 50 | Status: AC

## 2013-12-07 NOTE — Progress Notes (Signed)
Clinical Social Work Department BRIEF PSYCHOSOCIAL ASSESSMENT 12/07/2013  Patient:  Carolynn CommentJOHNSON,Oluwadarasimi R     Account Number:  [id number]     Admit date:  11/24/2013  Clinical Social Worker:  Varney BilesANDERSON,Magalie Almon, LCSWA  Date/Time:  12/07/2013 02:24 PM  Referred by:  Physician  Date Referred:  12/07/2013 Referred for  SNF Placement   Other Referral:   Interview type:  Patient Other interview type:   CSW spoke with pt's sister on the phone.    PSYCHOSOCIAL DATA Living Status:  FAMILY Admitted from facility:   Level of care:   Primary support name:  Octavia BrucknerGaynell Mabe [phone]) Primary support relationship to patient:  SIBLING Degree of support available:   Good--pt lives with sister and brother-in-law.    CURRENT CONCERNS Current Concerns  Post-Acute Placement   Other Concerns:    SOCIAL WORK ASSESSMENT / PLAN CSW called pt's sister Abbie SonsGaynell, as pt oriented to person per chart but disoriented x3. CSW explained [facility] for SN[facility], who expressed hesitation about placement. Gaynell states she "doesn't want [pt] to feel like she is pushing him out." CSW explained SNF is for short-term rehab, and the focus is on getting pt's strength up so he can return home. Gaynell asked if pt could get this care at home, and CSW explained home health is an option, but it would be far less frequent and the medical team is recommending SNF because they are better equipped to handle pt's needs. Gaynell's husband is not in good health as well, per conversation with RNCM, so recommendation for SNF would be especially helpful for pt to get strength up before returning. Pt's sister states she needs to talk with pt about what they want to do, and CSW informed RNCM to call Gaynell to discuss home health. RNCM and CSW went to pt's room to talk with him about options, and pt expressed that though he would like to go home, he will consider SNF. Gaynell gave CSW permission [place], and CSW has done this. Pt seemed oriented to conversation, asking appropriate questions and voicing appropriate concerns, but responses were a little delayed. CSW and RNCM following pt case together and working with pt/his sister to coordinate the best plan for pt and that discharge plan that pt/sister believe is best. CSW assured pt that if he went to SNF to try it out and decided it was not beneficial, he could leave at any time. Pt relieved to hear this and states he will consider.   Assessment/plan status:  Psychosocial Support/Ongoing Assessment of Needs Other assessment/ plan:   Information/referral to community resources:   ?SNF ?Home health    PATIENT'S/FAMILY'S RESPONSE TO PLAN OF CARE: Good--pt's sister voiced concerns about placement and particularly did not like the idea of pt going to a "nursing home." CSW explained rehab and that this would only be temporary, and sister still had concerns with not understanding what "temporary" means. CSW answered questions and addressed concerns and Gaynell gave CSW permission [place]. Gaynell will speak with pt about what they want to do, and RNCM is following case for d/c planning as well.       Maryclare LabradorJulie Trinisha Paget, MSW, [facility] MinocquaCSWA Clinical Social Worker [phone]

## 2013-12-07 NOTE — Evaluation (Signed)
Occupational Therapy Evaluation Patient Details Name: James [name] MRN: [id number] DOB: 08/27/1935 Today's Date: 12/07/2013 Time: [id number] OT Time Calculation (min): 25 min  OT Assessment / Plan / Recommendation History of present illness James [name] is a 79 y.o. male with a history of CAD, and ischemic Cardiomyopathy and Chronic systolic CHF who [facility] due to complaints of SOB, and worsening swelling of his lower legs  R>L over the past 2 days.  He denied having any Chest pain or fever or chills.   He was evaluated in the ED and was found to have a +Troponin level of 3.5, and changes consisted with CHF and possible pneumonia on chest X-ray per the EDP s/p cath 3/4. RLE DVT   Clinical Impression   Pt presents with low activity tolerance, balance deficits, dependence in ADL that require standing or accessing his feet, pain in L foot, and cognitive deficits.  Pt was independent with self care and walked with a cane prior to admission.  No family is available to determine prior cognitive level.  Will follow acutely.  Recommending further rehab upon discharge to facilitate return to PLOF.    OT Assessment  Patient needs continued OT Services    Follow Up Recommendations  SNF;Supervision/Assistance - 24 hour    Barriers to Discharge      Equipment Recommendations       Recommendations for Other Services    Frequency  Min 2X/week    Precautions / Restrictions Precautions Precautions: Fall Restrictions Weight Bearing Restrictions: No   Pertinent Vitals/Pain R foot with weight bearing, did not rate, repositioned, VSS     ADL  Eating/Feeding: Set up Where Assessed - Eating/Feeding: Chair Grooming: Wash/dry hands;Wash/dry face;Denture care;Minimal assistance Where Assessed - Grooming: Unsupported sitting Upper Body Bathing: Minimal assistance Where Assessed - Upper Body Bathing: Unsupported sitting Lower Body Bathing: Maximal assistance Where Assessed -  Lower Body Bathing: Unsupported sitting;Supported sit to stand Upper Body Dressing: Minimal assistance Where Assessed - Upper Body Dressing: Unsupported sitting Lower Body Dressing: Maximal assistance Where Assessed - Lower Body Dressing: Unsupported sitting;Supported sit to stand Toilet Transfer: Moderate assistance Toilet Transfer Method: Stand pivot Toilet Transfer Equipment: Bedside commode Toileting - Clothing Manipulation and Hygiene: Maximal assistance Where Assessed - Toileting Clothing Manipulation and Hygiene: Standing Equipment Used: Gait belt Transfers/Ambulation Related to ADLs: did not ambulate, stand pivot with mod assist, pt with anxiety, attempts to sit prematurely. ADL Comments: Pt fatigues very easily with attempt to perform LB dressing.    OT Diagnosis: Generalized weakness;Acute pain;Cognitive deficits  OT Problem List: Decreased strength;Decreased activity tolerance;Impaired balance (sitting and/or standing);Decreased cognition;Decreased safety awareness;Decreased knowledge of use of DME or AE;Cardiopulmonary status limiting activity;Pain OT Treatment Interventions: Self-care/ADL training;DME and/or AE instruction;Patient/family education;Balance training   OT Goals(Current goals can be found in the care plan section) Acute Rehab OT Goals Patient Stated Goal: be able to walk to the mailbox OT Goal Formulation: With patient Time For Goal Achievement: 12/21/13 Potential to Achieve Goals: Good ADL Goals Pt Will Perform Toileting - Clothing Manipulation and hygiene: with min guard assist;sit to/from stand  Visit Information  Last OT Received On: 12/07/13 Assistance Needed: +1 History of Present Illness: James [name] is a 79 y.o. male with a history of CAD, and ischemic Cardiomyopathy and Chronic systolic CHF who [facility] due to complaints of SOB, and worsening swelling of his lower legs  R>L over the past 2 days.  He denied having  any Chest pain  or fever or chills.   He was evaluated in the ED and was found to have a +Troponin level of 3.5, and changes consisted with CHF and possible pneumonia on chest X-ray per the EDP s/p cath 3/4. RLE DVT       Prior Functioning     Home Living Family/patient expects to be discharged to:: Private residence Living Arrangements: Other relatives Available Help at Discharge: Available 24 hours/day;Family Type of Home: Mobile home Home Access: Stairs to enter Entrance Stairs-Number of Steps: 3 Home Layout: One level Home Equipment: Cane - single point Prior Function Level of Independence: Independent with assistive device(s) Comments: states cousins he lives with do the housework and cooking. pt states he can walk to the mailbox with his cane Communication Communication: No difficulties Dominant Hand: Right         Vision/Perception Vision - History Baseline Vision: Wears glasses all the time Patient Visual Report: No change from baseline   Cognition  Cognition Arousal/Alertness: Awake/alert Behavior During Therapy: WFL for tasks assessed/performed Overall Cognitive Status: No family/caregiver present to determine baseline cognitive functioning Area of Impairment: Memory;Safety/judgement Memory: Decreased short-term memory Safety/Judgement: Decreased awareness of safety;Decreased awareness of deficits    Extremity/Trunk Assessment Upper Extremity Assessment Upper Extremity Assessment: Overall WFL for tasks assessed Lower Extremity Assessment Lower Extremity Assessment: Defer to PT evaluation Cervical / Trunk Assessment Cervical / Trunk Assessment: Normal     Mobility Bed Mobility Overal bed mobility:  (not assessed, pt up in chair) Bed Mobility: Supine to Sit Supine to sit: Supervision General bed mobility comments: cues for use of rail for sequence and initiation Transfers Overall transfer level: Needs assistance Transfers: Sit to/from Stand;Stand Pivot Transfers Sit  to Stand: Mod assist Stand pivot transfers: Mod assist General transfer comment: stood in front of pt and used B hand held assist, verbal cues to push up from chair, physical assist to control descent to chair     Exercise    Balance Balance Overall balance assessment: Needs assistance Sitting-balance support: Feet supported Sitting balance-Leahy Scale: Fair Standing balance support: Bilateral upper extremity supported Standing balance-Leahy Scale: Poor   End of Session OT - End of Session Equipment Utilized During Treatment: Gait belt Activity Tolerance: Patient limited by fatigue Patient left: in chair;with call bell/phone within reach;with nursing/sitter in room  GO     Evern Bio 12/07/2013, 11:11 AM [phone]

## 2013-12-07 NOTE — Progress Notes (Signed)
Transferred to 3east room13 by wheelchair, sister made aware about the  Transfer. Belongings with pt. Report given to RN.

## 2013-12-07 NOTE — Progress Notes (Signed)
Pt transferred to 3E. Unit CSW provided a handoff. I am signing off.   Gillie Crisci, MSW, LCSWA Clinical Social Worker [phone] 

## 2013-12-07 NOTE — Evaluation (Signed)
Physical Therapy Evaluation Patient Details Name: James [name] MRN: [id number] DOB: January 10, 1935 Today's Date: 12/07/2013 Time: [id number] PT Time Calculation (min): 21 min  PT Assessment / Plan / Recommendation History of Present Illness  James [name] is a 79 y.o. male with a history of CAD, and ischemic Cardiomyopathy and Chronic systolic CHF who was [facility] due to complaints of SOB, and worsening swelling of his lower legs  R>L over the past 2 days.  He denied having any Chest pain or fever or chills.   He was evaluated in the ED and was found to have a +Troponin level of 3.5, and changes consisted with CHF and possible pneumonia on chest X-ray per the EDP s/p cath 3/4. RLE DVT  Clinical Impression  Pt pleasantly confused and limited with mobility today. Pt states that his family can assist some but not much with mobility. Pt reports pain in Right foot made standing difficult. Pt with below deficits and will benefit from acute therapy to maximize mobility, transfers, function and safety to decrease burden of care.     PT Assessment  Patient needs continued PT services    Follow Up Recommendations  SNF;Supervision/Assistance - 24 hour    Does the patient have the potential to tolerate intense rehabilitation      Barriers to Discharge Decreased caregiver support      Equipment Recommendations  Rolling walker with 5" wheels    Recommendations for Other Services     Frequency Min 3X/week    Precautions / Restrictions Precautions Precautions: Fall Restrictions Weight Bearing Restrictions: No   Pertinent Vitals/Pain Right foot pain 6/10 HR 86 sats 90-98% on RA 120/54      Mobility  Bed Mobility Overal bed mobility: Needs Assistance Bed Mobility: Supine to Sit Supine to sit: Supervision General bed mobility comments: cues for use of rail for sequence and initiation Transfers Overall transfer level: Needs assistance Transfers: Sit to/from Stand;Stand  Pivot Transfers Sit to Stand: Mod assist Stand pivot transfers: Mod assist General transfer comment: max cues for hand placement with assist for anterior translation, attempted to take a couple steps away from bed after one step pt stated "I can't" and began reaching for the chair without being close to surface and mod assist to assist pelvist to chair to prevent fall with max cueing for safety    Exercises General Exercises - Lower Extremity Long Arc Quad: AROM;Seated;Both;5 reps Hip Flexion/Marching: AROM;Seated;Both;5 reps   PT Diagnosis: Difficulty walking;Altered mental status;Generalized weakness  PT Problem List: Decreased strength;Decreased cognition;Decreased activity tolerance;Decreased balance;Decreased safety awareness;Decreased knowledge of use of DME;Pain;Decreased mobility PT Treatment Interventions: Gait training;DME instruction;Balance training;Functional mobility training;Patient/family education;Therapeutic activities;Therapeutic exercise     PT Goals(Current goals can be found in the care plan section) Acute Rehab PT Goals Patient Stated Goal: be able to walk to the mailbox PT Goal Formulation: With patient Time For Goal Achievement: 12/21/13 Potential to Achieve Goals: Fair  Visit Information  Last PT Received On: 12/07/13 Assistance Needed: +1 History of Present Illness: James [name] is a 79 y.o. male with a history of CAD, and ischemic Cardiomyopathy and Chronic systolic CHF who [facility] due to complaints of SOB, and worsening swelling of his lower legs  R>L over the past 2 days.  He denied having any Chest pain or fever or chills.   He was evaluated in the ED and was found to have a +Troponin level of 3.5, and changes consisted with  CHF and possible pneumonia on chest X-ray per the EDP s/p cath 3/4. RLE DVT       Prior Functioning  Home Living Family/patient expects to be discharged to:: Private residence Living Arrangements: Other  relatives Available Help at Discharge: Friend(s);Available 24 hours/day Type of Home: Mobile home Home Access: Stairs to enter Entrance Stairs-Number of Steps: 3 Home Layout: One level Home Equipment: Cane - single point Prior Function Level of Independence: Independent with assistive device(s) Comments: states cousins he lives with do the housework and cooking. pt states he can walk to the mailbox with his cane Communication Communication: No difficulties    Cognition  Cognition Arousal/Alertness: Awake/alert Behavior During Therapy: Flat affect Overall Cognitive Status: Impaired/Different from baseline Area of Impairment: Memory;Safety/judgement Memory: Decreased short-term memory Safety/Judgement: Decreased awareness of deficits    Extremity/Trunk Assessment Upper Extremity Assessment Upper Extremity Assessment: Generalized weakness Lower Extremity Assessment Lower Extremity Assessment: Generalized weakness Cervical / Trunk Assessment Cervical / Trunk Assessment: Normal   Balance    End of Session PT - End of Session Equipment Utilized During Treatment: Gait belt Activity Tolerance: Patient limited by fatigue Patient left: in chair;with call bell/phone within reach Nurse Communication: Mobility status  GP     Delorse Lek 12/07/2013, 9:49 AM Delaney Meigs, PT [phone]

## 2013-12-07 NOTE — Progress Notes (Signed)
[name]  James [name] IWP:[id number] DOB: 03/07/1935 DOA: 11/24/2013 PCP: [name], DO  Brief narrative: 79 year old male with history of CAD status post CABG as well as congestive heart failure, grade 1 diastolic and systolic CHF with a decreased ejection fraction of 40% who was transferred from [facility] emergency room after presenting there on 2/20 with complaints of shortness of breath and lower extremity swelling x2 days. He was found to have a troponin of 3.5 and chest x-ray noted pneumonia and congestive heart failure so patient was transferred to the hospitalist service and placed in the step down unit. Patient's cardiologist is Dr. Percival Spanish of [facility] heart care and was consulted.   Attempts at diuresing were met with hypotension;  The patient's blood pressure dropped as low as 50s. Critical care was consulted initially for possible dopamine, however patient did respond to gentle fluid boluses. As troponins were trending down, pneumonia treated, and blood pressure stabilized, patient's mentation improved.   Assessment/Plan:  Acute respiratory failure with hypoxia :   A) Ischemic CM / Acute on chronic systolic heart failure   B) Left upper lobe pneumonia v/s pulmonary infarct  -diuresis has been limited by hypotension-likely over diuresed recently -need to clarify if PE since if PE anticoagulation would be 6 months as opposed to 3 months-need CT angiogram of chest in the next 24-48 hours -Antibiotics discontinued  -Milrinone added by Cardiology but after weaned pt developed hypotension -did not tolerate when resumed 2/2 arrhythmia so Dopamine used to keep SBP>80-85-now being titrated down -Management of heart failure and ischemia per Cardiology team-resumed IV Lasix  DVT of right lower extremity  -new finding this admission -on IV Heparin for cardiac ischemia as well -have asked CM to clarify co pay of NOAC  CAD/NSTEMI  -per  Cards -cath 2/25:diffuse dz circumflex and stenosis vein graft right-interventional decision pending -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-OM x 2  Persistent leukocytosis -no clear evidence of infection at this time - follow trend   Hypotension -recurrent and seems r/t meds not sepsis (see above)  CKD  stage II -renal fnx stable post cath  Physical deconditioning -PT recommend skilled nursing facility but patient not sure this is what he wants -Continue to follow-hopefully will improve closer to discharge and may be able to then go home with PT  HYPERLIPIDEMIA  Hx of Essential hypertension, benign -BP soft - usual meds on hold  Hypothyroidism -cont Synthroid  Acute delirium -resolving  DVT prophylaxis: IV heparin Code Status: Full Family Communication: cousin updated at bedside Disposition Plan/Expected LOS: Transfer to telemetry  Consultants: Cardiology  Procedures: 2-D echocardiogram  - Procedure narrative: Transthoracic echocardiography. Image quality was adequate. The study was technically difficult, as a result of poor acoustic windows and poor sound wave transmission. Intravenous contrast (Definity) was administered. - Left ventricle: The cavity size was severely dilated. Wall thickness was normal. Systolic function was severely reduced. The estimated ejection fraction was in the range of 20% to 25%. Diffuse hypokinesis. Indeterminant diastolic function. - Aortic valve: There was no stenosis. Trivial regurgitation. - Mitral valve: Mildly calcified annulus. Mildly calcified leaflets . Moderate central regurgitation. - Left atrium: The atrium was moderately dilated. - Right ventricle: The cavity size was mildly to moderately dilated. Systolic function was mildly reduced. - Right atrium: The atrium was mildly dilated. - Tricuspid valve: Peak RV-RA gradient: 47m Hg (S). - Pulmonary arteries: PA peak pressure: 678mHg  (  S). - Systemic veins: IVC measured 2.2 cm with < 50% respirophasic variation, suggesting RA pressure 15 mmHg.  Ankle-brachial index  Left ABI suggest mild, borderline arterial insufficiency. Unable to obtain right lower extremity pressures due to acute DVT. Unable to evaluate the right brachial artery due to bandaging.  Lower extremity venous duplex  preliminary results consistent with right lower extremity DVT involving posterior tibial and peroneal veins  Antibiotics: Levaquin 2/22 >>>2/25  HPI/Subjective: Alert-no complaints. Endorses he feels like he can walk and go home and not have to go to skilled nursing facility.  Objective: Blood pressure 114/61, pulse 94, temperature 97.4 F (36.3 C), temperature source Oral, resp. rate 23, height 5' 9"  (1.753 m), weight 190 lb 4.1 oz (86.3 kg), SpO2 98.00%.  Intake/Output Summary (Last 24 hours) at 12/07/13 1411 Last data filed at 12/07/13 0900  Gross per 24 hour  Intake    360 ml  Output    700 ml  Net   -340 ml   Exam: General: No acute respiratory distress Lungs: Clear to auscultation bilaterally without wheezes or crackles,  2 L Cardiovascular: Regular rate and rhythm without murmur gallop or rub normal S1 and S2, unilateral right lower extremity peripheral edema 2+ previous mild erythematous changes have resolved, SBP better- Dopamine gtt off Abdomen: Nontender, nondistended, soft, bowel sounds positive, no rebound, no ascites, no appreciable mass Musculoskeletal: No significant cyanosis, clubbing of bilateral lower extremities-dressing over R groin post cath   Scheduled Meds:  Scheduled Meds: . atorvastatin  40 mg Oral q1800  . clopidogrel  75 mg Oral Q breakfast  . digoxin  0.125 mg Oral Daily  . furosemide  60 mg Intravenous BID  . levothyroxine  262 mcg Oral QAC breakfast  . Rivaroxaban  15 mg Oral BID WC  . spironolactone  12.5 mg Oral Daily   Continuous Infusions: . DOPamine Stopped (12/06/13 1530)    Data  Reviewed: Basic Metabolic Panel:  Recent Labs Lab 12/04/13 0430 12/04/13 0431 12/05/13 0500 12/06/13 0557 12/07/13 0515 12/07/13 1030  NA  --  135* 139 136* 135* 131*  K  --  3.7 3.9 4.1 4.9 4.5  CL  --  94* 100 98 99 97  CO2  --  27 25 23 22 22   GLUCOSE  --  112* 112* 130* 89 101*  BUN  --  34* 34* 33* 30* 29*  CREATININE  --  1.01 0.61 0.30* 0.78 0.79  CALCIUM  --  9.2 9.0 9.2 8.8 8.8  MG 2.0  --   --   --   --   --    Liver Function Tests: No results found for this basename: AST, ALT, ALKPHOS, BILITOT, PROT, ALBUMIN,  in the last 168 hours  CBC:  Recent Labs Lab 12/03/13 0400 12/04/13 0431 12/05/13 0500 12/06/13 0557 12/07/13 0515  WBC 13.8* 12.3* 13.5* 15.9* 13.2*  HGB 12.4* 12.7* 12.3* 12.3* 11.8*  HCT 37.3* 37.1* 36.9* 36.9* 35.7*  MCV 88.0 86.7 86.8 87.0 87.1  PLT 234 229 251 292 253    No results found for this or any previous visit (from the past 240 hour(s)).   Studies:  Recent x-ray studies have been reviewed in detail by the Attending Physician  Time spent : 40mns     Allison Ellis, [facility] Office  [phone] [phone] **If unable to reach the above provider after paging please contact the [email or web address] [phone] On-Call/Text Page:      [email or web address]  password [email or web address]  If 7PM-7AM, please contact night-coverage [email or web address] Password [email or web address] 12/07/2013, 2:11 PM   LOS: 13 days     I have examined the patient, reviewed the chart and modified the above note which I agree with.   Sebastiana Wuest,MD Pager # [email or web address] 12/07/2013, 3:13 PM

## 2013-12-07 NOTE — Progress Notes (Signed)
R groin is a level 0. Some small amount of bleeding is noted. Dressing has been changed and drainage has been traced with a marker to monitor. Will continue to monitor patient for further changes in condition.

## 2013-12-07 NOTE — Progress Notes (Signed)
Patient ID: James [name], male   DOB: [birth year], 79 y.o.   MRN: [id number]    SUBJECTIVE: Patient seems more alert this morning.  Denies chest pain.  Some dyspnea with walk.  Had PCI to SVG-OM.  CVP 10.   Scheduled Meds: . aspirin EC  81 mg Oral Daily  . atorvastatin  40 mg Oral q1800  . clopidogrel  75 mg Oral Q breakfast  . digoxin  0.125 mg Oral Daily  . furosemide  60 mg Intravenous BID  . levothyroxine  262 mcg Oral QAC breakfast  . Rivaroxaban  15 mg Oral BID WC  . spironolactone  12.5 mg Oral Daily   Continuous Infusions: . DOPamine Stopped (12/06/13 1530)   PRN Meds:.acetaminophen, ondansetron (ZOFRAN) IV, sodium chloride    Filed Vitals:   12/07/13 0000 12/07/13 0400 12/07/13 0752 12/07/13 0900  BP: 94/49 96/61 120/54   Pulse: 84 78 86 97  Temp:  98.3 F (36.8 C) 97.8 F (36.6 C)   TempSrc:  Oral Oral   Resp: 22 26 14    Height:      Weight:  86.3 kg (190 lb 4.1 oz)    SpO2:  95% 90%     Intake/Output Summary (Last 24 hours) at 12/07/13 0956 Last data filed at 12/07/13 0900  Gross per 24 hour  Intake    562 ml  Output   1100 ml  Net   -538 ml    LABS: Basic Metabolic Panel:  Recent Labs  78/29/56 0557 12/07/13 0515  NA 136* 135*  K 4.1 4.9  CL 98 99  CO2 23 22  GLUCOSE 130* 89  BUN 33* 30*  CREATININE 0.30* 0.78  CALCIUM 9.2 8.8   Liver Function Tests: No results found for this basename: AST, ALT, ALKPHOS, BILITOT, PROT, ALBUMIN,  in the last 72 hours No results found for this basename: LIPASE, AMYLASE,  in the last 72 hours CBC:  Recent Labs  12/06/13 0557 12/07/13 0515  WBC 15.9* 13.2*  HGB 12.3* 11.8*  HCT 36.9* 35.7*  MCV 87.0 87.1  PLT 292 253   Cardiac Enzymes: No results found for this basename: CKTOTAL, CKMB, CKMBINDEX, TROPONINI,  in the last 72 hours BNP: No components found with this basename: POCBNP,  D-Dimer: No results found for this basename: DDIMER,  in the last 72 hours Hemoglobin A1C: No results found for  this basename: HGBA1C,  in the last 72 hours Fasting Lipid Panel: No results found for this basename: CHOL, HDL, LDLCALC, TRIG, CHOLHDL, LDLDIRECT,  in the last 72 hours Thyroid Function Tests: No results found for this basename: TSH, T4TOTAL, FREET3, T3FREE, THYROIDAB,  in the last 72 hours Anemia Panel: No results found for this basename: VITAMINB12, FOLATE, FERRITIN, TIBC, IRON, RETICCTPCT,  in the last 72 hours  RADIOLOGY: Dg Chest Port 1 View  11/26/2013   CLINICAL DATA:  Chest pain and congestive heart failure.  EXAM: PORTABLE CHEST - 1 VIEW  COMPARISON:  11/25/2013  FINDINGS: Previous median sternotomy and CABG procedure. The heart size is moderately enlarged. There is persistent airspace consolidation within the left upper lobe. Mild diffuse edema is identified elsewhere. Calcification within the left ventricular apex is noted which may be the sequelae of prior infarct  IMPRESSION: 1. Left upper lobe pneumonia. 2. Superimposed pulmonary edema compatible with CHF.   Electronically Signed   By: James [name] M.D.   On: 11/26/2013 10:55   Dg Chest Port 1 View  11/25/2013   CLINICAL DATA:  Congestive heart failure. Hypotension. Coronary artery disease.  EXAM: PORTABLE CHEST - 1 VIEW  COMPARISON:  11/24/13  FINDINGS: Left upper lobe airspace disease shows no significant interval change. Cardiomegaly and pulmonary vascular congestion are stable. No evidence of pleural effusion. Prior CABG again noted.  IMPRESSION: No significant change in left upper lobe airspace disease, suspicious for pneumonia.   Electronically Signed   By: [name]  [name] M.D.   On: 11/25/2013 20:14    PHYSICAL EXAM General: NAD Neck: JVP 10 cm, no thyromegaly or thyroid nodule.  Lungs: Clear to auscultation bilaterally with normal respiratory effort. CV: Nondisplaced PMI.  Heart regular S1/S2, no S3/S4, 1/6 HSM apex.  1+ edema to knee right leg.  No carotid bruit.   Abdomen: Soft, nontender, no hepatosplenomegaly, no  distention.  Neurologic: Alert and oriented x 3.  Psych: Normal affect. Extremities: No clubbing or cyanosis.   TELEMETRY: Reviewed telemetry pt in NSR   ASSESSMENT AND PLAN: 79 yo with history of CAD s/p CABG and chronic systolic CHF was admitted with NSTEMI.  He also has acute on chronic systolic CHF with low output and was found to have a RLE DVT.   1. CAD: NSTEMI with complex disease in SVG-OM, now s/p DES x 2 to SVG-OM. - Continue Plavix and statin.  Can stop ASA as he will be on Xarelto for 6 months.  Continue Plavix long-term. 2. Acute on chronic systolic CHF:  Ischemic cardiomyopathy.  Echo with EF 20-25%, diffuse hypokinesis, mild to moderate RV dysfunction, moderate pulmonary hypertension, moderate MR.  Low output with co-ox 52% initially, up to 66% on milrinone with good diuresis.  CVP dropped to 2 with stable creatinine but he became hypotensive and dopamine was started.  I suspect we overshot his diuresis (also got low dose lisinopril).  Lasix held, now dyspneic with CVP up to 10.  Off dopamine.      - Continue digoxin and spironolactone.  - Check coox - Gentle Lasix today: 60 mg IV bid, likely transition over to po tomorrow.  3. RLE DVT: On Xarelto now, needs at least 6 months.   4. Encephalopathy: Improving.  5. Needs ongoing PT, needs SNF versus ?inpatient rehab.   James [name] 12/07/2013 9:56 AM

## 2013-12-07 NOTE — Progress Notes (Signed)
Report given to receiving RN. Patient in bed resting. No verbal complaints or signs of distress.

## 2013-12-07 NOTE — Progress Notes (Signed)
Clinical Social Work Department CLINICAL SOCIAL WORK PLACEMENT NOTE 12/07/2013  Patient:  Carolynn CommentJOHNSON,Davison R  Account Number:  [id number] Admit date:  11/24/2013  Clinical Social Worker:  Maryclare LabradorJULIE Avid Guillette, Theresia MajorsLCSWA  Date/time:  12/07/2013 02:31 PM  Clinical Social Work is seeking post-discharge placement for this patient at the following level of care:   SKILLED NURSING   (*CSW will update this form in Epic as items are completed)   N/A--in phone conversation with pt's sister, CSW offered to give names/addresses for facilities, but sister authorized CSW to send clinicals to all [place] and let her know which are options  Patient/family provided with [facility] of Clinical Social Work's list of facilities offering this level of care within the geographic area requested by the patient (or if unable, by the patient's family).  12/07/2013  Patient/family informed of their freedom to choose among providers that offer the needed level of care, that participate in Medicare, Medicaid or managed care program needed by the patient, have an available bed and are willing to accept the patient.  N/A-clinicals not sent to this facility  Patient/family informed of [facility]' ownership interest in [facility], as well as of the fact that they are under no obligation to receive care at this facility.  PASARR submitted to EDS on 12/07/2013 PASARR number received from EDS on 12/07/2013  FL2 transmitted to all facilities in geographic area requested by pt/family on  12/07/2013 FL2 transmitted to all facilities within larger geographic area on   Patient informed that his/her managed care company has contracts with or will negotiate with  certain facilities, including the following:     Patient/family informed of bed offers received:   Patient chooses bed at  Physician recommends and patient chooses bed at    Patient to be transferred to  on   Patient to be transferred to  facility by   The following physician request were entered in Epic:   Additional Comments:    Maryclare LabradorJulie Izsak Meir, MSW, [facility] HospitalCSWA Clinical Social Worker [phone]

## 2013-12-08 ENCOUNTER — Inpatient Hospital Stay (HOSPITAL_COMMUNITY): Payer: Medicare Other

## 2013-12-08 LAB — BASIC METABOLIC PANEL
BUN: 29 mg/dL — AB (ref 6–23)
BUN: 29 mg/dL — AB (ref 6–23)
CHLORIDE: 98 meq/L (ref 96–112)
CO2: 23 mEq/L (ref 19–32)
CO2: 25 mEq/L (ref 19–32)
CREATININE: 0.9 mg/dL (ref 0.50–1.35)
Calcium: 9 mg/dL (ref 8.4–10.5)
Calcium: 9 mg/dL (ref 8.4–10.5)
Chloride: 94 mEq/L — ABNORMAL LOW (ref 96–112)
Creatinine, Ser: 0.85 mg/dL (ref 0.50–1.35)
GFR calc non Af Amer: 82 mL/min — ABNORMAL LOW (ref >90)
GFR, EST NON AFRICAN AMERICAN: 80 mL/min — AB (ref >90)
GLUCOSE: 93 mg/dL (ref 70–99)
Glucose, Bld: 108 mg/dL — ABNORMAL HIGH (ref 70–99)
Potassium: 4 mEq/L (ref 3.7–5.3)
Potassium: 4.4 mEq/L (ref 3.7–5.3)
Sodium: 132 mEq/L — ABNORMAL LOW (ref 137–147)
Sodium: 136 mEq/L — ABNORMAL LOW (ref 137–147)

## 2013-12-08 LAB — CBC
HEMATOCRIT: 36.8 % — AB (ref 39.0–52.0)
HEMATOCRIT: 37.7 % — AB (ref 39.0–52.0)
Hemoglobin: 12.5 g/dL — ABNORMAL LOW (ref 13.0–17.0)
Hemoglobin: 12.8 g/dL — ABNORMAL LOW (ref 13.0–17.0)
MCH: 29.2 pg (ref 26.0–34.0)
MCH: 29.2 pg (ref 26.0–34.0)
MCHC: 34 g/dL (ref 30.0–36.0)
MCHC: 34 g/dL (ref 30.0–36.0)
MCV: 86 fL (ref 78.0–100.0)
MCV: 85.9 fL (ref 78.0–100.0)
Platelets: 270 10*3/uL (ref 150–400)
Platelets: 267 10*3/uL (ref 150–400)
RBC: 4.28 MIL/uL (ref 4.22–5.81)
RBC: 4.39 MIL/uL (ref 4.22–5.81)
RDW: 13.5 % (ref 11.5–15.5)
RDW: 13.5 % (ref 11.5–15.5)
WBC: 15 10*3/uL — ABNORMAL HIGH (ref 4.0–10.5)
WBC: 13 10*3/uL — ABNORMAL HIGH (ref 4.0–10.5)

## 2013-12-08 LAB — GLUCOSE, CAPILLARY: Glucose-Capillary: 97 mg/dL (ref 70–99)

## 2013-12-08 LAB — CARBOXYHEMOGLOBIN
Carboxyhemoglobin: 2.2 % — ABNORMAL HIGH (ref 0.5–1.5)
Methemoglobin: 1.1 % (ref 0.0–1.5)
O2 Saturation: 67 %
Total hemoglobin: 12.9 g/dL — ABNORMAL LOW (ref 13.5–18.0)

## 2013-12-08 LAB — TROPONIN I: Troponin I: <0.3 ng/mL (ref <0.30)

## 2013-12-08 MED ORDER — SPIRONOLACTONE 25 MG PO TABS
25.0000 mg | ORAL_TABLET | Freq: Every day | ORAL | Status: DC
  Administered 2013-12-08 – 2013-12-09 (×2): 25 mg via ORAL
  Filled 2013-12-08 (×2): qty 1

## 2013-12-08 MED ORDER — LISINOPRIL 2.5 MG PO TABS
2.5000 mg | ORAL_TABLET | Freq: Every day | ORAL | Status: DC
  Administered 2013-12-08 – 2013-12-09 (×2): 2.5 mg via ORAL
  Filled 2013-12-08 (×2): qty 1

## 2013-12-08 MED ORDER — POTASSIUM CHLORIDE CRYS ER 20 MEQ PO TBCR
40.0000 meq | EXTENDED_RELEASE_TABLET | Freq: Once | ORAL | Status: AC
  Administered 2013-12-08: 40 meq via ORAL
  Filled 2013-12-08: qty 2

## 2013-12-08 NOTE — Progress Notes (Signed)
CSW spoke to patient's sister Abbie SonsGaynell today to review bed offers and assist with placement issues.  Sister stated that she does not want her brother in a facility and plans to take him home at d/c with home health.  "I keep telling you people that I'm not placing him and he doesn't want to go!"  CSW provided support and stated that [facility] her to arrange home health. CSW discussed with sister that Home Health does not stay with patient- she verbalized an awareness of this and felt that she will have adequate support at home to manage his care.  CSW notified Angus Pal[place]illa [place], [facility] for follow-up. CSW will sign off but will be available should further assistance be required.  Lorri Frederickonna T. West PughCrowder, LCSWA  [phone]

## 2013-12-08 NOTE — Progress Notes (Signed)
Patient ID: CLEOFAS HUDGINS, male   DOB: [birth year], 79 y.o.   MRN: [id number]   SUBJECTIVE: Patient doing well today.  Denies chest pain.  Walking with PT.   Scheduled Meds: . atorvastatin  40 mg Oral q1800  . clopidogrel  75 mg Oral Q breakfast  . digoxin  0.125 mg Oral Daily  . furosemide  60 mg Intravenous BID  . levothyroxine  262 mcg Oral QAC breakfast  . lisinopril  2.5 mg Oral Daily  . Rivaroxaban  15 mg Oral BID WC  . spironolactone  12.5 mg Oral Daily   Continuous Infusions: . DOPamine Stopped (12/06/13 1530)   PRN Meds:.acetaminophen, ondansetron (ZOFRAN) IV, sodium chloride    Filed Vitals:   12/07/13 2033 12/08/13 0144 12/08/13 0420 12/08/13 0601  BP: 121/70 111/58  100/51  Pulse: 99 81  67  Temp: 97.2 F (36.2 C) 98.2 F (36.8 C)  97.3 F (36.3 C)  TempSrc: Oral Oral  Oral  Resp: 20 18  18   Height:      Weight:   82.464 kg (181 lb 12.8 oz)   SpO2: 97% 93%  99%    Intake/Output Summary (Last 24 hours) at 12/08/13 0806 Last data filed at 12/08/13 0419  Gross per 24 hour  Intake  40981 ml  Output   1975 ml  Net  27476 ml    LABS: Basic Metabolic Panel:  Recent Labs  19/14/78 1030 12/08/13 0435  NA 131* 132*  K 4.5 4.0  CL 97 94*  CO2 22 23  GLUCOSE 101* 108*  BUN 29* 29*  CREATININE 0.79 0.90  CALCIUM 8.8 9.0   Liver Function Tests: No results found for this basename: AST, ALT, ALKPHOS, BILITOT, PROT, ALBUMIN,  in the last 72 hours No results found for this basename: LIPASE, AMYLASE,  in the last 72 hours CBC:  Recent Labs  12/07/13 0515 12/08/13 0435  WBC 13.2* 15.0*  HGB 11.8* 12.5*  HCT 35.7* 36.8*  MCV 87.1 86.0  PLT 253 270   Cardiac Enzymes: No results found for this basename: CKTOTAL, CKMB, CKMBINDEX, TROPONINI,  in the last 72 hours BNP: No components found with this basename: POCBNP,  D-Dimer: No results found for this basename: DDIMER,  in the last 72 hours Hemoglobin A1C: No results found for this basename:  HGBA1C,  in the last 72 hours Fasting Lipid Panel: No results found for this basename: CHOL, HDL, LDLCALC, TRIG, CHOLHDL, LDLDIRECT,  in the last 72 hours Thyroid Function Tests: No results found for this basename: TSH, T4TOTAL, FREET3, T3FREE, THYROIDAB,  in the last 72 hours Anemia Panel: No results found for this basename: VITAMINB12, FOLATE, FERRITIN, TIBC, IRON, RETICCTPCT,  in the last 72 hours  RADIOLOGY: Dg Chest Port 1 View  11/26/2013   CLINICAL DATA:  Chest pain and congestive heart failure.  EXAM: PORTABLE CHEST - 1 VIEW  COMPARISON:  11/25/2013  FINDINGS: Previous median sternotomy and CABG procedure. The heart size is moderately enlarged. There is persistent airspace consolidation within the left upper lobe. Mild diffuse edema is identified elsewhere. Calcification within the left ventricular apex is noted which may be the sequelae of prior infarct  IMPRESSION: 1. Left upper lobe pneumonia. 2. Superimposed pulmonary edema compatible with CHF.   Electronically Signed   By: Signa Kell M.D.   On: 11/26/2013 10:55   Dg Chest Port 1 View  11/25/2013   CLINICAL DATA:  Congestive heart failure. Hypotension. Coronary artery disease.  EXAM: PORTABLE CHEST -  1 VIEW  COMPARISON:  11/24/13  FINDINGS: Left upper lobe airspace disease shows no significant interval change. Cardiomegaly and pulmonary vascular congestion are stable. No evidence of pleural effusion. Prior CABG again noted.  IMPRESSION: No significant change in left upper lobe airspace disease, suspicious for pneumonia.   Electronically Signed   By: [name]  Stahl M.D.   On: 11/25/2013 20:14    PHYSICAL EXAM General: NAD Neck: JVP 9-10 cm, no thyromegaly or thyroid nodule.  Lungs: Clear to auscultation bilaterally with normal respiratory effort. CV: Nondisplaced PMI.  Heart regular S1/S2, no S3/S4, 1/6 HSM apex.  1+ edema to knee right leg.  No carotid bruit.   Abdomen: Soft, nontender, no hepatosplenomegaly, no distention.    Neurologic: Alert and oriented x 3.  Psych: Normal affect. Extremities: No clubbing or cyanosis.   TELEMETRY: Reviewed telemetry pt in NSR   ASSESSMENT AND PLAN: 79 yo with history of CAD s/p CABG and chronic systolic CHF was admitted with NSTEMI.  He also has acute on chronic systolic CHF with low output and was found to have a RLE DVT.   1. CAD: NSTEMI with complex disease in SVG-OM, now s/p DES x 2 to SVG-OM. - Continue Plavix and statin.  Can stop ASA as long as he is on Xarelto.  Continue Plavix long-term. 2. Acute on chronic systolic CHF:  Ischemic cardiomyopathy.  Echo with EF 20-25%, diffuse hypokinesis, mild to moderate RV dysfunction, moderate pulmonary hypertension, moderate MR.  Was on milrinone for a couple of days.       - Continue digoxin and spironolactone.  - I will add low dose lisinopril 2.5 mg daily.  - Would continue Lasix 60 mg IV bid today.  I think he is still a little volume overloaded.  I/Os from yesterday are uninterpretable.  - Check co-ox. 3. RLE DVT: On Xarelto now, ?if he has PE, will determine length of Xarelto tx.  Would favor V/Q scan given high BUN to avoid contrast.   4. Encephalopathy: Improving.  5. Needs ongoing PT, needs SNF versus ?inpatient rehab.   Marca AnconaDalton Heatherly Stenner 12/08/2013 8:06 AM

## 2013-12-08 NOTE — Progress Notes (Signed)
Noted bloody right femoral dressing upon reassessment.  Per night RN, this has been ongoing since cath procedure on 12/07/2013.  Cardiology aware.  When redressing site, palpated small hematoma (approximately 1 cm) directly over site with slowly oozing blood.  Pressure held for 25 minutes; cath lab aware and sending PA.  Site redressed with pressure dressing.  Will continue to monitor.

## 2013-12-08 NOTE — Progress Notes (Signed)
Pt resting on bed comfortable, pt having frequent PVC and some runs of VT, VSS, no distress noticed. Pt groin site having some bleeding, dressing changed as need it. We'll continue to monitor.

## 2013-12-08 NOTE — Progress Notes (Signed)
Called to see for oozing Rt groin. Pressure held 30 minutes. He has a pressure dressing in place. He has already received Xarelto. Not much to do but observe.  Corine ShelterLUKE Jerimiah Wolman PA-C 12/08/2013 10:28 AM

## 2013-12-08 NOTE — Progress Notes (Signed)
Progress Note  James [name] WCB:[id number] DOB: [birth year] DOA: 11/24/2013 PCP: [name], DO  Brief narrative: 79 year old male with history of CAD status post CABG as well as congestive heart failure, grade 1 diastolic and systolic CHF with a decreased ejection fraction of 40% who was transferred from [facility] emergency room after presenting there on 2/20 with complaints of shortness of breath and lower extremity swelling x2 days. He was found to have a troponin of 3.5 and chest x-ray noted pneumonia and congestive heart failure so patient was transferred to the hospitalist service and placed in the step down unit. Patient's cardiologist is Dr. Percival Spanish of [facility] heart care and was consulted.   Attempts at diuresing were met with hypotension;  The patient's blood pressure dropped as low as 50s. Critical care was consulted initially for possible dopamine, however patient did respond to gentle fluid boluses. As troponins were trending down, pneumonia treated, and blood pressure stabilized, patient's mentation improved.   Assessment/Plan:  1-Acute respiratory failure with hypoxia :   A) Ischemic CM / Acute on chronic systolic heart failure   B) Left upper lobe pneumonia v/s pulmonary infarct  -diuresis has been limited by hypotension. -On IV lasix 60 mg IV BID.  -need to clarify if PE since if PE anticoagulation would be 6 months as opposed to 3 months. V-Q scan when stable.  -Antibiotics discontinued. Received 4 days of  Levaquin.  -Milrinone added by Cardiology but after weaned pt developed hypotension -did not tolerate when resumed 2/2 arrhythmia so Dopamine used to keep SBP>80-85-now being titrated down -Management of heart failure and ischemia per Cardiology team-resumed IV Lasix  2-Weakness; Patient not feeling well, low energy. Denies chest pain, dyspnea, abdominal pain, dizziness, no pain. He falls sleep in between conversation. Per family at staff this is baseline.  -Will  Get ABG, Stat CBC, B-met. Check Vitals. Family relates that when he doesn't feel well is usually secondary to new medications. Check CBG.  -Will monitor closely. Will inform cardio of patient change in condition.   Acute delirium -resolving. -Will Check CT head.   DVT of right lower extremity  -new finding this admission -Continue with xarelto.   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  -Received 4 days of Levaquin.  -Repeat cbc in am.   Hypotension -recurrent and seems r/t meds not sepsis (see above)  CKD  stage II -renal fnx stable post cath  Physical deconditioning -PT recommend skilled nursing facility but patient not sure this is what he wants -Continue to follow-hopefully will improve closer to discharge and may be able to then go home with PT  HYPERLIPIDEMIA  Hx of Essential hypertension, benign -BP soft - usual meds on hold  Hypothyroidism -cont Synthroid    DVT prophylaxis: IV heparin Code Status: Full Family Communication: cousin updated at bedside Disposition Plan/Expected LOS: Transfer to telemetry  Consultants: Cardiology  Procedures: 2-D echocardiogram  - Procedure narrative: Transthoracic echocardiography. Image quality was adequate. The study was technically difficult, as a result of poor acoustic windows and poor sound wave transmission. Intravenous contrast (Definity) was administered. - Left ventricle: The cavity size was severely dilated. Wall thickness was normal. Systolic function was severely reduced. The estimated ejection fraction was in  the range of 20% to 25%. Diffuse hypokinesis. Indeterminant diastolic function. - Aortic valve: There was no  stenosis. Trivial regurgitation. - Mitral valve: Mildly calcified annulus. Mildly calcified leaflets . Moderate central regurgitation. - Left atrium: The atrium was moderately dilated. - Right ventricle: The cavity size was mildly to moderately dilated. Systolic function was mildly reduced. - Right atrium: The atrium was mildly dilated. - Tricuspid valve: Peak RV-RA gradient: 37m Hg (S). - Pulmonary arteries: PA peak pressure: 620mHg (S). - Systemic veins: IVC measured 2.2 cm with < 50% respirophasic variation, suggesting RA pressure 15 mmHg.  Ankle-brachial index  Left ABI suggest mild, borderline arterial insufficiency. Unable to obtain right lower extremity pressures due to acute DVT. Unable to evaluate the right brachial artery due to bandaging.  Lower extremity venous duplex  preliminary results consistent with right lower extremity DVT involving posterior tibial and peroneal veins  Antibiotics: Levaquin 2/22 >>>2/25  HPI/Subjective: Patient not feeling well. Unable to verbalized why he doesn't feel well. Might feel weak.  He denies dyspnea, chest pain, abdominal pain. He falls sleep in between conversation. Per family this is baseline for him.   Objective: Blood pressure 104/55, pulse 62, temperature 97.3 F (36.3 C), temperature source Oral, resp. rate 18, height 5' 9"  (1.753 m), weight 82.464 kg (181 lb 12.8 oz), SpO2 97.00%.  Intake/Output Summary (Last 24 hours) at 12/08/13 1303 Last data filed at 12/08/13 1228  Gross per 24 hour  Intake  29531 ml  Output   2675 ml  Net  26856 ml   Exam: General: No acute respiratory distress Lungs: Clear to auscultation bilaterally without wheezes or crackles,  2 L Cardiovascular: Regular rate and rhythm without murmur gallop or rub normal S1 and S2,  Abdomen: Nontender, nondistended, soft, bowel sounds positive, no rebound, no ascites, no appreciable mass Musculoskeletal: No significant cyanosis, clubbing of bilateral lower  extremities-dressing over R groin post cath   Scheduled Meds:  Scheduled Meds: . atorvastatin  40 mg Oral q1800  . clopidogrel  75 mg Oral Q breakfast  . digoxin  0.125 mg Oral Daily  . furosemide  60 mg Intravenous BID  . levothyroxine  262 mcg Oral QAC breakfast  . lisinopril  2.5 mg Oral Daily  . Rivaroxaban  15 mg Oral BID WC  . spironolactone  25 mg Oral Daily   Continuous Infusions:    Data Reviewed: Basic Metabolic Panel:  Recent Labs Lab 12/04/13 0430  12/05/13 0500 12/06/13 0557 12/07/13 0515 12/07/13 1030 12/08/13 0435  NA  --   < > 139 136* 135* 131* 132*  K  --   < > 3.9 4.1 4.9 4.5 4.0  CL  --   < > 100 98 99 97 94*  CO2  --   < > 25 23 22 22 23   GLUCOSE  --   < > 112* 130* 89 101* 108*  BUN  --   < > 34* 33* 30* 29* 29*  CREATININE  --   < > 0.61 0.30* 0.78 0.79 0.90  CALCIUM  --   < > 9.0 9.2 8.8 8.8 9.0  MG 2.0  --   --   --   --   --   --   < > = values in this interval not displayed. Liver Function Tests: No results found for this basename: AST, ALT, ALKPHOS, BILITOT, PROT, ALBUMIN,  in the last 168 hours  CBC:  Recent Labs Lab 12/04/13 0431 12/05/13 0500 12/06/13 0557 12/07/13  0515 12/08/13 0435  WBC 12.3* 13.5* 15.9* 13.2* 15.0*  HGB 12.7* 12.3* 12.3* 11.8* 12.5*  HCT 37.1* 36.9* 36.9* 35.7* 36.8*  MCV 86.7 86.8 87.0 87.1 86.0  PLT 229 251 292 253 270    No results found for this or any previous visit (from the past 240 hour(s)).   Studies:  Recent x-ray studies have been reviewed in detail by the Attending Physician    **If unable to reach the above provider after paging please contact the Flow Manager @ [phone]  On-Call/Text Page:      [email or web address]      password [email or web address]  If 7PM-7AM, please contact night-coverage [email or web address] Password [email or web address] 12/08/2013, 1:03 PM   LOS: 14 days     I have examined the patient, reviewed the chart and modified the above note which I agree with.   Gwenyth Bouillon A Renly Guedes,MD [phone].  Pager  # on [email or web address] 12/08/2013, 1:03 PM

## 2013-12-08 NOTE — Progress Notes (Signed)
Per cath lab PA, right groin site is stable.  Will continue to monitor and maintain pressure dressing.  Contacted heart failure team regarding parameters for patient's ordered lisinopril.  Blood pressure 95/67, only hold with systolic pressure <85 per NP.

## 2013-12-09 ENCOUNTER — Inpatient Hospital Stay (HOSPITAL_COMMUNITY): Payer: Medicare Other

## 2013-12-09 LAB — BASIC METABOLIC PANEL WITH GFR
BUN: 27 mg/dL — ABNORMAL HIGH (ref 6–23)
CO2: 25 meq/L (ref 19–32)
Calcium: 8.9 mg/dL (ref 8.4–10.5)
Chloride: 94 meq/L — ABNORMAL LOW (ref 96–112)
Creatinine, Ser: 0.8 mg/dL (ref 0.50–1.35)
GFR calc Af Amer: >90 mL/min (ref >90)
GFR calc non Af Amer: 84 mL/min — ABNORMAL LOW (ref >90)
Glucose, Bld: 80 mg/dL (ref 70–99)
Potassium: 3.9 meq/L (ref 3.7–5.3)
Sodium: 133 meq/L — ABNORMAL LOW (ref 137–147)

## 2013-12-09 LAB — CBC
HCT: 36.4 % — ABNORMAL LOW (ref 39.0–52.0)
Hemoglobin: 12.3 g/dL — ABNORMAL LOW (ref 13.0–17.0)
MCH: 29.4 pg (ref 26.0–34.0)
MCHC: 33.8 g/dL (ref 30.0–36.0)
MCV: 86.9 fL (ref 78.0–100.0)
Platelets: 264 K/uL (ref 150–400)
RBC: 4.19 MIL/uL — ABNORMAL LOW (ref 4.22–5.81)
RDW: 13.6 % (ref 11.5–15.5)
WBC: 12.8 K/uL — ABNORMAL HIGH (ref 4.0–10.5)

## 2013-12-09 LAB — MAGNESIUM: Magnesium: 1.9 mg/dL (ref 1.5–2.5)

## 2013-12-09 LAB — LACTIC ACID, PLASMA: Lactic Acid, Venous: 1.3 mmol/L (ref 0.5–2.2)

## 2013-12-09 MED ORDER — TECHNETIUM TC 99M DIETHYLENETRIAME-PENTAACETIC ACID: 40.0000 | Freq: Once | INTRAVENOUS | Status: AC | PRN

## 2013-12-09 MED ORDER — TECHNETIUM TO 99M ALBUMIN AGGREGATED
6.0000 | Freq: Once | INTRAVENOUS | Status: AC | PRN
  Administered 2013-12-09: 6 via INTRAVENOUS

## 2013-12-09 NOTE — Progress Notes (Addendum)
Pt having multiple runs of 7-9 runs of V tach asymptomatic, cards PA made aware. Orders to check mag level. Cont to monitor.

## 2013-12-09 NOTE — Progress Notes (Signed)
Progress Note  James [name] RPR:[id number] DOB: May 16, 1935 DOA: 11/24/2013 PCP: [name], DO  Brief narrative: 79 year old male with history of CAD status post CABG as well as congestive heart failure, grade 1 diastolic and systolic CHF with a decreased ejection fraction of 40% who was transferred from [facility] emergency room after presenting there on 2/20 with complaints of shortness of breath and lower extremity swelling x2 days. He was found to have a troponin of 3.5 and chest x-ray noted pneumonia and congestive heart failure so patient was transferred to the hospitalist service and placed in the step down unit. Patient's cardiologist is Dr. Percival Spanish of [facility] heart care and was consulted.   Attempts at diuresing were met with hypotension;  The patient's blood pressure dropped as low as 50s. Critical care was consulted initially for possible dopamine, however patient did respond to gentle fluid boluses. As troponins were trending down, pneumonia treated, and blood pressure stabilized, patient's mentation improved.   Assessment/Plan:  1-Acute respiratory failure with hypoxia :   A) Ischemic CM / Acute on chronic systolic heart failure   B) Left upper lobe pneumonia v/s pulmonary infarct  -diuresis has been limited by hypotension. -On IV lasix 60 mg IV BID.  -V-Q scan low probability for PE.  -Antibiotics discontinued. Received 4 days of  Levaquin. WBC trending down. Chest x-ray infiltrates improving.  -Milrinone added by Cardiology but after weaned pt developed hypotension -did not tolerate when resumed 2/2 arrhythmia so Dopamine used to keep SBP>80-85-now being titrated down -Management of heart failure and ischemia per Cardiology team-resumed IV Lasix. -Will hold lasix if BP less than 100. SBP in the 80.   2-Weakness; Patient not feeling well on 3-6, low energy. Denies chest pain, dyspnea, abdominal pain, dizziness, no pain. He falls sleep in between conversation. Per family  at staff this is baseline.  -ABG no hypercapnia,  -Feeling better today. Resolved.   Acute delirium -resolving. - CT head. No acute intracranial abnormalities.   DVT of right lower extremity  -new finding this admission -Continue with xarelto.   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  -Received 4 days of Levaquin.  -Repeat cbc in am.   Hypotension -recurrent and seems r/t meds not sepsis (see above) -will hold on ACE. Received dose today. Holder parameters for lasix. Will defer bolus to cardio.   CKD  stage II -renal fnx stable post cath  Physical deconditioning -PT recommend skilled nursing facility but patient not sure this is what he wants -Continue to follow-hopefully will improve closer to discharge and may be able to then go home with PT  HYPERLIPIDEMIA  Hx of Essential hypertension, benign -BP soft - usual meds on hold  Hypothyroidism -cont Synthroid    DVT prophylaxis: IV heparin Code Status: Full Family Communication: sister 3-6.  Disposition Plan/Expected LOS: remain inpatient.   Consultants: Cardiology  Procedures: 2-D echocardiogram  - Procedure narrative: Transthoracic echocardiography. Image quality was adequate. The study was technically difficult, as a result of poor acoustic windows and poor sound wave transmission. Intravenous contrast (Definity) was administered. - Left ventricle: The cavity size was severely dilated. Wall thickness was normal. Systolic function was severely reduced. The estimated ejection fraction was in the range of  20% to 25%. Diffuse hypokinesis. Indeterminant diastolic function. - Aortic valve: There was no stenosis.  Trivial regurgitation. - Mitral valve: Mildly calcified annulus. Mildly calcified leaflets . Moderate central regurgitation. - Left atrium: The atrium was moderately dilated. - Right ventricle: The cavity size was mildly to moderately dilated. Systolic function was mildly reduced. - Right atrium: The atrium was mildly dilated. - Tricuspid valve: Peak RV-RA gradient: 9m Hg (S). - Pulmonary arteries: PA peak pressure: 661mHg (S). - Systemic veins: IVC measured 2.2 cm with < 50% respirophasic variation, suggesting RA pressure 15 mmHg.  Ankle-brachial index  Left ABI suggest mild, borderline arterial insufficiency. Unable to obtain right lower extremity pressures due to acute DVT. Unable to evaluate the right brachial artery due to bandaging.  Lower extremity venous duplex  preliminary results consistent with right lower extremity DVT involving posterior tibial and peroneal veins  Antibiotics: Levaquin 2/22 >>>2/25  HPI/Subjective: Patient sitting in bed, eating lunch. He feels great today.   Objective: Blood pressure 78/41, pulse 80, temperature 98 F (36.7 C), temperature source Oral, resp. rate 18, height 5' 9"  (1.753 m), weight 79.516 kg (175 lb 4.8 oz), SpO2 99.00%.  Intake/Output Summary (Last 24 hours) at 12/09/13 1541 Last data filed at 12/09/13 1349  Gross per 24 hour  Intake    678 ml  Output   1300 ml  Net   -622 ml   Exam: General: No acute respiratory distress Lungs: Clear to auscultation bilaterally without wheezes or crackles,  2 L Cardiovascular: Regular rate and rhythm without murmur gallop or rub normal S1 and S2,  Abdomen: Nontender, nondistended, soft, bowel sounds positive, no rebound, no ascites, no appreciable mass Musculoskeletal: No significant cyanosis, clubbing of bilateral lower extremities-dressing over R groin post cath   Scheduled Meds:  Scheduled Meds: . atorvastatin  40 mg Oral q1800  . clopidogrel  75 mg Oral Q breakfast  . digoxin   0.125 mg Oral Daily  . furosemide  60 mg Intravenous BID  . levothyroxine  262 mcg Oral QAC breakfast  . Rivaroxaban  15 mg Oral BID WC   Continuous Infusions:    Data Reviewed: Basic Metabolic Panel:  Recent Labs Lab 12/04/13 0430  12/07/13 0515 12/07/13 1030 12/08/13 0435 12/08/13 1314 12/09/13 0455  NA  --   < > 135* 131* 132* 136* 133*  K  --   < > 4.9 4.5 4.0 4.4 3.9  CL  --   < > 99 97 94* 98 94*  CO2  --   < > 22 22 23 25 25   GLUCOSE  --   < > 89 101* 108* 93 80  BUN  --   < > 30* 29* 29* 29* 27*  CREATININE  --   < > 0.78 0.79 0.90 0.85 0.80  CALCIUM  --   < > 8.8 8.8 9.0 9.0 8.9  MG 2.0  --   --   --   --   --   --   < > = values in this interval not displayed. Liver Function Tests: No results found for this basename: AST, ALT, ALKPHOS, BILITOT, PROT, ALBUMIN,  in the last 168 hours  CBC:  Recent Labs Lab 12/06/13 0557 12/07/13 0515 12/08/13 0435 12/08/13 1258 12/09/13 0455  WBC 15.9* 13.2* 15.0* 13.0* 12.8*  HGB 12.3* 11.8* 12.5* 12.8* 12.3*  HCT 36.9* 35.7* 36.8* 37.7* 36.4*  MCV 87.0 87.1 86.0 85.9 86.9  PLT 292 253 270 267 264    No  results found for this or any previous visit (from the past 240 hour(s)).   Studies:  Recent x-ray studies have been reviewed in detail by the Attending Physician    If 7PM-7AM, please contact night-coverage [email or web address] Password [email or web address] 12/09/2013, 3:41 PM   LOS: 15 days     I have examined the patient, reviewed the chart and modified the above note which I agree with.   Gwenyth Bouillon A Erion Weightman,MD [phone].  Pager # on [email or web address] 12/09/2013, 3:41 PM

## 2013-12-09 NOTE — Progress Notes (Signed)
Patient ID: James [name], male   DOB: Sep 13, 1935, 79 y.o.   MRN: [id number]   SUBJECTIVE:  No complaints  Groin site better   Scheduled Meds: . atorvastatin  40 mg Oral q1800  . clopidogrel  75 mg Oral Q breakfast  . digoxin  0.125 mg Oral Daily  . furosemide  60 mg Intravenous BID  . levothyroxine  262 mcg Oral QAC breakfast  . lisinopril  2.5 mg Oral Daily  . Rivaroxaban  15 mg Oral BID WC  . spironolactone  25 mg Oral Daily   Continuous Infusions:   PRN Meds:.acetaminophen, ondansetron (ZOFRAN) IV, sodium chloride    Filed Vitals:   12/08/13 1344 12/08/13 2037 12/09/13 0342 12/09/13 0408  BP: 106/68 97/53  100/58  Pulse: 77 102  52  Temp: 98.1 F (36.7 C) 97.9 F (36.6 C)  97.8 F (36.6 C)  TempSrc:  Oral  Oral  Resp: 18 18  18   Height:      Weight:   175 lb 4.8 oz (79.516 kg)   SpO2: 94% 99%  98%    Intake/Output Summary (Last 24 hours) at 12/09/13 0853 Last data filed at 12/09/13 0242  Gross per 24 hour  Intake    838 ml  Output   1900 ml  Net  -1062 ml    LABS: Basic Metabolic Panel:  Recent Labs  95/62/1302/03/19 1314 12/09/13 0455  NA 136* 133*  K 4.4 3.9  CL 98 94*  CO2 25 25  GLUCOSE 93 80  BUN 29* 27*  CREATININE 0.85 0.80  CALCIUM 9.0 8.9   CBC:  Recent Labs  12/08/13 1258 12/09/13 0455  WBC 13.0* 12.8*  HGB 12.8* 12.3*  HCT 37.7* 36.4*  MCV 85.9 86.9  PLT 267 264   Cardiac Enzymes:  Recent Labs  12/08/13 1400  TROPONINI <0.30    RADIOLOGY: Dg Chest Port 1 View  11/26/2013   CLINICAL DATA:  Chest pain and congestive heart failure.  EXAM: PORTABLE CHEST - 1 VIEW  COMPARISON:  11/25/2013  FINDINGS: Previous median sternotomy and CABG procedure. The heart size is moderately enlarged. There is persistent airspace consolidation within the left upper lobe. Mild diffuse edema is identified elsewhere. Calcification within the left ventricular apex is noted which may be the sequelae of prior infarct  IMPRESSION: 1. Left upper lobe  pneumonia. 2. Superimposed pulmonary edema compatible with CHF.   Electronically Signed   By: Signa Kellaylor  Stroud M.D.   On: 11/26/2013 10:55   Dg Chest Port 1 View  11/25/2013   CLINICAL DATA:  Congestive heart failure. Hypotension. Coronary artery disease.  EXAM: PORTABLE CHEST - 1 VIEW  COMPARISON:  11/24/13  FINDINGS: Left upper lobe airspace disease shows no significant interval change. Cardiomegaly and pulmonary vascular congestion are stable. No evidence of pleural effusion. Prior CABG again noted.  IMPRESSION: No significant change in left upper lobe airspace disease, suspicious for pneumonia.   Electronically Signed   By: [name]  Stahl M.D.   On: 11/25/2013 20:14    PHYSICAL EXAM General: NAD Neck: JVP 9-10 cm, no thyromegaly or thyroid nodule.  Lungs: Clear to auscultation bilaterally with normal respiratory effort. CV: Nondisplaced PMI.  Heart regular S1/S2, no S3/S4, 1/6 HSM apex.  1+ edema to knee right leg.  No carotid bruit.   Abdomen: Soft, nontender, no hepatosplenomegaly, no distention.  Neurologic: Alert and oriented x 3.  Psych: Normal affect. Extremities: No clubbing or cyanosis.  Cath site with no more oozing  TELEMETRY: Reviewed telemetry pt in NSR   ASSESSMENT AND PLAN: 79 yo with history of CAD s/p CABG and chronic systolic CHF was admitted with NSTEMI.  He also has acute on chronic systolic CHF with low output and was found to have a RLE DVT.   1. CAD: NSTEMI with complex disease in SVG-OM, now s/p DES x 2 to SVG-OM. - Continue Plavix and statin.  Can stop ASA as long as he is on Xarelto.  Continue Plavix long-term. 2. Acute on chronic systolic CHF:  Ischemic cardiomyopathy.  Echo with EF 20-25%, diffuse hypokinesis, mild to moderate RV dysfunction, moderate pulmonary hypertension, moderate MR.  Was on milrinone for a couple of days.       - Continue digoxin and spironolactone.  -  Low dose ACE started yesterday  - Would continue Lasix 60 mg IV bid today.  I think he is  still a little volume overloaded.  I/Os from yesterday are uninterpretable.  - Check co-ox. 3. RLE DVT: On Xarelto now, ?if he has PE, will determine length of Xarelto tx.  V/Q scan ordered for today    4. Encephalopathy: Lethargic this am  5. Needs ongoing PT, needs SNF versus Inpatient rehab which appears to be best option   Charlton Haws 12/09/2013 8:53 AM

## 2013-12-09 NOTE — Progress Notes (Signed)
Pt. BP soft pt asymptomatic MD made aware, no orders received, cont to monitor BP

## 2013-12-09 NOTE — Progress Notes (Signed)
Pt BP 80/40's PA hager notified no new orders received. Pt states he is asymptomatic.

## 2013-12-10 LAB — CORTISOL: CORTISOL PLASMA: 24.6 ug/dL

## 2013-12-10 NOTE — Discharge Instructions (Signed)
Information on my medicine - XARELTO (rivaroxaban)  This medication education was reviewed with me or my healthcare representative as part of my discharge preparation.  The pharmacist that spoke with me during my hospital stay was:  Kaylyn LayerCope, [facility]  WHY WAS XARELTO PRESCRIBED FOR YOU? Xarelto was prescribed to treat blood clots that may have been found in the veins of your legs (deep vein thrombosis) or in your lungs (pulmonary embolism) and to reduce the risk of them occurring again.  What do you need to know about Xarelto? The starting dose is one 15 mg tablet taken TWICE daily with food for the FIRST 21 DAYS then the dose is changed to one 20 mg tablet taken ONCE A DAY with your evening meal.  DO NOT stop taking Xarelto without talking to the health care provider who prescribed the medication.  Refill your prescription for 20 mg tablets before you run out.  After discharge, you should have regular check-up appointments with your healthcare provider that is prescribing your Xarelto.  In the future your dose may need to be changed if your kidney function changes by a significant amount.  What do you do if you miss a dose? If you are taking Xarelto TWICE DAILY and you miss a dose, take it as soon as you remember. You may take two 15 mg tablets (total 30 mg) at the same time then resume your regularly scheduled 15 mg twice daily the next day.  If you are taking Xarelto ONCE DAILY and you miss a dose, take it as soon as you remember on the same day then continue your regularly scheduled once daily regimen the next day. Do not take two doses of Xarelto at the same time.   Important Safety Information Xarelto is a blood thinner medicine that can cause bleeding. You should call your healthcare provider right away if you experience any of the following:   Bleeding from an injury or your nose that does not stop.   Unusual colored urine (red or dark brown) or unusual colored stools (red  or black).   Unusual bruising for unknown reasons.   A serious fall or if you hit your head (even if there is no bleeding).  Some medicines may interact with Xarelto and might increase your risk of bleeding while on Xarelto. To help avoid this, consult your healthcare provider or pharmacist prior to using any new prescription or non-prescription medications, including herbals, vitamins, non-steroidal anti-inflammatory drugs (NSAIDs) and supplements.  This website has more information on Xarelto: [email or web address].xarelto.[email or web address].

## 2013-12-10 NOTE — Progress Notes (Signed)
Sister at the bedside, updated on plan of care.

## 2013-12-10 NOTE — Progress Notes (Signed)
Progress Note  James [name] [facility]:[id number] DOB: 04/03/1935 DOA: 11/24/2013 PCP: [name], DO  Brief narrative: 79 year old male with history of CAD status post CABG as well as congestive heart failure, grade 1 diastolic and systolic CHF with a decreased ejection fraction of 40% who was transferred from [facility] emergency room after presenting there on 2/20 with complaints of shortness of breath and lower extremity swelling x2 days. He was found to have a troponin of 3.5 and chest x-ray noted pneumonia and congestive heart failure so patient was transferred to the hospitalist service and placed in the step down unit. Patient's cardiologist is Dr. Percival Spanish of [facility] heart care and was consulted.   Attempts at diuresing were met with hypotension;  The patient's blood pressure dropped as low as 50s. Critical care was consulted initially for possible dopamine, however patient did respond to gentle fluid boluses. As troponins were trending down, pneumonia treated, and blood pressure stabilized, patient's mentation improved.   Assessment/Plan:  1-Acute respiratory failure with hypoxia :   A) Ischemic CM / Acute on chronic systolic heart failure EF 20 %   B) Left upper lobe pneumonia v/s pulmonary infarct  -diuresis has been limited by hypotension. -On IV lasix 60 mg IV BID. Didn't received last night dose due to hypotension.  -V-Q scan low probability for PE. Anticoagulation for 6 months.  -Antibiotics discontinued. Received 4 days of  Levaquin. WBC trending down. Chest x-ray infiltrates improving.  -Milrinone added by Cardiology but after weaned pt developed hypotension -did not tolerate when resumed 2/2 arrhythmia so Dopamine used to keep SBP>80-85-now being titrated down -Management of heart failure and ischemia per Cardiology team- -Discussed with Dr Johnsie Cancel will give at least give one time dose of lasix.   Hypotension -recurrent not sepsis (see above) -Continue to hold ACE,  spironolactone.  -Repeat lactic acid norma.  -I suspect cardiogenic in origin.  -Will check cortisol level.   2-Weakness; Patient not feeling well on 3-6, low energy. Denies chest pain, dyspnea, abdominal pain, dizziness, no pain. He falls sleep in between conversation. Per family at staff this is baseline.  -ABG no hypercapnia,  -Feeling better today. Resolved.   Acute delirium -resolving. - CT head. No acute intracranial abnormalities.   DVT of right lower extremity  -new finding this admission -Continue with xarelto.   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  -Received 4 days of Levaquin.  -WBC stable.   CKD  stage II -renal fnx stable post cath  Physical deconditioning -PT recommend skilled nursing facility but patient not sure this is what he wants -Sister wants to take him home.   HYPERLIPIDEMIA  Hx of Essential hypertension, benign -BP soft - usual meds on hold  Hypothyroidism -cont Synthroid    DVT prophylaxis: Xarelto.  Code Status: Full Family Communication: sister 3-6.  Disposition Plan/Expected LOS: remain inpatient.   Consultants: Cardiology  Procedures: 2-D echocardiogram  - Procedure narrative: Transthoracic echocardiography. Image quality was adequate. The study was technically difficult, as a result of poor acoustic windows and poor sound wave transmission. Intravenous contrast (Definity) was administered. - Left ventricle: The cavity size was severely dilated. Wall thickness was normal. Systolic function was severely reduced. The estimated ejection fraction was in  the range of 20% to 25%. Diffuse hypokinesis. Indeterminant diastolic function. - Aortic  valve: There was no stenosis. Trivial regurgitation. - Mitral valve: Mildly calcified annulus. Mildly calcified leaflets . Moderate central regurgitation. - Left atrium: The atrium was moderately dilated. - Right ventricle: The cavity size was mildly to moderately dilated. Systolic function was mildly reduced. - Right atrium: The atrium was mildly dilated. - Tricuspid valve: Peak RV-RA gradient: 65m Hg (S). - Pulmonary arteries: PA peak pressure: 653mHg (S). - Systemic veins: IVC measured 2.2 cm with < 50% respirophasic variation, suggesting RA pressure 15 mmHg.  Ankle-brachial index  Left ABI suggest mild, borderline arterial insufficiency. Unable to obtain right lower extremity pressures due to acute DVT. Unable to evaluate the right brachial artery due to bandaging.  Lower extremity venous duplex  preliminary results consistent with right lower extremity DVT involving posterior tibial and peroneal veins  Antibiotics: Levaquin 2/22 >>>2/25  HPI/Subjective: Sitting in chair. Denies chest pain, dizziness, lightheadness.   Objective: Blood pressure 75/46, pulse 84, temperature 97.8 F (36.6 C), temperature source Oral, resp. rate 18, height _0  (1.753 m), weight 81.784 kg (180 lb 4.8 oz), SpO2 93.00%.  Intake/Output Summary (Last 24 hours) at 12/10/13 1121 Last data filed at 12/10/13 0815  Gross per 24 hour  Intake    480 ml  Output    952 ml  Net   -472 ml   Exam: General: No acute respiratory distress Lungs: Clear to auscultation bilaterally without wheezes or crackles,  2 L Cardiovascular: Regular rate and rhythm without murmur gallop or rub normal S1 and S2,  Abdomen: Nontender, nondistended, soft, bowel sounds positive, no rebound, no ascites, no appreciable mass Musculoskeletal: No significant cyanosis, clubbing of bilateral lower extremities-dressing over R groin post cath   Scheduled Meds:  Scheduled Meds: . atorvastatin  40 mg Oral q1800  . clopidogrel   75 mg Oral Q breakfast  . digoxin  0.125 mg Oral Daily  . furosemide  60 mg Intravenous BID  . levothyroxine  262 mcg Oral QAC breakfast  . Rivaroxaban  15 mg Oral BID WC   Continuous Infusions:    Data Reviewed: Basic Metabolic Panel:  Recent Labs Lab 12/04/13 0430  12/07/13 0515 12/07/13 1030 12/08/13 0435 12/08/13 1314 12/09/13 0455 12/09/13 1430  NA  --   < > 135* 131* 132* 136* 133*  --   K  --   < > 4.9 4.5 4.0 4.4 3.9  --   CL  --   < > 99 97 94* 98 94*  --   CO2  --   < > _1 --   GLUCOSE  --   < > 89 101* 108* 93 80  --   BUN  --   < > 30* 29* 29* 29* 27*  --   CREATININE  --   < > 0.78 0.79 0.90 0.85 0.80  --   CALCIUM  --   < > 8.8 8.8 9.0 9.0 8.9  --   MG 2.0  --   --   --   --   --   --  1.9  < > = values in this interval not displayed. Liver Function Tests: No results found for this basename: AST, ALT, ALKPHOS, BILITOT, PROT, ALBUMIN,  in the last 168 hours  CBC:  Recent Labs Lab 12/06/13 0557 12/07/13 0515 12/08/13 0435 12/08/13 1258 12/09/13 0455  WBC 15.9* 13.2* 15.0* 13.0* 12.8*  HGB 12.3*  11.8* 12.5* 12.8* 12.3*  HCT 36.9* 35.7* 36.8* 37.7* 36.4*  MCV 87.0 87.1 86.0 85.9 86.9  PLT 292 253 270 267 264    No results found for this or any previous visit (from the past 240 hour(s)).   Studies:  Recent x-ray studies have been reviewed in detail by the Attending Physician    If 7PM-7AM, please contact night-coverage [email or web address] Password [email or web address] 12/10/2013, 11:21 AM   LOS: 16 days     I have examined the patient, reviewed the chart and modified the above note which I agree with.   Gwenyth Bouillon A Deagan Sevin,MD [phone].  Pager # [email or web address] 12/10/2013, 11:21 AM

## 2013-12-10 NOTE — Progress Notes (Signed)
Patient ID: James [name], male   DOB: 1935/03/14, 79 y.o.   MRN: [id number]   SUBJECTIVE:  No complaints  Groin site better   Scheduled Meds: . atorvastatin  40 mg Oral q1800  . clopidogrel  75 mg Oral Q breakfast  . digoxin  0.125 mg Oral Daily  . furosemide  60 mg Intravenous BID  . levothyroxine  262 mcg Oral QAC breakfast  . Rivaroxaban  15 mg Oral BID WC   Continuous Infusions:   PRN Meds:.acetaminophen, ondansetron (ZOFRAN) IV, sodium chloride    Filed Vitals:   12/09/13 1819 12/09/13 2034 12/10/13 0527 12/10/13 0800  BP: 80/58 84/76 78/54  86/54  Pulse:  72 64 80  Temp:  97.5 F (36.4 C) 97.8 F (36.6 C)   TempSrc:  Oral Oral   Resp:  18 18   Height:   5\' 9"  (1.753 m)   Weight:   180 lb 4.8 oz (81.784 kg)   SpO2:  95% 93%     Intake/Output Summary (Last 24 hours) at 12/10/13 0841 Last data filed at 12/10/13 0815  Gross per 24 hour  Intake    480 ml  Output    950 ml  Net   -470 ml    LABS: Basic Metabolic Panel:  Recent Labs  40/98/11 1314 12/09/13 0455 12/09/13 1430  NA 136* 133*  --   K 4.4 3.9  --   CL 98 94*  --   CO2 25 25  --   GLUCOSE 93 80  --   BUN 29* 27*  --   CREATININE 0.85 0.80  --   CALCIUM 9.0 8.9  --   MG  --   --  1.9   CBC:  Recent Labs  12/08/13 1258 12/09/13 0455  WBC 13.0* 12.8*  HGB 12.8* 12.3*  HCT 37.7* 36.4*  MCV 85.9 86.9  PLT 267 264   Cardiac Enzymes:  Recent Labs  12/08/13 1400  TROPONINI <0.30    RADIOLOGY: Dg Chest Port 1 View  11/26/2013   CLINICAL DATA:  Chest pain and congestive heart failure.  EXAM: PORTABLE CHEST - 1 VIEW  COMPARISON:  11/25/2013  FINDINGS: Previous median sternotomy and CABG procedure. The heart size is moderately enlarged. There is persistent airspace consolidation within the left upper lobe. Mild diffuse edema is identified elsewhere. Calcification within the left ventricular apex is noted which may be the sequelae of prior infarct  IMPRESSION: 1. Left upper lobe  pneumonia. 2. Superimposed pulmonary edema compatible with CHF.   Electronically Signed   By: Signa Kell M.D.   On: 11/26/2013 10:55   Dg Chest Port 1 View  11/25/2013   CLINICAL DATA:  Congestive heart failure. Hypotension. Coronary artery disease.  EXAM: PORTABLE CHEST - 1 VIEW  COMPARISON:  11/24/13  FINDINGS: Left upper lobe airspace disease shows no significant interval change. Cardiomegaly and pulmonary vascular congestion are stable. No evidence of pleural effusion. Prior CABG again noted.  IMPRESSION: No significant change in left upper lobe airspace disease, suspicious for pneumonia.   Electronically Signed   By: Myles Rosenthal M.D.   On: 11/25/2013 20:14    PHYSICAL EXAM General: NAD Neck: JVP 9-10 cm, no thyromegaly or thyroid nodule.  Lungs: Clear to auscultation bilaterally with normal respiratory effort. CV: Nondisplaced PMI.  Heart regular S1/S2, no S3/S4, 1/6 HSM apex.  1+ edema to knee right leg.  No carotid bruit.   Abdomen: Soft, nontender, no hepatosplenomegaly, no distention.  Neurologic: Alert and  oriented x 3.  Psych: Normal affect. Extremities: No clubbing or cyanosis.  Cath site with no more oozing   TELEMETRY: Reviewed telemetry pt in NSR   ASSESSMENT AND PLAN: 79 yo with history of CAD s/p CABG and chronic systolic CHF was admitted with NSTEMI.  He also has acute on chronic systolic CHF with low output and was found to have a RLE DVT.   1. CAD: NSTEMI with complex disease in SVG-OM, now s/p DES x 2 to SVG-OM. - Continue Plavix and statin.  Can stop ASA as long as he is on Xarelto.  Continue Plavix long-term. 2. Acute on chronic systolic CHF:  Ischemic cardiomyopathy.  Echo with EF 20-25%, diffuse hypokinesis, mild to moderate RV dysfunction, moderate pulmonary hypertension, moderate MR.  Was on milrinone for a couple of days.       - Continue digoxin and lasix  Aldactone and ACE held due to low BP   3. RLE DVT: On Xarelto now, V/Q low prob for PE so duration of Rx  6 months then resume ASA     4. Encephalopathy: slow improvement  5. Needs ongoing PT, needs SNF versus Inpatient rehab which appears to be best option   Charlton Hawseter Sher Hellinger 12/10/2013 8:41 AM

## 2013-12-10 NOTE — Progress Notes (Signed)
Pt SBP remains in the 70s, called cards on call hold IV lasix.

## 2013-12-11 LAB — BLOOD GAS, ARTERIAL
ACID-BASE EXCESS: 2.1 mmol/L — AB (ref 0.0–2.0)
BICARBONATE: 24.9 meq/L — AB (ref 20.0–24.0)
Drawn by: 12971
FIO2: 0.21 %
O2 SAT: 94.8 %
PO2 ART: 68.7 mmHg — AB (ref 80.0–100.0)
Patient temperature: 98.6
TCO2: 25.8 mmol/L (ref 0–100)
pCO2 arterial: 30.4 mmHg — ABNORMAL LOW (ref 35.0–45.0)
pH, Arterial: 7.523 — ABNORMAL HIGH (ref 7.350–7.450)

## 2013-12-11 LAB — CBC
HCT: 35.9 % — ABNORMAL LOW (ref 39.0–52.0)
HEMOGLOBIN: 12.1 g/dL — AB (ref 13.0–17.0)
MCH: 28.6 pg (ref 26.0–34.0)
MCHC: 33.7 g/dL (ref 30.0–36.0)
MCV: 84.9 fL (ref 78.0–100.0)
Platelets: 295 10*3/uL (ref 150–400)
RBC: 4.23 MIL/uL (ref 4.22–5.81)
RDW: 13.6 % (ref 11.5–15.5)
WBC: 12.4 10*3/uL — AB (ref 4.0–10.5)

## 2013-12-11 LAB — BASIC METABOLIC PANEL
BUN: 24 mg/dL — ABNORMAL HIGH (ref 6–23)
CHLORIDE: 95 meq/L — AB (ref 96–112)
CO2: 21 meq/L (ref 19–32)
Calcium: 8.4 mg/dL (ref 8.4–10.5)
Creatinine, Ser: 0.69 mg/dL (ref 0.50–1.35)
GFR calc Af Amer: >90 mL/min (ref >90)
GFR calc non Af Amer: 89 mL/min — ABNORMAL LOW (ref >90)
GLUCOSE: 96 mg/dL (ref 70–99)
POTASSIUM: 4.1 meq/L (ref 3.7–5.3)
SODIUM: 128 meq/L — AB (ref 137–147)

## 2013-12-11 MED ORDER — SPIRONOLACTONE 12.5 MG HALF TABLET
12.5000 mg | ORAL_TABLET | Freq: Every day | ORAL | Status: DC
  Administered 2013-12-11 – 2013-12-14 (×4): 12.5 mg via ORAL
  Filled 2013-12-11 (×4): qty 1

## 2013-12-11 NOTE — Progress Notes (Signed)
UR completed Avalene Sealy K. Ewald Beg, RN, BSN, MSHL, CCM  12/11/2013 3:05 PM

## 2013-12-11 NOTE — Progress Notes (Addendum)
Progress Note  James [name] UEK:[id number] DOB: 03-05-1935 DOA: 11/24/2013 PCP: [name], DO  Brief narrative: 79 year old male with history of CAD status post CABG as well as congestive heart failure, grade 1 diastolic and systolic CHF with a decreased ejection fraction of 40% who was transferred from [facility] emergency room after presenting there on 2/20 with complaints of shortness of breath and lower extremity swelling x2 days. He was found to have a troponin of 3.5 and chest x-ray noted pneumonia and congestive heart failure so patient was transferred to the hospitalist service and placed in the step down unit. Patient's cardiologist is Dr. Percival Spanish of [facility] heart care and was consulted.   Attempts at diuresing were met with hypotension;  The patient's blood pressure dropped as low as 50s. Critical care was consulted initially for possible dopamine, however patient did respond to gentle fluid boluses. As troponins were trending down, pneumonia treated, and blood pressure stabilized, patient's mentation improved.   Assessment/Plan:  1-Acute respiratory failure with hypoxia :   A) Ischemic CM / Acute on chronic systolic heart failure EF 20 %   B) Left upper lobe pneumonia v/s pulmonary infarct  -diuresis has been limited by hypotension. -On IV lasix 60 mg IV BID. Diuresis limited by hypotension.  -V-Q scan low probability for PE. Anticoagulation for 6 months.  -Antibiotics discontinued. Received 4 days of  Levaquin. WBC trending down. Chest x-ray infiltrates improving.  -Milrinone added by Cardiology but after weaned pt developed hypotension -did not tolerate when resumed 2/2 arrhythmia so Dopamine used to keep SBP>80-85-now being titrated down -Management of heart failure and ischemia per Cardiology team-  Hyponatremia; suspect secondary to HF.   Hypotension -recurrent not sepsis (see above) -Continue to hold ACE, spironolactone.  -Repeat lactic acid norma.  -I suspect  cardiogenic in origin.  -cortisol level 24 normal.    2-Weakness; Patient not feeling well on 3-6, low energy. Denies chest pain, dyspnea, abdominal pain, dizziness, no pain. He falls sleep in between conversation. Per family at staff this is baseline.  -ABG no hypercapnia,  - Resolved.   Acute delirium -resolving. - CT head. No acute intracranial abnormalities.   DVT of right lower extremity  -new finding this admission -Continue with xarelto.   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  -Received 4 days of Levaquin.  -WBC stable.   CKD  stage II -renal fnx stable post cath  Physical deconditioning -PT recommend skilled nursing facility but patient not sure this is what he wants -Sister wants to take him home.   HYPERLIPIDEMIA  Hx of Essential hypertension, benign -BP soft - usual meds on hold  Hypothyroidism -cont Synthroid    DVT prophylaxis: Xarelto.  Code Status: Full Family Communication: sister 3-6.  Disposition Plan/Expected LOS: remain inpatient.   Consultants: Cardiology  Procedures: 2-D echocardiogram  - Procedure narrative: Transthoracic echocardiography. Image quality was adequate. The study was technically difficult, as a result of poor acoustic windows and poor sound wave transmission. Intravenous contrast (Definity) was administered. - Left ventricle: The cavity size was severely dilated. Wall thickness was normal. Systolic function was severely reduced. The estimated ejection fraction was in the range of 20% to 25%. Diffuse hypokinesis. Indeterminant diastolic function. - Aortic  valve: There was no stenosis. Trivial regurgitation. - Mitral valve: Mildly  calcified annulus. Mildly calcified leaflets . Moderate central regurgitation. - Left atrium: The atrium was moderately dilated. - Right ventricle: The cavity size was mildly to moderately dilated. Systolic function was mildly reduced. - Right atrium: The atrium was mildly dilated. - Tricuspid valve: Peak RV-RA gradient: 31m Hg (S). - Pulmonary arteries: PA peak pressure: 628mHg (S). - Systemic veins: IVC measured 2.2 cm with < 50% respirophasic variation, suggesting RA pressure 15 mmHg.  Ankle-brachial index  Left ABI suggest mild, borderline arterial insufficiency. Unable to obtain right lower extremity pressures due to acute DVT. Unable to evaluate the right brachial artery due to bandaging.  Lower extremity venous duplex  preliminary results consistent with right lower extremity DVT involving posterior tibial and peroneal veins  Antibiotics: Levaquin 2/22 >>>2/25  HPI/Subjective: Sitting in chair. Denies chest pain, dizziness, lightheadness.   Objective: Blood pressure 83/48, pulse 84, temperature 97.4 F (36.3 C), temperature source Oral, resp. rate 20, height 5' 9"  (1.753 m), weight 82.1 kg (181 lb), SpO2 96.00%.  Intake/Output Summary (Last 24 hours) at 12/11/13 1743 Last data filed at 12/11/13 1431  Gross per 24 hour  Intake    820 ml  Output   1825 ml  Net  -1005 ml   Exam: General: No acute respiratory distress Lungs: Clear to auscultation bilaterally without wheezes or crackles,  2 L Cardiovascular: Regular rate and rhythm without murmur gallop or rub normal S1 and S2,  Abdomen: Nontender, nondistended, soft, bowel sounds positive, no rebound, no ascites, no appreciable mass Musculoskeletal: No significant cyanosis, clubbing of bilateral lower extremities-dressing over R groin post cath   Scheduled Meds:  Scheduled Meds: . atorvastatin  40 mg Oral q1800  . clopidogrel  75 mg Oral Q breakfast  . digoxin  0.125 mg Oral Daily  . furosemide  60 mg  Intravenous BID  . levothyroxine  262 mcg Oral QAC breakfast  . Rivaroxaban  15 mg Oral BID WC  . spironolactone  12.5 mg Oral Daily   Continuous Infusions:    Data Reviewed: Basic Metabolic Panel:  Recent Labs Lab 12/07/13 1030 12/08/13 0435 12/08/13 1314 12/09/13 0455 12/09/13 1430 12/11/13 0600  NA 131* 132* 136* 133*  --  128*  K 4.5 4.0 4.4 3.9  --  4.1  CL 97 94* 98 94*  --  95*  CO2 22 23 25 25   --  21  GLUCOSE 101* 108* 93 80  --  96  BUN 29* 29* 29* 27*  --  24*  CREATININE 0.79 0.90 0.85 0.80  --  0.69  CALCIUM 8.8 9.0 9.0 8.9  --  8.4  MG  --   --   --   --  1.9  --    Liver Function Tests: No results found for this basename: AST, ALT, ALKPHOS, BILITOT, PROT, ALBUMIN,  in the last 168 hours  CBC:  Recent Labs Lab 12/07/13 0515 12/08/13 0435 12/08/13 1258 12/09/13 0455 12/11/13 0600  WBC 13.2* 15.0* 13.0* 12.8* 12.4*  HGB 11.8* 12.5* 12.8* 12.3* 12.1*  HCT 35.7* 36.8* 37.7* 36.4* 35.9*  MCV 87.1 86.0 85.9 86.9 84.9  PLT 253 270 267 264 295    No results found for this or any previous visit (from the past 240 hour(s)).   Studies:  Recent x-ray studies have been reviewed in detail by the Attending Physician    If 7PM-7AM, please contact night-coverage [email or web address] Password [email or web address]/06/2014, 5:43 PM  LOS: 17 days     I have examined the patient, reviewed the chart and modified the above note which I agree with.   Gwenyth Bouillon A Regalado,MD [phone].  Pager # [email or web address] 12/11/2013, 5:43 PM

## 2013-12-11 NOTE — Progress Notes (Addendum)
Physical Therapy Treatment Patient Details Name: James [name] MRN: [id number] DOB: 1935-05-07 Today's Date: 12/11/2013 Time: [id number] PT Time Calculation (min): 23 min  PT Assessment / Plan / Recommendation  History of Present Illness James [name] is a 79 y.o. male with a history of CAD, and ischemic Cardiomyopathy and Chronic systolic CHF who [facility] due to complaints of SOB, and worsening swelling of his lower legs  R>L over the past 2 days.  He denied having any Chest pain or fever or chills.   He was evaluated in the ED and was found to have a +Troponin level of 3.5, and changes consisted with CHF and possible pneumonia on chest X-ray per the EDP s/p cath 3/4. RLE DVT   PT Comments   Pt not progressing with mobility. The only activity he has been able to perform acutely is bed <>chair. Pt has not ambulated at all and is not yet safe to return home with assist of sister based on current presentation. Discussed DC plan with pt who demonstrates cognitive deficits and lack of awareness and deficits. Repeatedly asked pt how he would function at home and he said "I don't know". Pt educated for his significant activity limitations and recommendation for SNF. Attempted to contact sister via 2 number listed in chart and both with recording "this number is not receiving incoming calls". Will continue to attempt to work with pt and increase function and mobility for home. Recommend sister perform mobility in room with pt before determination to return home is official. If determined to return home would need ambulance transport for stairs into home, RW, HHPT, and BSC. Pt incontinent of urine in chair and asked if he was aware, he stated yes and that he didn't request assist because no one was in room despite stating he knew how to use call bell in his lap.   Follow Up Recommendations  SNF;Supervision/Assistance - 24 hour     Does the patient have the potential to tolerate intense  rehabilitation     Barriers to Discharge        Equipment Recommendations  Rolling walker with 5" wheels    Recommendations for Other Services    Frequency     Progress towards PT Goals Progress towards PT goals: Not progressing toward goals - comment  Plan Current plan remains appropriate    Precautions / Restrictions Precautions Precautions: Fall   Pertinent Vitals/Pain Right foot pain - pt stating 2/10 sats 99% on RA HR 80   Mobility  Bed Mobility Overal bed mobility: Needs Assistance Bed Mobility: Sit to Supine Sit to supine: Supervision General bed mobility comments: cues for sequence and initiation Transfers Overall transfer level: Needs assistance Equipment used: Rolling walker (2 wheeled) Sit to Stand: Min assist Stand pivot transfers: Mod assist General transfer comment: pt repeatedly stating in sitting he can't stand "I just can't make it" , even with assist pt would not attempt to stand. Pt finally agreeable to assist with standing when noted all linens under pt soaked with urine and need to stand for pericare. Pt able to stand with min assist but then grasped RW and maintained flexed posture and needed mod assist to pivot the 4' chair to bed with max cueing.     Exercises General Exercises - Lower Extremity Long Arc Quad: AROM;Seated;Both;15 reps Hip Flexion/Marching: AROM;Seated;Both;10 reps   PT Diagnosis:    PT Problem List:   PT Treatment Interventions:     PT Goals (  current goals can now be found in the care plan section)    Visit Information  Last PT Received On: 12/11/13 Assistance Needed: +1 History of Present Illness: James [name] is a 79 y.o. male with a history of CAD, and ischemic Cardiomyopathy and Chronic systolic CHF who was seen at the [facility] ED due to complaints of SOB, and worsening swelling of his lower legs  R>L over the past 2 days.  He denied having any Chest pain or fever or chills.   He was evaluated in the ED and was found to  have a +Troponin level of 3.5, and changes consisted with CHF and possible pneumonia on chest X-ray per the EDP s/p cath 3/4. RLE DVT    Subjective Data      Cognition  Cognition Arousal/Alertness: Awake/alert Behavior During Therapy: Flat affect Overall Cognitive Status: Impaired/Different from baseline Area of Impairment: Memory;Safety/judgement;Problem solving;Orientation Orientation Level: Time Memory: Decreased short-term memory Safety/Judgement: Decreased awareness of deficits;Decreased awareness of safety Problem Solving: Slow processing;Difficulty sequencing;Requires verbal cues;Requires tactile cues;Decreased initiation    Balance     End of Session PT - End of Session Equipment Utilized During Treatment: Gait belt Activity Tolerance: Patient limited by fatigue Patient left: in bed;with call bell/phone within reach Nurse Communication: Mobility status   GP     Delorse Lek 12/11/2013, 12:53 PM Delaney Meigs, PT [phone]

## 2013-12-11 NOTE — Progress Notes (Signed)
Patient ID: James [name], male   DOB: April 20, 1935, 79 y.o.   MRN: [id number]    SUBJECTIVE: Very short of breath after walking with PT.  Did not get Lasix yesterday with low BP.     Scheduled Meds: . atorvastatin  40 mg Oral q1800  . clopidogrel  75 mg Oral Q breakfast  . digoxin  0.125 mg Oral Daily  . furosemide  60 mg Intravenous BID  . levothyroxine  262 mcg Oral QAC breakfast  . Rivaroxaban  15 mg Oral BID WC  . spironolactone  12.5 mg Oral Daily   Continuous Infusions:   PRN Meds:.acetaminophen, ondansetron (ZOFRAN) IV, sodium chloride    Filed Vitals:   12/10/13 2125 12/11/13 0618 12/11/13 1019 12/11/13 1022  BP: 97/54 96/58 83/54    Pulse: 84 80 62 88  Temp: 98.1 F (36.7 C) 97.5 F (36.4 C)    TempSrc: Oral Oral    Resp: 18 18 20    Height:      Weight:  82.1 kg (181 lb)    SpO2: 95% 97% 99%     Intake/Output Summary (Last 24 hours) at 12/11/13 1202 Last data filed at 12/11/13 1201  Gross per 24 hour  Intake    580 ml  Output   1500 ml  Net   -920 ml    LABS: Basic Metabolic Panel:  Recent Labs  57/84/6902/04/18 0455 12/09/13 1430 12/11/13 0600  NA 133*  --  128*  K 3.9  --  4.1  CL 94*  --  95*  CO2 25  --  21  GLUCOSE 80  --  96  BUN 27*  --  24*  CREATININE 0.80  --  0.69  CALCIUM 8.9  --  8.4  MG  --  1.9  --    Liver Function Tests: No results found for this basename: AST, ALT, ALKPHOS, BILITOT, PROT, ALBUMIN,  in the last 72 hours No results found for this basename: LIPASE, AMYLASE,  in the last 72 hours CBC:  Recent Labs  12/09/13 0455 12/11/13 0600  WBC 12.8* 12.4*  HGB 12.3* 12.1*  HCT 36.4* 35.9*  MCV 86.9 84.9  PLT 264 295   Cardiac Enzymes:  Recent Labs  12/08/13 1400  TROPONINI <0.30   BNP: No components found with this basename: POCBNP,  D-Dimer: No results found for this basename: DDIMER,  in the last 72 hours Hemoglobin A1C: No results found for this basename: HGBA1C,  in the last 72 hours Fasting Lipid  Panel: No results found for this basename: CHOL, HDL, LDLCALC, TRIG, CHOLHDL, LDLDIRECT,  in the last 72 hours Thyroid Function Tests: No results found for this basename: TSH, T4TOTAL, FREET3, T3FREE, THYROIDAB,  in the last 72 hours Anemia Panel: No results found for this basename: VITAMINB12, FOLATE, FERRITIN, TIBC, IRON, RETICCTPCT,  in the last 72 hours  RADIOLOGY: Dg Chest Port 1 View  11/26/2013   CLINICAL DATA:  Chest pain and congestive heart failure.  EXAM: PORTABLE CHEST - 1 VIEW  COMPARISON:  11/25/2013  FINDINGS: Previous median sternotomy and CABG procedure. The heart size is moderately enlarged. There is persistent airspace consolidation within the left upper lobe. Mild diffuse edema is identified elsewhere. Calcification within the left ventricular apex is noted which may be the sequelae of prior infarct  IMPRESSION: 1. Left upper lobe pneumonia. 2. Superimposed pulmonary edema compatible with CHF.   Electronically Signed   By: Signa Kellaylor  Stroud M.D.   On: 11/26/2013 10:55   Dg  Chest Port 1 View  11/25/2013   CLINICAL DATA:  Congestive heart failure. Hypotension. Coronary artery disease.  EXAM: PORTABLE CHEST - 1 VIEW  COMPARISON:  11/24/13  FINDINGS: Left upper lobe airspace disease shows no significant interval change. Cardiomegaly and pulmonary vascular congestion are stable. No evidence of pleural effusion. Prior CABG again noted.  IMPRESSION: No significant change in left upper lobe airspace disease, suspicious for pneumonia.   Electronically Signed   By: Myles Rosenthal M.D.   On: 11/25/2013 20:14    PHYSICAL EXAM General: NAD Neck: JVP 8-9 cm, no thyromegaly or thyroid nodule.  Lungs: Clear to auscultation bilaterally with normal respiratory effort. CV: Nondisplaced PMI.  Heart regular S1/S2, no S3/S4, 1/6 HSM apex.  1+ edema to knee right leg.  No carotid bruit.   Abdomen: Soft, nontender, no hepatosplenomegaly, no distention.  Neurologic: Alert and oriented x 3.  Psych: Normal  affect. Extremities: No clubbing or cyanosis.   TELEMETRY: Reviewed telemetry pt in NSR   ASSESSMENT AND PLAN: 79 yo with history of CAD s/p CABG and chronic systolic CHF was admitted with NSTEMI.  He also has acute on chronic systolic CHF with low output and was found to have a RLE DVT.   1. CAD: NSTEMI with complex disease in SVG-OM, now s/p DES x 2 to SVG-OM. - Continue Plavix and statin.  Can stop ASA as long as he is on Xarelto.  Continue Plavix long-term. 2. Acute on chronic systolic CHF:  Ischemic cardiomyopathy.  Echo with EF 20-25%, diffuse hypokinesis, mild to moderate RV dysfunction, moderate pulmonary hypertension, moderate MR.  Was on milrinone for a couple of days.  BP still on the low side and dyspneic with ambulation.  - Continue digoxin and spironolactone.  - BP too low for ACEI.  - Would continue Lasix 60 mg IV bid today.  I think he is still a little volume overloaded and has been short of breath. - Check co-ox today: he may end up needing to go back on milrinone if looks like low output still.  3. RLE DVT: Xarelto x 6 months.  Low risk V/Q scan.  4. Encephalopathy: Improved.  5. Needs ongoing PT, needs SNF placement.  Does not look like he could manage at home.   Marca Ancona 12/11/2013 12:02 PM

## 2013-12-12 LAB — BASIC METABOLIC PANEL
BUN: 22 mg/dL (ref 6–23)
CALCIUM: 8.7 mg/dL (ref 8.4–10.5)
CO2: 24 mEq/L (ref 19–32)
Chloride: 91 mEq/L — ABNORMAL LOW (ref 96–112)
Creatinine, Ser: 0.82 mg/dL (ref 0.50–1.35)
GFR calc Af Amer: >90 mL/min (ref >90)
GFR, EST NON AFRICAN AMERICAN: 83 mL/min — AB (ref >90)
GLUCOSE: 95 mg/dL (ref 70–99)
POTASSIUM: 4.2 meq/L (ref 3.7–5.3)
SODIUM: 128 meq/L — AB (ref 137–147)

## 2013-12-12 LAB — CBC
HCT: 37.5 % — ABNORMAL LOW (ref 39.0–52.0)
HEMOGLOBIN: 12.8 g/dL — AB (ref 13.0–17.0)
MCH: 29 pg (ref 26.0–34.0)
MCHC: 34.1 g/dL (ref 30.0–36.0)
MCV: 84.8 fL (ref 78.0–100.0)
PLATELETS: 296 10*3/uL (ref 150–400)
RBC: 4.42 MIL/uL (ref 4.22–5.81)
RDW: 13.5 % (ref 11.5–15.5)
WBC: 12.9 10*3/uL — AB (ref 4.0–10.5)

## 2013-12-12 MED ORDER — ENSURE COMPLETE PO LIQD
237.0000 mL | ORAL | Status: DC
  Administered 2013-12-12 – 2013-12-13 (×2): 237 mL via ORAL

## 2013-12-12 NOTE — Progress Notes (Signed)
Report has been given to receiving night shift RN.  RN denies any questions or concerns at this time.  Pt resting comfortably in recliner and appears in no acute distress. Nino Glowourtney Rily Nickey RN

## 2013-12-12 NOTE — Progress Notes (Signed)
Pt had a 7 beat run of Vtach on the monitor.  Pt asymptomatic at the time resting comfortably in bed.  Pt denies any chest pain, SOB, lightheadedness, or dizziness.  MD has been made aware. Nino Glowourtney Aleister Lady RN

## 2013-12-12 NOTE — Progress Notes (Signed)
Progress Note  James [name]:[id number] DOB: [birth year] DOA: 11/24/2013 PCP: [name], DO  Brief narrative: 79 year old male with history of CAD status post CABG as well as congestive heart failure, grade 1 diastolic and systolic CHF with a decreased ejection fraction of 40% who was transferred from [facility] emergency room after presenting there on 2/20 with complaints of shortness of breath and lower extremity swelling x2 days. He was found to have a troponin of 3.5 and chest x-ray noted pneumonia and congestive heart failure so patient was transferred to the hospitalist service and placed in the step down unit. Patient's cardiologist is Dr. Percival Spanish of [facility] heart care and was consulted.   Attempts at diuresing were met with hypotension;  The patient's blood pressure dropped as low as 50s. Critical care was consulted initially for possible dopamine, however patient did respond to gentle fluid boluses. As troponins were trending down, pneumonia treated, and blood pressure stabilized, patient's mentation improved.   Assessment/Plan:  1-Acute respiratory failure with hypoxia :   A) Ischemic CM / Acute on chronic systolic heart failure EF 20 %   B) Left upper lobe pneumonia v/s pulmonary infarct  -diuresis has been limited by hypotension. -On IV lasix 60 mg IV BID.  -V-Q scan low probability for PE. Anticoagulation for 6 months.  -Antibiotics discontinued. Received 4 days of  Levaquin. WBC trending down. Chest x-ray infiltrates improving.  -Milrinone added by Cardiology but after weaned pt developed hypotension -did not tolerate when resumed 2/2 arrhythmia so Dopamine used to keep SBP>80-85-now being titrated down -Management of heart failure and ischemia per Cardiology team-  Hyponatremia; suspect secondary to HF.   Hypotension; BP better today in the 100 range.  -recurrent not sepsis (see above) -Continue to hold ACE.  -restarted on spironolactone 3-09.  -Repeat lactic  acid norma.  -I suspect cardiogenic in origin.  -cortisol level 24 normal.    2-Weakness; Patient not feeling well on 3-6, low energy. Denies chest pain, dyspnea, abdominal pain, dizziness, no pain. He falls sleep in between conversation. Per family at staff this is baseline.  -ABG no hypercapnia,  - Resolved.   Acute delirium -resolving. - CT head. No acute intracranial abnormalities.   DVT of right lower extremity  -new finding this admission -Continue with xarelto.   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  -Received 4 days of Levaquin.  -WBC stable.   CKD  stage II -renal fnx stable post cath  Physical deconditioning -PT recommend skilled nursing facility but patient not sure this is what he wants -Sister wants to take him home.   HYPERLIPIDEMIA  Hx of Essential hypertension, benign -BP soft - usual meds on hold  Hypothyroidism -cont Synthroid    DVT prophylaxis: Xarelto.  Code Status: Full Family Communication: sister 3-6.  Disposition Plan/Expected LOS: SNF when ok by cardio.   Consultants: Cardiology  Procedures: 2-D echocardiogram  - Procedure narrative: Transthoracic echocardiography. Image quality was adequate. The study was technically difficult, as a result of poor acoustic windows and poor sound wave transmission. Intravenous contrast (Definity) was administered. - Left ventricle: The cavity size was severely dilated. Wall thickness was normal. Systolic function was severely reduced. The estimated ejection fraction was in the range  of 20% to 25%. Diffuse hypokinesis. Indeterminant diastolic function. - Aortic valve: There was no stenosis.  Trivial regurgitation. - Mitral valve: Mildly calcified annulus. Mildly calcified leaflets . Moderate central regurgitation. - Left atrium: The atrium was moderately dilated. - Right ventricle: The cavity size was mildly to moderately dilated. Systolic function was mildly reduced. - Right atrium: The atrium was mildly dilated. - Tricuspid valve: Peak RV-RA gradient: 17m Hg (S). - Pulmonary arteries: PA peak pressure: 62mHg (S). - Systemic veins: IVC measured 2.2 cm with < 50% respirophasic variation, suggesting RA pressure 15 mmHg.  Ankle-brachial index  Left ABI suggest mild, borderline arterial insufficiency. Unable to obtain right lower extremity pressures due to acute DVT. Unable to evaluate the right brachial artery due to bandaging.  Lower extremity venous duplex  preliminary results consistent with right lower extremity DVT involving posterior tibial and peroneal veins  Antibiotics: Levaquin 2/22 >>>2/25  HPI/Subjective: Denies chest pain, dizziness, lightheadness.  Agree to go to SNF.   Objective: Blood pressure 100/55, pulse 94, temperature 98.5 F (36.9 C), temperature source Oral, resp. rate 18, height 5' 9"  (1.753 m), weight 78.5 kg (173 lb 1 oz), SpO2 100.00%.  Intake/Output Summary (Last 24 hours) at 12/12/13 1323 Last data filed at 12/12/13 1100  Gross per 24 hour  Intake    440 ml  Output   2950 ml  Net  -2510 ml   Exam: General: No acute respiratory distress Lungs: Clear to auscultation bilaterally without wheezes or crackles,  2 L Cardiovascular: Regular rate and rhythm without murmur gallop or rub normal S1 and S2,  Abdomen: Nontender, nondistended, soft, bowel sounds positive, no rebound, no ascites, no appreciable mass Musculoskeletal: No significant cyanosis, clubbing of bilateral lower extremities-dressing over R groin post cath   Scheduled Meds:  Scheduled Meds: . atorvastatin  40 mg Oral q1800  . clopidogrel  75 mg Oral Q breakfast  .  digoxin  0.125 mg Oral Daily  . feeding supplement (ENSURE COMPLETE)  237 mL Oral Q24H  . furosemide  60 mg Intravenous BID  . levothyroxine  262 mcg Oral QAC breakfast  . Rivaroxaban  15 mg Oral BID WC  . spironolactone  12.5 mg Oral Daily   Continuous Infusions:    Data Reviewed: Basic Metabolic Panel:  Recent Labs Lab 12/08/13 0435 12/08/13 1314 12/09/13 0455 12/09/13 1430 12/11/13 0600 12/12/13 0650  NA 132* 136* 133*  --  128* 128*  K 4.0 4.4 3.9  --  4.1 4.2  CL 94* 98 94*  --  95* 91*  CO2 23 25 25   --  21 24  GLUCOSE 108* 93 80  --  96 95  BUN 29* 29* 27*  --  24* 22  CREATININE 0.90 0.85 0.80  --  0.69 0.82  CALCIUM 9.0 9.0 8.9  --  8.4 8.7  MG  --   --   --  1.9  --   --    Liver Function Tests: No results found for this basename: AST, ALT, ALKPHOS, BILITOT, PROT, ALBUMIN,  in the last 168 hours  CBC:  Recent Labs Lab 12/08/13 0435 12/08/13 1258 12/09/13 0455 12/11/13 0600 12/12/13 0650  WBC 15.0* 13.0* 12.8* 12.4* 12.9*  HGB 12.5* 12.8* 12.3* 12.1* 12.8*  HCT 36.8* 37.7* 36.4* 35.9* 37.5*  MCV 86.0 85.9 86.9 84.9 84.8  PLT 270 267 264 295 296    No results found for this or any previous visit (from the past 240 hour(s)).   Studies:  Recent x-ray studies have been reviewed in detail by the Attending Physician    If 7PM-7AM, please contact night-coverage [email or web address] Password [email or web address] 12/12/2013, 1:23 PM   LOS: 18 days     I have examined the patient, reviewed the chart and modified the above note which I agree with.   Gwenyth Bouillon A Wassim Kirksey,MD [phone].  Pager # [phone] [email or web address] 12/12/2013, 1:23 PM

## 2013-12-12 NOTE — Progress Notes (Signed)
Patient ID: James [name], male   DOB: [birth year], 79 y.o.   MRN: [id number]    SUBJECTIVE: No dyspnea at rest.  Diuresed well yesterday.   Scheduled Meds: . atorvastatin  40 mg Oral q1800  . clopidogrel  75 mg Oral Q breakfast  . digoxin  0.125 mg Oral Daily  . furosemide  60 mg Intravenous BID  . levothyroxine  262 mcg Oral QAC breakfast  . Rivaroxaban  15 mg Oral BID WC  . spironolactone  12.5 mg Oral Daily   Continuous Infusions:   PRN Meds:.acetaminophen, ondansetron (ZOFRAN) IV, sodium chloride    Filed Vitals:   12/11/13 1259 12/11/13 1700 12/11/13 2030 12/12/13 0548  BP: 83/48 98/50 98/64  101/50  Pulse: 84 63 58 76  Temp: 97.4 F (36.3 C)  98 F (36.7 C) 98.5 F (36.9 C)  TempSrc:   Oral Oral  Resp: 20  20 18   Height:      Weight:    173 lb 1 oz (78.5 kg)  SpO2: 96%  97% 93%    Intake/Output Summary (Last 24 hours) at 12/12/13 0919 Last data filed at 12/12/13 0543  Gross per 24 hour  Intake    360 ml  Output   3125 ml  Net  -2765 ml    LABS: Basic Metabolic Panel:  Recent Labs  40/98/11 1430 12/11/13 0600 12/12/13 0650  NA  --  128* 128*  K  --  4.1 4.2  CL  --  95* 91*  CO2  --  21 24  GLUCOSE  --  96 95  BUN  --  24* 22  CREATININE  --  0.69 0.82  CALCIUM  --  8.4 8.7  MG 1.9  --   --    Liver Function Tests: No results found for this basename: AST, ALT, ALKPHOS, BILITOT, PROT, ALBUMIN,  in the last 72 hours No results found for this basename: LIPASE, AMYLASE,  in the last 72 hours CBC:  Recent Labs  12/11/13 0600 12/12/13 0650  WBC 12.4* 12.9*  HGB 12.1* 12.8*  HCT 35.9* 37.5*  MCV 84.9 84.8  PLT 295 296   Cardiac Enzymes: No results found for this basename: CKTOTAL, CKMB, CKMBINDEX, TROPONINI,  in the last 72 hours BNP: No components found with this basename: POCBNP,  D-Dimer: No results found for this basename: DDIMER,  in the last 72 hours Hemoglobin A1C: No results found for this basename: HGBA1C,  in the last 72  hours Fasting Lipid Panel: No results found for this basename: CHOL, HDL, LDLCALC, TRIG, CHOLHDL, LDLDIRECT,  in the last 72 hours Thyroid Function Tests: No results found for this basename: TSH, T4TOTAL, FREET3, T3FREE, THYROIDAB,  in the last 72 hours Anemia Panel: No results found for this basename: VITAMINB12, FOLATE, FERRITIN, TIBC, IRON, RETICCTPCT,  in the last 72 hours  RADIOLOGY: Dg Chest Port 1 View  11/26/2013   CLINICAL DATA:  Chest pain and congestive heart failure.  EXAM: PORTABLE CHEST - 1 VIEW  COMPARISON:  11/25/2013  FINDINGS: Previous median sternotomy and CABG procedure. The heart size is moderately enlarged. There is persistent airspace consolidation within the left upper lobe. Mild diffuse edema is identified elsewhere. Calcification within the left ventricular apex is noted which may be the sequelae of prior infarct  IMPRESSION: 1. Left upper lobe pneumonia. 2. Superimposed pulmonary edema compatible with CHF.   Electronically Signed   By: Signa Kell M.D.   On: 11/26/2013 10:55   Dg Chest [facility]  1 View  11/25/2013   CLINICAL DATA:  Congestive heart failure. Hypotension. Coronary artery disease.  EXAM: PORTABLE CHEST - 1 VIEW  COMPARISON:  11/24/13  FINDINGS: Left upper lobe airspace disease shows no significant interval change. Cardiomegaly and pulmonary vascular congestion are stable. No evidence of pleural effusion. Prior CABG again noted.  IMPRESSION: No significant change in left upper lobe airspace disease, suspicious for pneumonia.   Electronically Signed   By: [name]  Stahl M.D.   On: 11/25/2013 20:14    PHYSICAL EXAM General: NAD Neck: JVP 8 cm, no thyromegaly or thyroid nodule.  Lungs: Clear to auscultation bilaterally with normal respiratory effort. CV: Nondisplaced PMI.  Heart regular S1/S2, no S3/S4, 1/6 HSM apex.  1+ edema to knee right leg.  No carotid bruit.   Abdomen: Soft, nontender, no hepatosplenomegaly, no distention.  Neurologic: Alert and oriented x  3.  Psych: Normal affect. Extremities: No clubbing or cyanosis.   TELEMETRY: Reviewed telemetry pt in NSR   ASSESSMENT AND PLAN: 79 yo with history of CAD s/p CABG and chronic systolic CHF was admitted with NSTEMI.  He also has acute on chronic systolic CHF with low output and was found to have a RLE DVT.   1. CAD: NSTEMI with complex disease in SVG-OM, now s/p DES x 2 to SVG-OM. - Continue Plavix and statin.  Can stop ASA as long as he is on Xarelto.  Continue Plavix long-term. 2. Acute on chronic systolic CHF:  Ischemic cardiomyopathy.  Echo with EF 20-25%, diffuse hypokinesis, mild to moderate RV dysfunction, moderate pulmonary hypertension, moderate MR.  Was on milrinone for a couple of days.  BP still on the low side and dyspneic with ambulation.  - Continue digoxin and spironolactone.  - BP too low for ACEI.  - Continue IV Lasix for one more day.  If he diureses well again today, likely to po tomorrow. - Cannot check co-ox now b/c patient pulled out his PICC.  Would like to avoid long-term milrinone if possible, I think that a continuous infusion would be very difficult for him.   3. RLE DVT: Xarelto x 6 months.  Low risk V/Q scan.  4. Encephalopathy: Improved.  5. Needs ongoing PT, needs SNF placement.  Does not look like he could manage at home.   Marca AnconaDalton Aneliz Carbary 12/12/2013 9:19 AM

## 2013-12-12 NOTE — Progress Notes (Signed)
NUTRITION FOLLOW UP  Intervention:   1.  General healthful diet; encourage intake of foods and beverages as able.   2. Provide Ensure Complete once daily  Nutrition Dx:   Inadequate oral intake, improving.   Monitor:   1.  Food/Beverage; pt meeting >/=90% estimated needs with tolerance.  Likely met with adequate intake. 2.  Wt/wt change; monitor trends. Overall stable  Assessment:   Patient with PMH of CAD, ischemic cardiomyopathy and chronic systolic CHF who was seen at [facility] ED due to complaints of SOB, and worsening swelling of his lower legs. He denied having any chest pain or fever or chills. He was evaluated in the ED and was found to have a +Troponin level of 3.5, and changes consisted with CHF and possible pneumonia on chest X-ray.  Per nurse tech pt did not want to eat dinner last night and ate very little at breakfast. Pt's weight has dropped 18 lbs since admission. Pt reports that he usually weighs 175 lbs.  Pt describes his appetite as fair and states he ate more than 50% of his breakfast. Per nursing notes meal completion has varied 0% to 100% for the past week. Discussed weight loss with pt. Encouraged PO intake. Pt agreeable to trying Ensure supplements to prevent additional weight loss.   Height: Ht Readings from Last 1 Encounters:  12/10/13 5' 9"  (1.753 m)    Weight Status:   Wt Readings from Last 1 Encounters:  12/12/13 173 lb 1 oz (78.5 kg)    Re-estimated needs:  Kcal: 1950-2150  Protein: 95-105 gm  Fluid: per MD  Skin: intact; +2 generalized edema  Diet Order: Cardiac   Intake/Output Summary (Last 24 hours) at 12/12/13 1133 Last data filed at 12/12/13 0543  Gross per 24 hour  Intake    360 ml  Output   3125 ml  Net  -2765 ml   Last BM: 3/8  Labs:   Recent Labs Lab 12/09/13 0455 12/09/13 1430 12/11/13 0600 12/12/13 0650  NA 133*  --  128* 128*  K 3.9  --  4.1 4.2  CL 94*  --  95* 91*  CO2 25  --  21 24  BUN 27*  --  24* 22   CREATININE 0.80  --  0.69 0.82  CALCIUM 8.9  --  8.4 8.7  MG  --  1.9  --   --   GLUCOSE 80  --  96 95    CBG (last 3)  No results found for this basename: GLUCAP,  in the last 72 hours  Scheduled Meds: . atorvastatin  40 mg Oral q1800  . clopidogrel  75 mg Oral Q breakfast  . digoxin  0.125 mg Oral Daily  . furosemide  60 mg Intravenous BID  . levothyroxine  262 mcg Oral QAC breakfast  . Rivaroxaban  15 mg Oral BID WC  . spironolactone  12.5 mg Oral Daily    Continuous Infusions:   Pryor Ochoa RD, LDN Inpatient Clinical Dietitian Pager: [phone] After Hours Pager: [phone]

## 2013-12-13 DIAGNOSIS — I10 Essential (primary) hypertension: Secondary | ICD-10-CM

## 2013-12-13 LAB — CBC
HEMATOCRIT: 38.6 % — AB (ref 39.0–52.0)
HEMOGLOBIN: 13 g/dL (ref 13.0–17.0)
MCH: 28.5 pg (ref 26.0–34.0)
MCHC: 33.7 g/dL (ref 30.0–36.0)
MCV: 84.6 fL (ref 78.0–100.0)
PLATELETS: 316 10*3/uL (ref 150–400)
RBC: 4.56 MIL/uL (ref 4.22–5.81)
RDW: 13.6 % (ref 11.5–15.5)
WBC: 12.5 10*3/uL — ABNORMAL HIGH (ref 4.0–10.5)

## 2013-12-13 LAB — BASIC METABOLIC PANEL
BUN: 27 mg/dL — ABNORMAL HIGH (ref 6–23)
CALCIUM: 8.7 mg/dL (ref 8.4–10.5)
CHLORIDE: 89 meq/L — AB (ref 96–112)
CO2: 24 meq/L (ref 19–32)
Creatinine, Ser: 0.73 mg/dL (ref 0.50–1.35)
GFR calc Af Amer: >90 mL/min (ref >90)
GFR calc non Af Amer: 87 mL/min — ABNORMAL LOW (ref >90)
GLUCOSE: 102 mg/dL — AB (ref 70–99)
POTASSIUM: 3.8 meq/L (ref 3.7–5.3)
Sodium: 127 mEq/L — ABNORMAL LOW (ref 137–147)

## 2013-12-13 MED ORDER — FUROSEMIDE 40 MG PO TABS
60.0000 mg | ORAL_TABLET | Freq: Two times a day (BID) | ORAL | Status: DC
  Administered 2013-12-13 – 2013-12-14 (×3): 60 mg via ORAL
  Filled 2013-12-13 (×6): qty 1

## 2013-12-13 MED ORDER — LISINOPRIL 2.5 MG PO TABS: 2.5000 mg | ORAL_TABLET | Freq: Every day | ORAL | Status: AC

## 2013-12-13 MED ORDER — LISINOPRIL 2.5 MG PO TABS
2.5000 mg | ORAL_TABLET | Freq: Every day | ORAL | Status: DC
  Filled 2013-12-13 (×2): qty 1

## 2013-12-13 MED ORDER — SPIRONOLACTONE 12.5 MG HALF TABLET: 12.5000 mg | ORAL_TABLET | Freq: Every day | ORAL | Status: AC

## 2013-12-13 MED ORDER — RIVAROXABAN 15 MG PO TABS: 15.0000 mg | ORAL_TABLET | Freq: Two times a day (BID) | ORAL | Status: AC

## 2013-12-13 MED ORDER — RIVAROXABAN 20 MG PO TABS: 20.0000 mg | ORAL_TABLET | Freq: Every day | ORAL | Status: AC

## 2013-12-13 MED ORDER — DIGOXIN 125 MCG PO TABS: 0.1250 mg | ORAL_TABLET | Freq: Every day | ORAL | Status: AC

## 2013-12-13 MED ORDER — POTASSIUM CHLORIDE CRYS ER 20 MEQ PO TBCR
20.0000 meq | EXTENDED_RELEASE_TABLET | Freq: Once | ORAL | Status: AC
  Administered 2013-12-13: 20 meq via ORAL
  Filled 2013-12-13: qty 1

## 2013-12-13 MED ORDER — TRAMADOL HCL 50 MG PO TABS: 50.0000 mg | ORAL_TABLET | Freq: Four times a day (QID) | ORAL | Status: AC | PRN

## 2013-12-13 MED ORDER — FUROSEMIDE 40 MG PO TABS: 60.0000 mg | ORAL_TABLET | Freq: Two times a day (BID) | ORAL | Status: AC

## 2013-12-13 MED ORDER — ENSURE COMPLETE PO LIQD: 237.0000 mL | Freq: Two times a day (BID) | ORAL | Status: AC

## 2013-12-13 MED ORDER — ATORVASTATIN CALCIUM 40 MG PO TABS: 40.0000 mg | ORAL_TABLET | Freq: Every day | ORAL | Status: AC

## 2013-12-13 NOTE — Progress Notes (Signed)
CSW spoke to patient's sister and provided SNF bed offers. She has [facility] [place] [place]. CSW spoke to [place] [facility]- they have an available bed- however- she has submitted for insurance authorization which much be recieved before patient can be admitted. She does not believe the auth will be forthcoming until tomorrow but will watch throughout the day.  Per Dr. Gwenlyn PerkingMadera- patient can be d/c'd when arrangements are completed.  Patient' sister will go to facility to sign admission papers this afternoon. Patient is agreeable to plan as he defers all to his sister. He is hopeful to return home as soon as rehab is completed.    Lorri Frederickonna T. West PughCrowder, LCSWA  [phone]

## 2013-12-13 NOTE — Progress Notes (Signed)
Patient was just cleaned up by nurse tech. Patient pulled up and repostioned in the bed and is laying supine. Medications given with water. Patient complains of no pain or discomfort at this time. Will continue to monitor patient to end of shift.

## 2013-12-13 NOTE — Progress Notes (Signed)
Patient ID: James [name], male   DOB: July 19, 1935, 79 y.o.   MRN: [id number]   SUBJECTIVE: Stable, did some walking yesterday.  No complaints this morning.  Still weak.    Scheduled Meds: . atorvastatin  40 mg Oral q1800  . clopidogrel  75 mg Oral Q breakfast  . digoxin  0.125 mg Oral Daily  . feeding supplement (ENSURE COMPLETE)  237 mL Oral Q24H  . furosemide  60 mg Oral BID  . levothyroxine  262 mcg Oral QAC breakfast  . lisinopril  2.5 mg Oral Daily  . Rivaroxaban  15 mg Oral BID WC  . spironolactone  12.5 mg Oral Daily   Continuous Infusions:   PRN Meds:.acetaminophen, ondansetron (ZOFRAN) IV, sodium chloride    Filed Vitals:   12/12/13 1353 12/12/13 1723 12/12/13 1955 12/13/13 0502  BP: 90/54 105/68 99/64 103/58  Pulse: 84 86 77 71  Temp: 98 F (36.7 C)  99.1 F (37.3 C) 98 F (36.7 C)  TempSrc: Oral  Oral Oral  Resp: 18  18 18   Height:      Weight:    77.86 kg (171 lb 10.4 oz)  SpO2: 100%  100% 96%    Intake/Output Summary (Last 24 hours) at 12/13/13 0730 Last data filed at 12/13/13 0550  Gross per 24 hour  Intake    900 ml  Output   1650 ml  Net   -750 ml    LABS: Basic Metabolic Panel:  Recent Labs  40/98/11 0650 12/13/13 0541  NA 128* 127*  K 4.2 3.8  CL 91* 89*  CO2 24 24  GLUCOSE 95 102*  BUN 22 27*  CREATININE 0.82 0.73  CALCIUM 8.7 8.7   Liver Function Tests: No results found for this basename: AST, ALT, ALKPHOS, BILITOT, PROT, ALBUMIN,  in the last 72 hours No results found for this basename: LIPASE, AMYLASE,  in the last 72 hours CBC:  Recent Labs  12/12/13 0650 12/13/13 0541  WBC 12.9* 12.5*  HGB 12.8* 13.0  HCT 37.5* 38.6*  MCV 84.8 84.6  PLT 296 316   Cardiac Enzymes: No results found for this basename: CKTOTAL, CKMB, CKMBINDEX, TROPONINI,  in the last 72 hours BNP: No components found with this basename: POCBNP,  D-Dimer: No results found for this basename: DDIMER,  in the last 72 hours Hemoglobin A1C: No results  found for this basename: HGBA1C,  in the last 72 hours Fasting Lipid Panel: No results found for this basename: CHOL, HDL, LDLCALC, TRIG, CHOLHDL, LDLDIRECT,  in the last 72 hours Thyroid Function Tests: No results found for this basename: TSH, T4TOTAL, FREET3, T3FREE, THYROIDAB,  in the last 72 hours Anemia Panel: No results found for this basename: VITAMINB12, FOLATE, FERRITIN, TIBC, IRON, RETICCTPCT,  in the last 72 hours  RADIOLOGY: Dg Chest Port 1 View  11/26/2013   CLINICAL DATA:  Chest pain and congestive heart failure.  EXAM: PORTABLE CHEST - 1 VIEW  COMPARISON:  11/25/2013  FINDINGS: Previous median sternotomy and CABG procedure. The heart size is moderately enlarged. There is persistent airspace consolidation within the left upper lobe. Mild diffuse edema is identified elsewhere. Calcification within the left ventricular apex is noted which may be the sequelae of prior infarct  IMPRESSION: 1. Left upper lobe pneumonia. 2. Superimposed pulmonary edema compatible with CHF.   Electronically Signed   By: Signa Kell M.D.   On: 11/26/2013 10:55   Dg Chest Port 1 View  11/25/2013   CLINICAL DATA:  Congestive  heart failure. Hypotension. Coronary artery disease.  EXAM: PORTABLE CHEST - 1 VIEW  COMPARISON:  11/24/13  FINDINGS: Left upper lobe airspace disease shows no significant interval change. Cardiomegaly and pulmonary vascular congestion are stable. No evidence of pleural effusion. Prior CABG again noted.  IMPRESSION: No significant change in left upper lobe airspace disease, suspicious for pneumonia.   Electronically Signed   By: [name]  Stahl M.D.   On: 11/25/2013 20:14    PHYSICAL EXAM General: NAD Neck: JVP 8 cm, no thyromegaly or thyroid nodule.  Lungs: Clear to auscultation bilaterally with normal respiratory effort. CV: Nondisplaced PMI.  Heart regular S1/S2, no S3/S4, 1/6 HSM apex.  1+ edema right ankle.  No carotid bruit.   Abdomen: Soft, nontender, no hepatosplenomegaly, no  distention.  Neurologic: Alert and oriented x 3.  Psych: Normal affect. Extremities: No clubbing or cyanosis.   TELEMETRY: Reviewed telemetry pt in NSR   ASSESSMENT AND PLAN: 79 yo with history of CAD s/p CABG and chronic systolic CHF was admitted with NSTEMI.  He also has acute on chronic systolic CHF with low output and was found to have a RLE DVT.   1. CAD: NSTEMI with complex disease in SVG-OM, now s/p DES x 2 to SVG-OM. - Continue Plavix and statin.  Can stop ASA as long as he is on Xarelto.  Continue Plavix long-term. 2. Acute on chronic systolic CHF:  Ischemic cardiomyopathy.  Echo with EF 20-25%, diffuse hypokinesis, mild to moderate RV dysfunction, moderate pulmonary hypertension, moderate MR.  Was on milrinone for a couple of days, now off.  BP stable, diuresed well overall with weight down 19 lbs compared to admission.   - Continue digoxin and spironolactone.  - Can restart low dose lisinopril, 2.5 mg daily.   - Convert Lasix to po.  - Cannot check co-ox now b/c patient pulled out his PICC.  Would like to avoid long-term milrinone if possible, I think that a continuous infusion would be very difficult for him.   3. RLE DVT: Xarelto x 6 months.  Low risk V/Q scan.  4. Encephalopathy: Improved.  5. Needs ongoing PT, needs SNF placement.  I think he could likely go today.  Cardiac meds for home: Xarelto for DVT, Plavix 75 mg daily, digoxin 0.125 daily, atorvastatin 40 daily, lisinopril 2.5 daily, Lasix 60 po bid, KCl 20 daily, spironolactone 2.5 daily.  Needs follow[facility].    Marca AnconaDalton McLean 12/13/2013 7:30 AM

## 2013-12-13 NOTE — Discharge Summary (Addendum)
Physician Discharge Summary  ORLONDO HOLYCROSS ZOX:[id number] DOB: [birth year] DOA: 11/24/2013  PCP: Samuel Jester, DO  Admit date: 11/24/2013 Discharge date: 12/14/2013  Time spent: >30 minutes  Recommendations for Outpatient Follow-up:  -CBC/BMET to follow Hgb level, WBC's trend, renal function and electrolytes -reassess BP and adjust medications as needed -patient to continue anticoagulation for new DVT will required 6-12 months of anticoagulation.   Discharge Diagnoses:  Active Problems:   HYPERLIPIDEMIA   Essential hypertension, benign   CAD, AUTOLOGOUS BYPASS GRAFT   CARDIOMYOPATHY, ISCHEMIC   NSTEMI (non-ST elevated myocardial infarction)   Acute on chronic systolic heart failure   Hypothyroidism   Left upper lobe pneumonia   Hypotension   Acute delirium   DVT of lower extremity (deep venous thrombosis)   Acute respiratory failure with hypoxia   CKD (chronic kidney disease), stage II   Discharge Condition: stable and improved. Will go to  SNF for physical rehab.   Diet recommendation: low sodium diet   Filed Weights   12/11/13 0618 12/12/13 0548 12/13/13 0502  Weight: 82.1 kg (181 lb) 78.5 kg (173 lb 1 oz) 77.86 kg (171 lb 10.4 oz)    History of present illness:  79 y.o. male with a history of CAD, and ischemic Cardiomyopathy and Chronic systolic CHF who was seen at the [facility] ED due to complaints of SOB, and worsening swelling of his lower legs R>L over the past 2 days. He denied having any Chest pain or fever or chills. He was evaluated in the ED and was found to have a +Troponin level of 3.5, and changes consisted with CHF and possible pneumonia on chest X-ray per the EDP Dr. Dorthey Sawyer. Cardiology was contacted and patient was transferred to [facility] for further Cardiac Evaluation and consultation.    Hospital Course:  1-Acute respiratory failure with hypoxia :  A) Ischemic CM / Acute on chronic systolic heart failure EF 20 %  B) Left upper lobe pneumonia v/s  pulmonary infarct  -On PO lasix 60 mg BID.  -V-Q scan low probability for PE. Anticoagulation for 6-12 months for DVT.  - daily weight and low sodium diet -follow up with PCP and cardiology for further adjustments to medication regimen.  Hyponatremia; suspect secondary to HF.   Hypotension; BP better today in the low 100 range.  -secondary to meds  -I suspect cardiogenic in origin.  -cortisol level 24 normal.   2-Weakness/deconditioning.  -Denies chest pain, dyspnea, abdominal pain, dizziness, no pain. He falls sleep sometimes, in between conversation. Per family and staff this is baseline.  -ABG w/o hypercapnia,  - Resolving/improving -will need physical rehab at discharge. SNF arranged.   Acute delirium  -resolved  - CT head with No acute intracranial abnormalities.   DVT of right lower extremity  -new finding this admission  -Continue with xarelto.  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  -Received 4 days of Levaquin.  -WBC stable has remained.   CKD stage II  -renal fnx stable post cath  -good urine output -close follow up to renal function and electrolytes as an outpatient   Physical deconditioning  -will arrange  for SNF placement for discharge  HYPERLIPIDEMIA  -continue statin  Hx of Essential hypertension, benign  -BP soft - but stable and asymptomatic -will continue medications as recommended by cardiology  Hypothyroidism  -cont Synthroid   Procedures: 2-D echocardiogram  - Procedure narrative: Transthoracic echocardiography. Image quality was adequate. The study was technically  difficult, as a result of poor acoustic windows and poor sound wave transmission. Intravenous contrast (Definity) was administered. - Left ventricle: The cavity size was severely dilated. Wall thickness was normal. Systolic function was severely reduced. The estimated ejection fraction was in the range of 20% to 25%. Diffuse hypokinesis. Indeterminant diastolic function. - Aortic valve: There was no stenosis. Trivial regurgitation. - Mitral valve: Mildly calcified annulus. Mildly calcified leaflets . Moderate central regurgitation. - Left atrium: The atrium was moderately dilated. - Right ventricle: The cavity size was mildly to moderately dilated. Systolic function was mildly reduced. - Right atrium: The atrium was mildly dilated. - Tricuspid valve: Peak RV-RA gradient: 47mm Hg (S). - Pulmonary arteries: PA peak pressure: 62mm Hg (S). - Systemic veins: IVC measured 2.2 cm with < 50% respirophasic variation, suggesting RA pressure 15 mmHg.  Ankle-brachial index  Left ABI suggest mild, borderline arterial insufficiency. Unable to obtain right lower extremity pressures due to acute DVT. Unable to evaluate the right brachial artery due to bandaging.   Lower extremity venous duplex  preliminary results consistent with right lower extremity DVT involving posterior tibial and peroneal veins   Consultations:  Cardiology   Discharge Exam: Filed Vitals:   12/13/13 1004  BP: 88/61  Pulse:   Temp:   Resp:     General: AAOX3, NAD, denies CP or SOB Cardiovascular: positive SEM, no rubs or gallops; regular rate Respiratory: CTA bilaterally  Discharge Instructions  Discharge Orders   Future Orders Complete By Expires   Diet - low sodium heart healthy  As directed    Discharge instructions  As directed    Comments:     Follow a low sodium diet Take medications as prescribed Follow CBC and BMET in 1 week to assess Hgb level, renal function and electrolytes        Medication List    STOP taking these medications       aspirin 81 MG tablet     predniSONE 20 MG tablet  Commonly known as:  DELTASONE      TAKE these medications       atorvastatin 40 MG tablet  Commonly known as:  LIPITOR  Take 1 tablet (40 mg total) by mouth daily at 6 PM.     clopidogrel 75 MG tablet  Commonly known as:  PLAVIX  Take 75 mg by mouth daily.     colchicine-probenecid 0.5-500 MG per tablet  Take 1/2 by mouth twice daily     digoxin 0.125 MG tablet  Commonly known as:  LANOXIN  Take 1 tablet (0.125 mg total) by mouth daily.     feeding supplement (ENSURE COMPLETE) Liqd  Take 237 mLs by mouth 2 (two) times daily between meals.     furosemide 40 MG tablet  Commonly known as:  LASIX  Take 1.5 tablets (60 mg total) by mouth 2 (two) times daily.     levothyroxine 150 MCG tablet  Commonly known as:  SYNTHROID, LEVOTHROID  Take 150 mcg by mouth daily before breakfast.     levothyroxine 112 MCG tablet  Commonly known as:  SYNTHROID, LEVOTHROID  Take 112 mcg by mouth daily.  lisinopril 2.5 MG tablet  Commonly known as:  PRINIVIL,ZESTRIL  Take 1 tablet (2.5 mg total) by mouth daily. Take 1/2 by mouth daily     nitroGLYCERIN 0.4 MG SL tablet  Commonly known as:  NITROSTAT  Place 0.4 mg under the tongue every 5 (five) minutes as needed.     Rivaroxaban 15 MG Tabs tablet  Commonly known as:  XARELTO  Take 1 tablet (15 mg total) by mouth 2 (two) times daily with a meal.     Rivaroxaban 20 MG Tabs tablet  Commonly known as:  XARELTO  Take 1 tablet (20 mg total) by mouth daily with supper.  Start taking on:  12/28/2013     spironolactone 12.5 mg Tabs tablet  Commonly known as:  ALDACTONE  Take 0.5 tablets (12.5 mg total) by mouth daily.     traMADol 50 MG tablet  Commonly known as:  ULTRAM  Take 1 tablet (50 mg total) by mouth every 6 (six) hours as needed for moderate pain or severe pain.       Allergies  Allergen Reactions  . Celecoxib      Unknown   . Codeine     Unknown   . Iohexol     Unknown        Follow-up Information   Follow up with BUTLER, CYNTHIA, DO. Schedule an appointment as soon as possible for a visit in 1 week.   Contact information:   [address] [place] [address] [phone]        The results of significant diagnostics from this hospitalization (including imaging, microbiology, ancillary and laboratory) are listed below for reference.    Significant Diagnostic Studies: Dg Chest 2 View  12/09/2013   CLINICAL DATA:  History of CHF.  Scheduled for VQ scan.  EXAM: CHEST  2 VIEW  COMPARISON:  12/02/2013  FINDINGS: PICC line tip in the upper SVC region. Again noted are patchy parenchymal lung densities, particularly in the left upper lung. There has been mild improvement of the airspace densities compared to the previous examination. Heart size is upper limits of normal. Chronic calcification along the left ventricle may be related to a previous infarct.  IMPRESSION: Patchy parenchymal lung densities, left side greater than right. Findings may represent acute on chronic disease. Mild improvement since the prior examination.  PICC line tip in the upper SVC region.   Electronically Signed   By: Richarda Overlie M.D.   On: 12/09/2013 12:39   Ct Head Wo Contrast  12/08/2013   CLINICAL DATA:  Altered mental status. Persistent confusion of unknown etiology.  EXAM: CT HEAD WITHOUT CONTRAST  TECHNIQUE: Contiguous axial images were obtained from the base of the skull through the vertex without intravenous contrast.  COMPARISON:  None.  FINDINGS: There is no evidence of acute cortical infarct, intracranial hemorrhage, mass, midline shift, or extra-axial fluid collection. Encephalomalacia is present anteriorly in the left temporal lobe. There is mild to moderate generalized cerebral atrophy. Patchy periventricular white matter to hypodensities are nonspecific but compatible with mild chronic small vessel ischemic disease.   Orbits are unremarkable. Visualized mastoid air cells and paranasal sinuses are clear.  IMPRESSION: 1. No evidence of acute intracranial abnormality. 2. Mild chronic small vessel ischemic disease and cerebral atrophy. Left temporal lobe encephalomalacia may reflect sequelae of prior trauma or old infarct.   Electronically Signed   By: Sebastian Ache   On: 12/08/2013 14:40   Nm Pulmonary Perf And Vent  12/09/2013  CLINICAL DATA:  Evaluate for pulmonary embolism. Shortness of breath.  EXAM: NUCLEAR MEDICINE VENTILATION - PERFUSION LUNG SCAN  TECHNIQUE: Ventilation images were obtained in multiple projections using inhaled aerosol technetium 99 M DTPA. Perfusion images were obtained in multiple projections after intravenous injection of Tc-78m MAA.  COMPARISON:  Chest radiograph 12/09/2013  RADIOPHARMACEUTICALS:  40 mCi Tc-49m DTPA aerosol and 6 mCi Tc-77m MAA  FINDINGS: The perfusion images demonstrate a few patchy areas but no significant peripheral or wedge-shaped defects. In addition, there is no significant ventilation-perfusion mismatch. There is some clumping of the radiopharmaceutical on the ventilation images.  IMPRESSION: Low probability for pulmonary embolism.   Electronically Signed   By: Richarda Overlie M.D.   On: 12/09/2013 14:30   Dg Chest Port 1 View  12/02/2013   CLINICAL DATA:  Line placement.  EXAM: PORTABLE CHEST - 1 VIEW  COMPARISON:  DG CHEST 1V PORT dated 11/26/2013  FINDINGS: A right-sided PICC line terminates at the mid to low SVC. No pneumothorax.  Midline trachea. Prior median sternotomy. Cardiomegaly accentuated by AP portable technique. No pleural fluid. Moderate interstitial edema. Patchy right base and left upper lobe airspace disease is similar.  IMPRESSION: Right-sided PICC line terminating at mid to low SVC, without pneumothorax.  Moderate congestive heart failure unchanged.  Patchy bilateral airspace disease. Favor alveolar edema. Infection could look similar.   Electronically Signed    By: Jeronimo Greaves M.D.   On: 12/02/2013 18:33   Dg Chest Port 1 View  11/26/2013   CLINICAL DATA:  Chest pain and congestive heart failure.  EXAM: PORTABLE CHEST - 1 VIEW  COMPARISON:  11/25/2013  FINDINGS: Previous median sternotomy and CABG procedure. The heart size is moderately enlarged. There is persistent airspace consolidation within the left upper lobe. Mild diffuse edema is identified elsewhere. Calcification within the left ventricular apex is noted which may be the sequelae of prior infarct  IMPRESSION: 1. Left upper lobe pneumonia. 2. Superimposed pulmonary edema compatible with CHF.   Electronically Signed   By: Signa Kell M.D.   On: 11/26/2013 10:55   Dg Chest Port 1 View  11/25/2013   CLINICAL DATA:  Congestive heart failure. Hypotension. Coronary artery disease.  EXAM: PORTABLE CHEST - 1 VIEW  COMPARISON:  11/24/13  FINDINGS: Left upper lobe airspace disease shows no significant interval change. Cardiomegaly and pulmonary vascular congestion are stable. No evidence of pleural effusion. Prior CABG again noted.  IMPRESSION: No significant change in left upper lobe airspace disease, suspicious for pneumonia.   Electronically Signed   By: Myles Rosenthal M.D.   On: 11/25/2013 20:14   Labs: Basic Metabolic Panel:  Recent Labs Lab 12/08/13 1314 12/09/13 0455 12/09/13 1430 12/11/13 0600 12/12/13 0650 12/13/13 0541  NA 136* 133*  --  128* 128* 127*  K 4.4 3.9  --  4.1 4.2 3.8  CL 98 94*  --  95* 91* 89*  CO2 25 25  --  21 24 24   GLUCOSE 93 80  --  96 95 102*  BUN 29* 27*  --  24* 22 27*  CREATININE 0.85 0.80  --  0.69 0.82 0.73  CALCIUM 9.0 8.9  --  8.4 8.7 8.7  MG  --   --  1.9  --   --   --    CBC:  Recent Labs Lab 12/08/13 1258 12/09/13 0455 12/11/13 0600 12/12/13 0650 12/13/13 0541  WBC 13.0* 12.8* 12.4* 12.9* 12.5*  HGB 12.8* 12.3* 12.1* 12.8* 13.0  HCT  37.7* 36.4* 35.9* 37.5* 38.6*  MCV 85.9 86.9 84.9 84.8 84.6  PLT 267 264 295 296 316   Cardiac  Enzymes:  Recent Labs Lab 12/08/13 1400  TROPONINI <0.30   CBG:  Recent Labs Lab 12/08/13 1249  GLUCAP 97    Signed:  Armen PickupManzueta, Khyrin Trevathan E Sheril Hammond  [facility] 12/13/2013, 12:16 PM

## 2013-12-13 NOTE — Progress Notes (Signed)
Update:  Patient's sister went to sign papers [facility] and changed her mind about accepting the bed. [facility] now [facility]- she will have a bed in the a.m.  Sister signed admission papers.  Per MD- patient is medically stable for d/c. Will complete d/c to facility in the a.m.  Sister is unable to take patient home as her husband is critically ill at home and she is his caregiver.  MD and nursing notified re: above. CSW will assist in the a.m.  Lorri Frederickonna T. West PughCrowder, LCSWA  [phone]

## 2013-12-13 NOTE — Progress Notes (Signed)
Physical Therapy Treatment Patient Details Name: James [name] MRN: [id number] DOB: [birth year] Today's Date: 12/13/2013 Time: 4098-1191 PT Time Calculation (min): 17 min  PT Assessment / Plan / Recommendation  History of Present Illness PHU RECORD is a 79 y.o. male with a history of CAD, and ischemic Cardiomyopathy and Chronic systolic CHF who was seen at the [facility] ED due to complaints of SOB, and worsening swelling of his lower legs  R>L over the past 2 days.  He denied having any Chest pain or fever or chills.   He was evaluated in the ED and was found to have a +Troponin level of 3.5, and changes consisted with CHF and possible pneumonia on chest X-ray per the EDP s/p cath 3/4. RLE DVT   PT Comments   Pt not progressing today due to right knee and foot pain. He reports that he has a h/o gout, question whether it is beginning to flare if this is in fact the case. Pt not following all commands and often switches instructed extremity. Unable to stand or ambulate today. Exercises performed EOB. Highly recommend SNF, PT will continue to follow.   Follow Up Recommendations  SNF;Supervision/Assistance - 24 hour     Does the patient have the potential to tolerate intense rehabilitation     Barriers to Discharge        Equipment Recommendations  Rolling walker with 5" wheels    Recommendations for Other Services    Frequency Min 3X/week   Progress towards PT Goals Progress towards PT goals: Not progressing toward goals - comment (unable to transfer/ ambulate)  Plan Current plan remains appropriate    Precautions / Restrictions Precautions Precautions: Fall Restrictions Weight Bearing Restrictions: No   Pertinent Vitals/Pain VSS    Mobility  Bed Mobility Overal bed mobility: Needs Assistance Bed Mobility: Supine to Sit;Sit to Supine Supine to sit: Supervision;HOB elevated Sit to supine: Min assist General bed mobility comments: pt required use of rail to get to EOB and  had difficulty scooting out. Was able to scoot out with multiple attempts. Could not scoot laterally by himself, required mod A. Min A to get legs back into bed with sit to supine Transfers Overall transfer level: Needs assistance Equipment used: Rolling walker (2 wheeled) Transfers: Sit to/from Stand General transfer comment: attempted sit to stand 3x and pt unable to clear buttocks from bed. Holding right foot off floor due to right foot and knee pain. Said, "I just can't do it" Ambulation/Gait General Gait Details: unable    Exercises General Exercises - Lower Extremity Ankle Circles/Pumps: AROM;Both;10 reps;Seated Long Arc Quad: AROM;Seated;5 reps;Both (with manual resistance) Hip Flexion/Marching: AROM;Both;10 reps;Seated   PT Diagnosis:    PT Problem List:   PT Treatment Interventions:     PT Goals (current goals can now be found in the care plan section) Acute Rehab PT Goals Patient Stated Goal: be able to walk to the mailbox PT Goal Formulation: With patient Time For Goal Achievement: 12/21/13 Potential to Achieve Goals: Fair  Visit Information  Last PT Received On: 12/13/13 Assistance Needed: +2 (transfers and ambulation) History of Present Illness: James [name] is a 79 y.o. male with a history of CAD, and ischemic Cardiomyopathy and Chronic systolic CHF who was seen at the [facility] ED due to complaints of SOB, and worsening swelling of his lower legs  R>L over the past 2 days.  He denied having any Chest pain or fever or chills.   He was evaluated in  the ED and was found to have a +Troponin level of 3.5, and changes consisted with CHF and possible pneumonia on chest X-ray per the EDP s/p cath 3/4. RLE DVT    Subjective Data  Subjective: pt reported that right knee and foot hurt after trying to stand Patient Stated Goal: be able to walk to the mailbox   Cognition  Cognition Arousal/Alertness: Awake/alert Behavior During Therapy: Flat affect Overall Cognitive Status:  Impaired/Different from baseline Area of Impairment: Memory;Safety/judgement;Problem solving;Orientation Orientation Level: Time;Disoriented to Memory: Decreased short-term memory Safety/Judgement: Decreased awareness of deficits;Decreased awareness of safety Problem Solving: Slow processing;Difficulty sequencing;Requires verbal cues;Requires tactile cues;Decreased initiation General Comments: difficult to get pt to follow commands and often switches extremity when he does follow, eg "bend left knee" and pt bends right knee    Balance  Balance Overall balance assessment: Needs assistance  End of Session PT - End of Session Equipment Utilized During Treatment: Gait belt Activity Tolerance: Patient limited by fatigue;Patient limited by pain Patient left: in bed;with call bell/phone within reach Nurse Communication: Mobility status;Other (comment) (leg pain)   GP   Lyanne CoVictoria Mattelyn Imhoff, PT  Acute Rehab Services  [phone]   Lyanne CoManess, Dailee Manalang 12/13/2013, 11:07 AM

## 2013-12-14 LAB — BASIC METABOLIC PANEL
BUN: 26 mg/dL — ABNORMAL HIGH (ref 6–23)
CHLORIDE: 90 meq/L — AB (ref 96–112)
CO2: 25 mEq/L (ref 19–32)
CREATININE: 0.73 mg/dL (ref 0.50–1.35)
Calcium: 8.8 mg/dL (ref 8.4–10.5)
GFR calc Af Amer: >90 mL/min (ref >90)
GFR calc non Af Amer: 87 mL/min — ABNORMAL LOW (ref >90)
GLUCOSE: 103 mg/dL — AB (ref 70–99)
Potassium: 4.4 mEq/L (ref 3.7–5.3)
Sodium: 128 mEq/L — ABNORMAL LOW (ref 137–147)

## 2013-12-14 NOTE — Progress Notes (Signed)
Patient seen and examined. No acute complaints or overnight issues. VSS. Ok to discharge. No changes to medications or instructions from D/C summary done on 12/13/13.  Armen PickupManzueta, Orlondo Holycross E Sweet Jarvis [phone]

## 2013-12-14 NOTE — Progress Notes (Signed)
Occupational Therapy Treatment Patient Details Name: James [name] CommentRobert R Dalpe MRN: [id number] DOB: Mar 13, 1935 Today's Date: 12/14/2013 Time: 1023-1040 OT Time Calculation (min): 17 min  OT Assessment / Plan / Recommendation  History of present illness James [name] CommentRobert R Rodrigues is a 79 y.o. male with a history of CAD, and ischemic Cardiomyopathy and Chronic systolic CHF who [facility] due to complaints of SOB, and worsening swelling of his lower legs  R>L over the past 2 days.  He denied having any Chest pain or fever or chills.   He was evaluated in the ED and was found to have a +Troponin level of 3.5, and changes consisted with CHF and possible pneumonia on chest X-ray per the EDP s/p cath 3/4. RLE DVT   OT comments  Pt making progress with functional goals, but continues to fatigue easily and require increased time to complete tasks. Pt demos cogntive deficits and requires cues for initiation  Follow Up Recommendations  SNF;Supervision/Assistance - 24 hour    Barriers to Discharge   none    Equipment Recommendations  None recommended by OT    Recommendations for Other Services    Frequency Min 2X/week   Progress towards OT Goals Progress towards OT goals: Progressing toward goals  Plan Discharge plan remains appropriate    Precautions / Restrictions Precautions Precautions: Fall Restrictions Weight Bearing Restrictions: No   Pertinent Vitals/Pain Reports pain in R knee, did not rate    ADL  Grooming: Performed;Wash/dry hands;Wash/dry face;Supervision/safety;Set up Where Assessed - Grooming: Unsupported sitting Upper Body Bathing: Simulated;Supervision/safety;Set up Where Assessed - Upper Body Bathing: Unsupported sitting Lower Body Bathing: Moderate assistance;Maximal assistance Where Assessed - Lower Body Bathing: Lean right and/or left;Unsupported sitting Upper Body Dressing: Performed;Supervision/safety;Set up Where Assessed - Upper Body Dressing: Unsupported  sitting Lower Body Dressing: Performed;Moderate assistance Transfers/Ambulation Related to ADLs: Pt refused transfers, sit - stand with RW. required max encouragement to sit EOB for ADLs ADL Comments: Pt fatigues very easily and requird increased time.rest breaks to compleet tasks    OT Diagnosis:    OT Problem List:   OT Treatment Interventions:     OT Goals(current goals can now be found in the care plan section)    Visit Information  Last OT Received On: 12/14/13 History of Present Illness: James [name] CommentRobert R Tellez is a 79 y.o. male with a history of CAD, and ischemic Cardiomyopathy and Chronic systolic CHF who [facility] due to complaints of SOB, and worsening swelling of his lower legs  R>L over the past 2 days.  He denied having any Chest pain or fever or chills.   He was evaluated in the ED and was found to have a +Troponin level of 3.5, and changes consisted with CHF and possible pneumonia on chest X-ray per the EDP s/p cath 3/4. RLE DVT    Subjective Data   " I don't think I can make it"          Cognition  Cognition Arousal/Alertness: Awake/alert Behavior During Therapy: Flat affect Overall Cognitive Status: No family/caregiver present to determine baseline cognitive functioning Area of Impairment: Attention;Memory;Safety/judgement;Following commands Memory: Decreased short-term memory Problem Solving: Slow processing;Difficulty sequencing;Requires verbal cues;Requires tactile cues;Decreased initiation    Mobility  Bed Mobility Overal bed mobility: Needs Assistance Bed Mobility: Supine to Sit;Sit to Supine Supine to sit: Supervision;HOB elevated Sit to supine: Supervision General bed mobility comments: required use of rails and mod verbal/physical cues to initiate Transfers General transfer comment: pt refused  sit - stand/transfers          Balance Balance Overall balance assessment: Needs assistance Sitting-balance support: No upper extremity  supported;Feet supported Sitting balance-Leahy Scale: Normal  End of Session OT - End of Session Activity Tolerance: Patient limited by fatigue Patient left: in bed;with call bell/phone within reach  GO     Galen Manila 12/14/2013, 1:03 PM

## 2013-12-19 NOTE — Clinical Social Work Placement (Addendum)
    Clinical Social Work Department CLINICAL SOCIAL WORK PLACEMENT NOTE 12/19/2013  Patient:  Carolynn CommentJOHNSON,Joe R  Account Number:  [id number] Admit date:  11/24/2013  Clinical Social Worker:  Maryclare LabradorJULIE ANDERSON, Theresia MajorsLCSWA  Date/time:  12/07/2013 02:31 PM  Clinical Social Work is seeking post-discharge placement for this patient at the following level of care:   SKILLED NURSING   (*CSW will update this form in Epic as items are completed)     Patient/family provided with [facility] Department of Clinical Social Work's list of facilities offering this level of care within the geographic area requested by the patient (or if unable, by the patient's family).  12/07/2013  Patient/family informed of their freedom to choose among providers that offer the needed level of care, that participate in Medicare, Medicaid or managed care program needed by the patient, have an available bed and are willing to accept the patient.    Patient/family informed of [facility]' ownership interest in [facility], as well as of the fact that they are under no obligation to receive care at this facility.  PASARR submitted to EDS on 12/07/2013 PASARR number received from [employer] on 12/07/2013  FL2 transmitted to all facilities in geographic area requested by pt/family on  12/07/2013 FL2 transmitted to all facilities within larger geographic area on   Patient informed that his/her managed care company has contracts with or will negotiate with  certain facilities, including the following:   [facility]     Patient/family informed of bed offers received:  12/12/2013 [facility] Physician recommends and patient chooses bed at    [facility] on  12/14/2013 Patient to be transferred to facility by Ambulance  Sharin Mons(PTAR)  The following physician request were entered in Epic:   Additional Comments: 12/14/13  Ok per MD for d/c today to  SNF for short term rehab.  Patient had planned to go home and his sister wanted him to return home but her husband became very sick several days ago and she does not feels that she can manage both patient and her husband at this time.  Patient agrees to SNF and is happy that [facility] is [facility]. He denies any concerns or questions at this time.  His sister signed admission paperwork at the facility and was pleased with her choice. Nursing will call report. CSW signing off. Lorri Frederickonna T. Jiyaan Steinhauser, LCSWA  [phone]

## 2014-02-02 DEATH — deceased

## 2014-09-13 ENCOUNTER — Encounter (HOSPITAL_COMMUNITY): Payer: Self-pay | Admitting: Cardiovascular Disease

## 2014-10-18 ENCOUNTER — Encounter (HOSPITAL_COMMUNITY): Payer: Self-pay | Admitting: Cardiology

## 2015-07-10 IMAGING — CR DG CHEST 1V PORT
1 series · 1 of 1 positions shown · non-contrast
Comparison: DG CHEST 1V PORT dated 11/26/2013

CLINICAL DATA: Line placement.

EXAM:
PORTABLE CHEST - 1 VIEW

[AP]
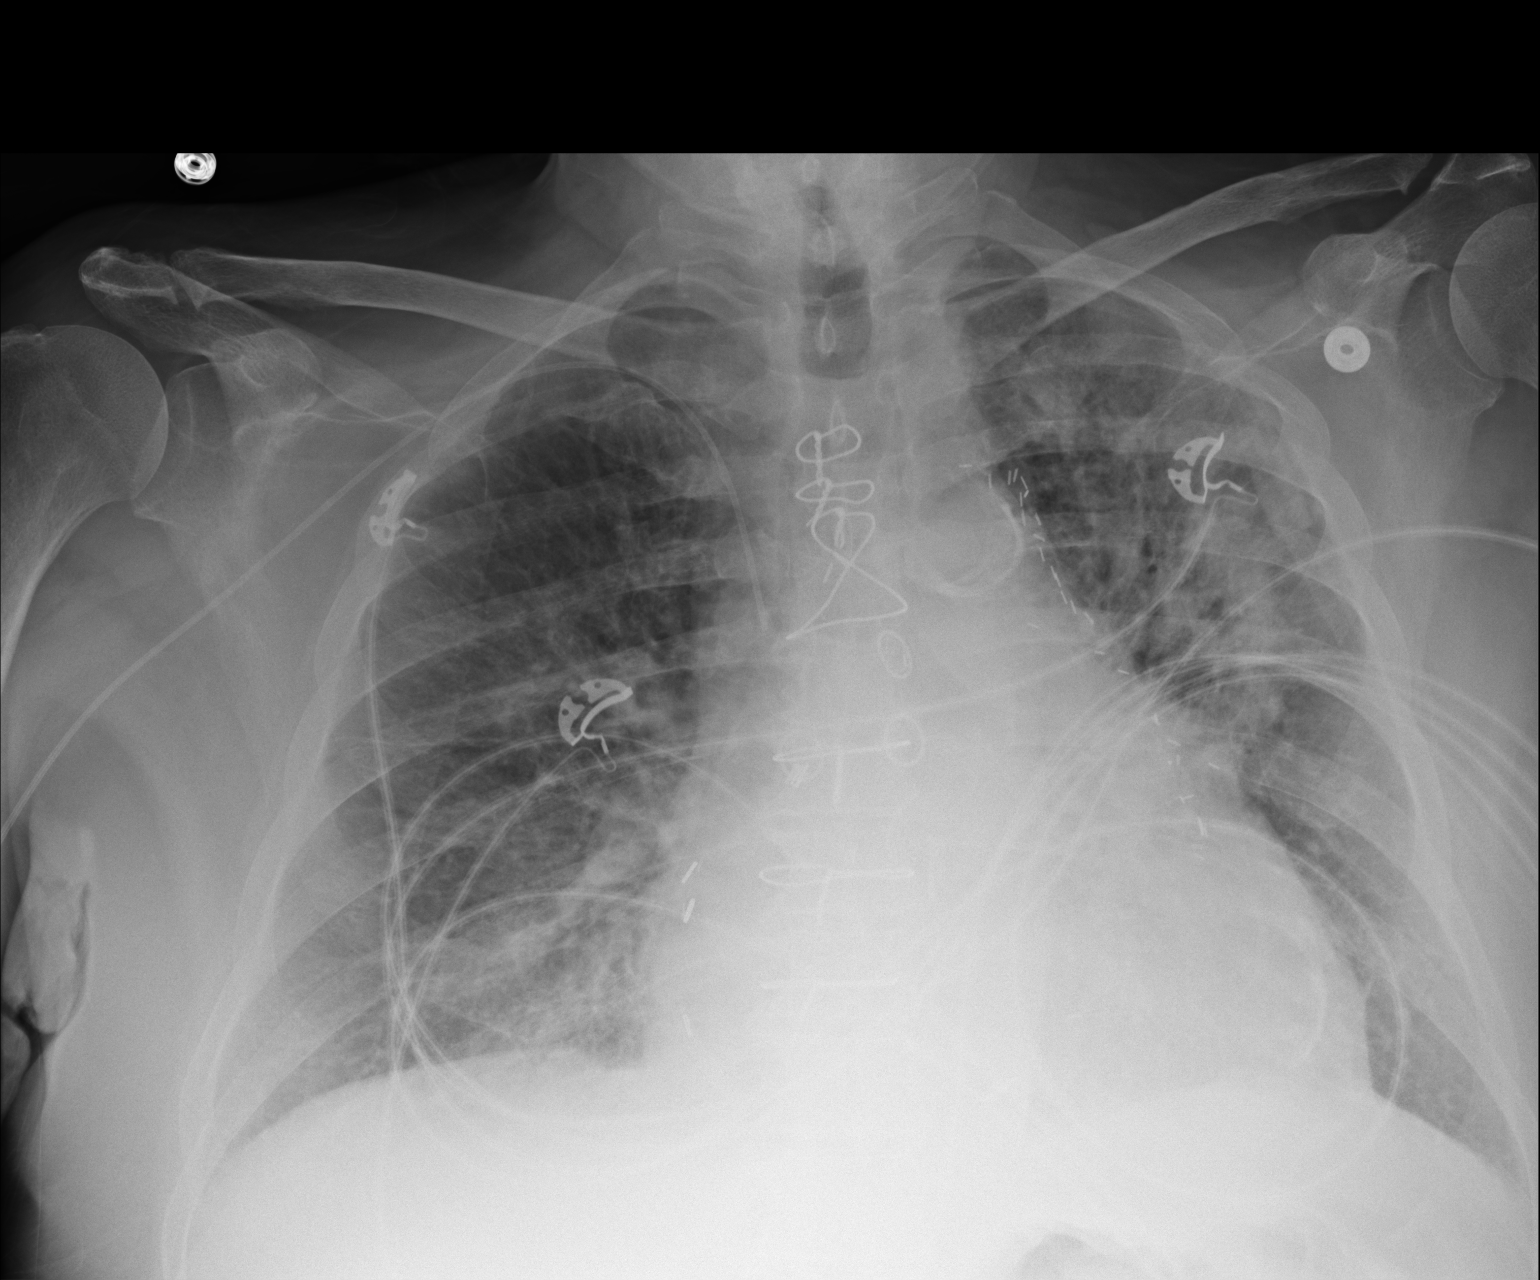

[1 of 1 positions shown; findings below may reference images not displayed]

FINDINGS: A right-sided PICC line terminates at the mid to low SVC. No
pneumothorax.

Midline trachea. Prior median sternotomy. Cardiomegaly accentuated
by AP portable technique. No pleural fluid. Moderate interstitial
edema. Patchy right base and left upper lobe airspace disease is
similar.
IMPRESSION: Right-sided PICC line terminating at mid to low SVC, without
pneumothorax.

Moderate congestive heart failure unchanged.

Patchy bilateral airspace disease. Favor alveolar edema. Infection
could look similar.

## 2015-07-16 IMAGING — CT CT HEAD W/O CM
1 of 4 series · 10 of 30 positions shown, 13 images · non-contrast
Comparison: None.

CLINICAL DATA: Altered mental status. Persistent confusion of
unknown etiology.

EXAM:
CT HEAD WITHOUT CONTRAST
TECHNIQUE: Contiguous axial images were obtained from the base of the skull
through the vertex without intravenous contrast.

[Series 2: head 5.0 h30s · axial · 0.45mm/px · z∈[-115,+15]mm · 10 of 32 slices shown, 13 images]
[im 3/32  brain]
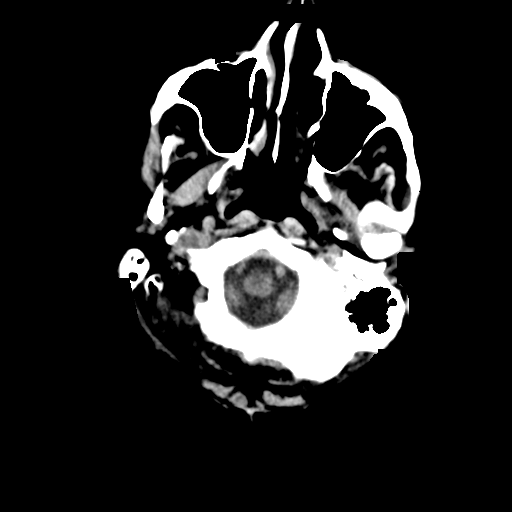
[im 3/32  bone]
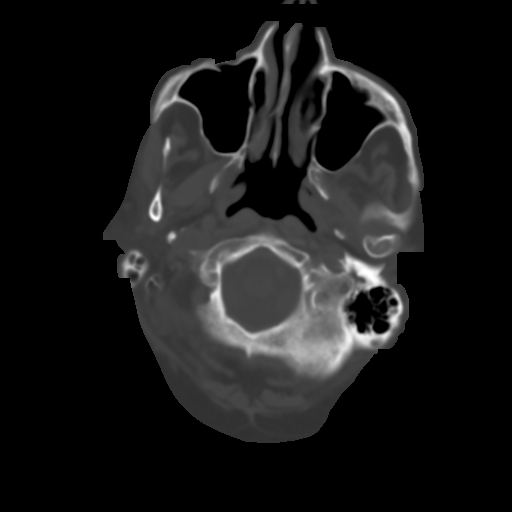
[im 6/32  brain]
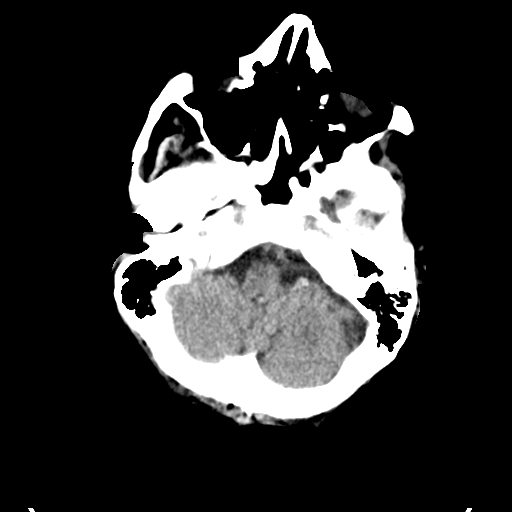
[im 9/32  brain]
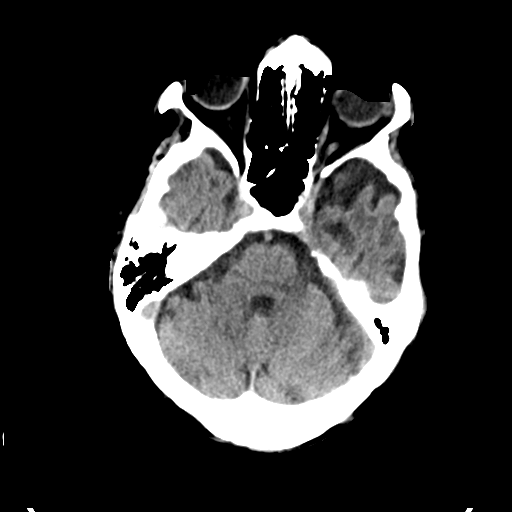
[im 12/32  brain]
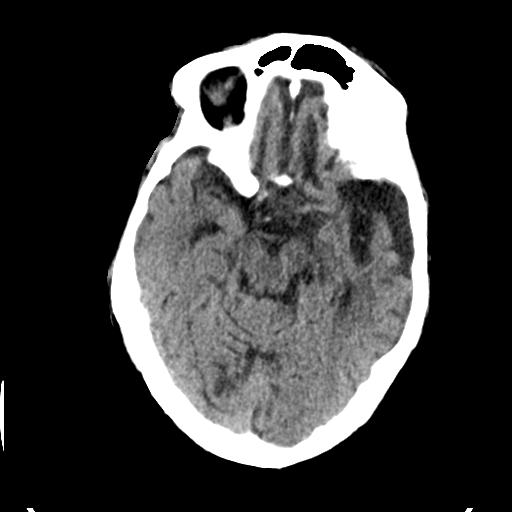
[im 15/32  brain]
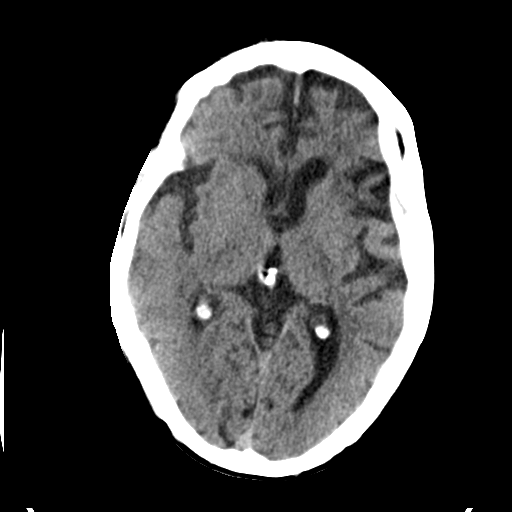
[im 15/32  bone]
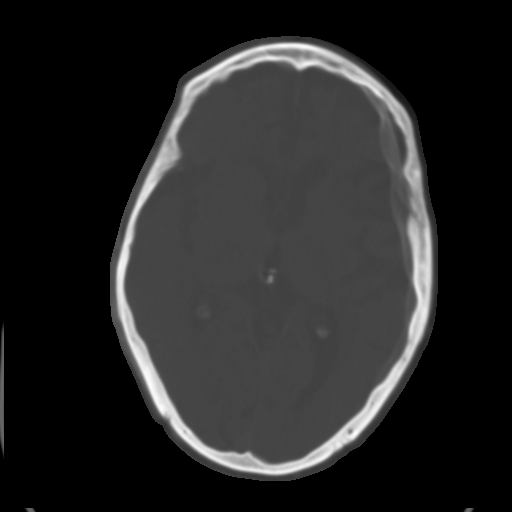
[im 17/32  brain]
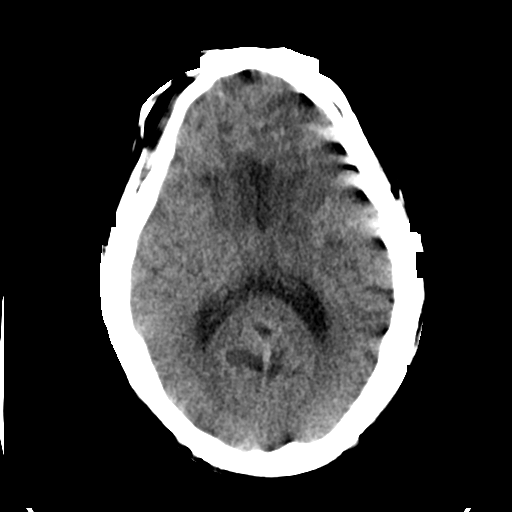
[im 20/32  brain]
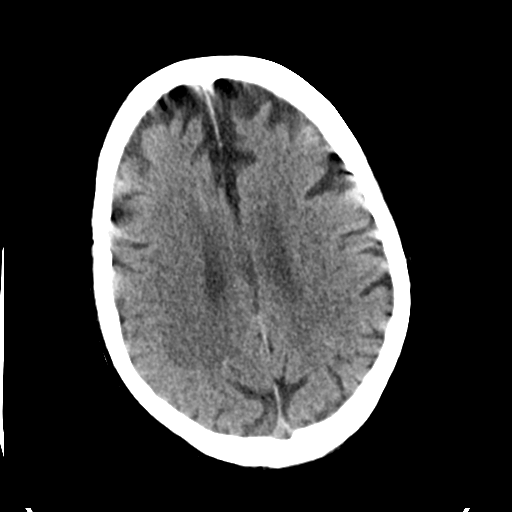
[im 23/32  brain]
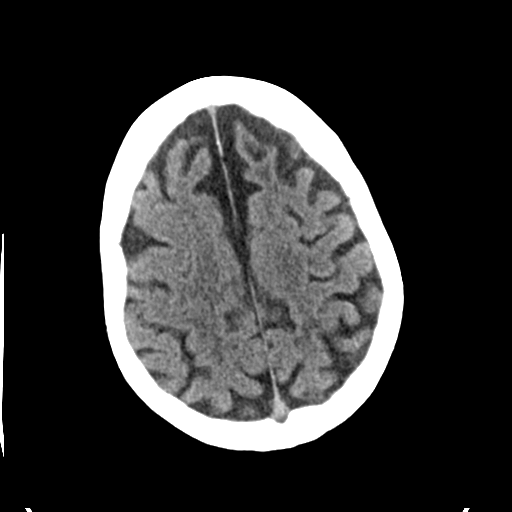
[im 26/32  brain]
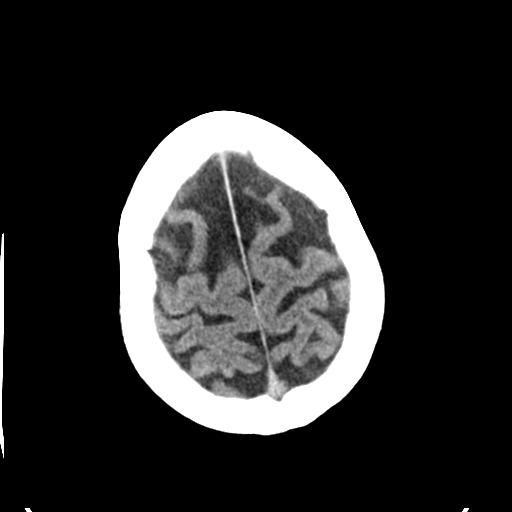
[im 26/32  bone]
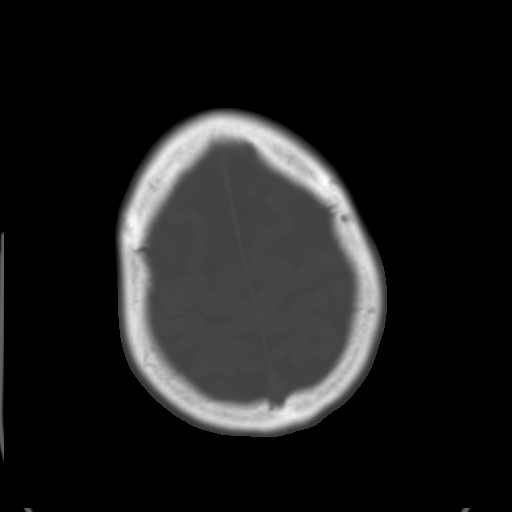
[im 29/32  brain]
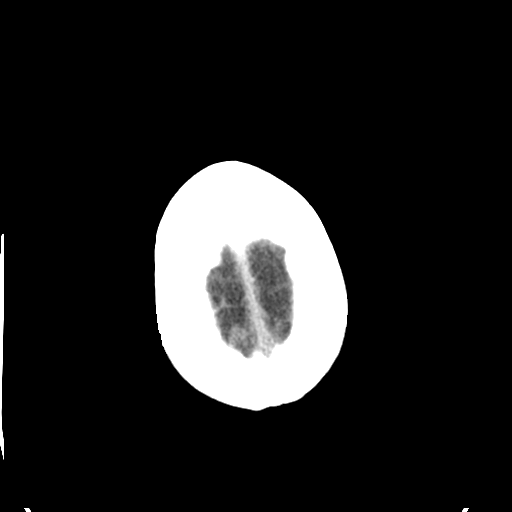

[10 of 30 positions shown; findings below may reference images not displayed]

FINDINGS: There is no evidence of acute cortical infarct, intracranial
hemorrhage, mass, midline shift, or extra-axial fluid collection.
Encephalomalacia is present anteriorly in the left temporal lobe.
There is mild to moderate generalized cerebral atrophy. Patchy
periventricular white matter to hypodensities are nonspecific but
compatible with mild chronic small vessel ischemic disease.

Orbits are unremarkable. Visualized mastoid air cells and paranasal
sinuses are clear.
IMPRESSION: 1. No evidence of acute intracranial abnormality.
2. Mild chronic small vessel ischemic disease and cerebral atrophy.
Left temporal lobe encephalomalacia may reflect sequelae of prior
trauma or old infarct.
